# Patient Record
Sex: Male | Born: 1940
Health system: Southern US, Community
[De-identification: ages and names within clinical notes are randomized; demographics above are authoritative.]

## PROBLEM LIST (undated history)

## (undated) DIAGNOSIS — F419 Anxiety disorder, unspecified: Secondary | ICD-10-CM

## (undated) DIAGNOSIS — K5790 Diverticulosis of intestine, part unspecified, without perforation or abscess without bleeding: Secondary | ICD-10-CM

## (undated) DIAGNOSIS — H9319 Tinnitus, unspecified ear: Secondary | ICD-10-CM

## (undated) DIAGNOSIS — I951 Orthostatic hypotension: Secondary | ICD-10-CM

## (undated) HISTORY — DX: Orthostatic hypotension: I95.1

## (undated) HISTORY — DX: Anxiety disorder, unspecified: F41.9

## (undated) HISTORY — DX: Diverticulosis of intestine, part unspecified, without perforation or abscess without bleeding: K57.90

## (undated) HISTORY — DX: Tinnitus, unspecified ear: H93.19

## (undated) HISTORY — PX: TONSILLECTOMY: SUR1361

---

## 2011-01-05 DIAGNOSIS — Z0000 Encounter for general adult medical examination without abnormal findings: Secondary | ICD-10-CM | POA: Diagnosis not present

## 2011-01-05 DIAGNOSIS — K219 Gastro-esophageal reflux disease without esophagitis: Secondary | ICD-10-CM | POA: Diagnosis not present

## 2011-05-09 DIAGNOSIS — H40019 Open angle with borderline findings, low risk, unspecified eye: Secondary | ICD-10-CM | POA: Diagnosis not present

## 2011-05-09 DIAGNOSIS — H35369 Drusen (degenerative) of macula, unspecified eye: Secondary | ICD-10-CM | POA: Diagnosis not present

## 2011-09-28 DIAGNOSIS — Z23 Encounter for immunization: Secondary | ICD-10-CM | POA: Diagnosis not present

## 2012-01-04 DIAGNOSIS — Z85828 Personal history of other malignant neoplasm of skin: Secondary | ICD-10-CM | POA: Diagnosis not present

## 2012-01-04 DIAGNOSIS — L570 Actinic keratosis: Secondary | ICD-10-CM | POA: Diagnosis not present

## 2012-01-07 ENCOUNTER — Other Ambulatory Visit: Payer: Self-pay | Admitting: Geriatric Medicine

## 2012-01-07 DIAGNOSIS — Z0000 Encounter for general adult medical examination without abnormal findings: Secondary | ICD-10-CM | POA: Diagnosis not present

## 2012-01-07 DIAGNOSIS — R1310 Dysphagia, unspecified: Secondary | ICD-10-CM | POA: Diagnosis not present

## 2012-01-07 DIAGNOSIS — Z136 Encounter for screening for cardiovascular disorders: Secondary | ICD-10-CM | POA: Diagnosis not present

## 2012-01-07 DIAGNOSIS — Z79899 Other long term (current) drug therapy: Secondary | ICD-10-CM | POA: Diagnosis not present

## 2012-01-07 DIAGNOSIS — Z1331 Encounter for screening for depression: Secondary | ICD-10-CM | POA: Diagnosis not present

## 2012-01-09 ENCOUNTER — Ambulatory Visit
Admission: RE | Admit: 2012-01-09 | Discharge: 2012-01-09 | Disposition: A | Discharge status: Home / Self Care | Payer: Medicare Other | Source: Ambulatory Visit | Attending: Geriatric Medicine | Admitting: Geriatric Medicine

## 2012-01-09 DIAGNOSIS — K225 Diverticulum of esophagus, acquired: Secondary | ICD-10-CM | POA: Diagnosis not present

## 2012-01-09 DIAGNOSIS — K449 Diaphragmatic hernia without obstruction or gangrene: Secondary | ICD-10-CM | POA: Diagnosis not present

## 2012-01-09 DIAGNOSIS — R1310 Dysphagia, unspecified: Secondary | ICD-10-CM

## 2012-02-07 DIAGNOSIS — H40019 Open angle with borderline findings, low risk, unspecified eye: Secondary | ICD-10-CM | POA: Diagnosis not present

## 2012-02-21 ENCOUNTER — Other Ambulatory Visit: Payer: Self-pay | Admitting: Gastroenterology

## 2012-02-26 ENCOUNTER — Encounter (HOSPITAL_COMMUNITY): Payer: Self-pay

## 2012-02-26 ENCOUNTER — Encounter (HOSPITAL_COMMUNITY)
Admission: RE | Disposition: A | Discharge status: Home / Self Care | Payer: Self-pay | Source: Ambulatory Visit | Attending: Gastroenterology

## 2012-02-26 ENCOUNTER — Ambulatory Visit (HOSPITAL_COMMUNITY)
Admission: RE | Admit: 2012-02-26 | Discharge: 2012-02-26 | Disposition: A | Discharge status: Home / Self Care | Payer: Medicare Other | Source: Ambulatory Visit | Attending: Gastroenterology | Admitting: Gastroenterology

## 2012-02-26 DIAGNOSIS — K449 Diaphragmatic hernia without obstruction or gangrene: Secondary | ICD-10-CM | POA: Diagnosis not present

## 2012-02-26 DIAGNOSIS — K222 Esophageal obstruction: Secondary | ICD-10-CM | POA: Insufficient documentation

## 2012-02-26 DIAGNOSIS — K225 Diverticulum of esophagus, acquired: Secondary | ICD-10-CM | POA: Insufficient documentation

## 2012-02-26 HISTORY — PX: ESOPHAGOGASTRODUODENOSCOPY: SHX5428

## 2012-02-26 HISTORY — PX: BALLOON DILATION: SHX5330

## 2012-02-26 SURGERY — EGD (ESOPHAGOGASTRODUODENOSCOPY)
Anesthesia: Moderate Sedation

## 2012-02-26 MED ORDER — SODIUM CHLORIDE 0.9 % IV SOLN
INTRAVENOUS | Status: DC
  Administered 2012-02-26: 500 mL via INTRAVENOUS

## 2012-02-26 MED ORDER — MIDAZOLAM HCL 10 MG/2ML IJ SOLN
INTRAMUSCULAR | Status: DC | PRN
  Administered 2012-02-26 (×4): 2.5 mg via INTRAVENOUS

## 2012-02-26 MED ORDER — BUTAMBEN-TETRACAINE-BENZOCAINE 2-2-14 % EX AERO
INHALATION_SPRAY | CUTANEOUS | Status: DC | PRN
  Administered 2012-02-26: 2 via TOPICAL

## 2012-02-26 MED ORDER — FENTANYL CITRATE 0.05 MG/ML IJ SOLN
INTRAMUSCULAR | Status: AC
  Filled 2012-02-26: qty 2

## 2012-02-26 MED ORDER — FENTANYL CITRATE 0.05 MG/ML IJ SOLN
INTRAMUSCULAR | Status: DC | PRN
  Administered 2012-02-26: 25 ug via INTRAVENOUS
  Administered 2012-02-26: 50 ug via INTRAVENOUS

## 2012-02-26 MED ORDER — MIDAZOLAM HCL 10 MG/2ML IJ SOLN
INTRAMUSCULAR | Status: AC
  Filled 2012-02-26: qty 2

## 2012-02-26 NOTE — H&P (Signed)
Problem: Esophageal dysphagia associated with a distal esophageal stricture  History: The patient is a 72 year old male born 1940-05-29. The patient underwent a barium esophagram and barium tablet swallow to evaluate intermittent solid food dysphagia unassociated with odynophagia. His barium esophagram showed a large hiatal hernia, distal esophageal diverticulosis, and a distal esophageal stricture at the esophagogastric junction proximal to the large hiatal hernia.  The patient is scheduled to undergo a diagnostic esophagogastroduodenoscopy with balloon dilation of the distal esophageal stricture.   Medication allergies: None  Past medical history: Normal screening colonoscopy in 2009. Chronic anxiety. Tinnitus associated with hearing loss. Colonic diverticulosis. Tonsillectomy. Cyst removal from his scalp.  Chronic medications: Acetaminophen. Multivitamin.  Habits: The patient does not smoke cigarettes and consumes alcohol in moderation.  Plan: Proceed with diagnostic esophagogastroduodenoscopy and dilation of the distal esophageal stricture.

## 2012-02-26 NOTE — Op Note (Signed)
Procedure: Diagnostic esophagogastroduodenoscopy with balloon dilation of a benign stricture at the esophagogastric junction.  Endoscopist: Danise Edge  Premedication: Fentanyl 75 mcg intravenously. Versed 10 mg intravenously.  Indication: Dysphagia. Barium esophagram showed extensive distal esophageal diverticulosis, a large hiatal hernia, and a short stricture at the esophagogastric junction. The barium tablet would not traverse the distal esophageal stricture.  Procedure: The patient was placed in the left lateral decubitus position. The Pentax gastroscope was passed through the posterior hypopharynx into the proximal esophagus without difficulty. The hypopharynx, larynx, and vocal cords were not visualized.  Esophagoscopy: The proximal and mid segments of the esophageal mucosa appear normal. There are multiple large diverticula in the distal esophagus making it very difficult to identify the esophagogastric junction. There is no endoscopic evidence for the presence of erosive esophagitis or Barrett's esophagus. The esophagogastric junction is noted at approximately 40 cm from the incisor teeth associated with a short segment benign distal esophageal stricture. Using the CRE esophageal balloon dilator, the benign stricture at the esophagogastric junction was dilated to 16.5 millimeters without apparent complications.  Gastroscopy: There is a large hiatal hernia. Retroflexed view of the gastric cardia and fundus was normal. The gastric body, antrum, and pylorus appeared normal.  Duodenoscopy: The duodenal bulb and descending duodenum appeared normal.  Assessment:  #1. Extensive distal esophageal diverticulosis  #2. Large hiatal hernia  #3. Benign stricture at the esophagogastric junction dilated to 16.5 mm using the CRE esophageal balloon dilator.  Recommendations: The patient should start taking omeprazole 20 mg before breakfast each morning indefinitely.

## 2012-02-27 ENCOUNTER — Encounter (HOSPITAL_COMMUNITY): Payer: Self-pay | Admitting: Gastroenterology

## 2012-04-15 DIAGNOSIS — R55 Syncope and collapse: Secondary | ICD-10-CM | POA: Diagnosis not present

## 2012-04-23 DIAGNOSIS — R9431 Abnormal electrocardiogram [ECG] [EKG]: Secondary | ICD-10-CM | POA: Diagnosis not present

## 2012-04-23 DIAGNOSIS — R030 Elevated blood-pressure reading, without diagnosis of hypertension: Secondary | ICD-10-CM | POA: Diagnosis not present

## 2012-04-23 DIAGNOSIS — R55 Syncope and collapse: Secondary | ICD-10-CM | POA: Diagnosis not present

## 2012-05-06 DIAGNOSIS — Z79899 Other long term (current) drug therapy: Secondary | ICD-10-CM | POA: Diagnosis not present

## 2012-05-29 DIAGNOSIS — M25569 Pain in unspecified knee: Secondary | ICD-10-CM | POA: Diagnosis not present

## 2012-06-03 DIAGNOSIS — H40019 Open angle with borderline findings, low risk, unspecified eye: Secondary | ICD-10-CM | POA: Diagnosis not present

## 2012-09-29 DIAGNOSIS — Z23 Encounter for immunization: Secondary | ICD-10-CM | POA: Diagnosis not present

## 2013-01-07 DIAGNOSIS — Z0000 Encounter for general adult medical examination without abnormal findings: Secondary | ICD-10-CM | POA: Diagnosis not present

## 2013-01-07 DIAGNOSIS — Z1331 Encounter for screening for depression: Secondary | ICD-10-CM | POA: Diagnosis not present

## 2013-01-07 DIAGNOSIS — Z79899 Other long term (current) drug therapy: Secondary | ICD-10-CM | POA: Diagnosis not present

## 2013-01-07 DIAGNOSIS — I951 Orthostatic hypotension: Secondary | ICD-10-CM | POA: Diagnosis not present

## 2013-01-07 DIAGNOSIS — K219 Gastro-esophageal reflux disease without esophagitis: Secondary | ICD-10-CM | POA: Diagnosis not present

## 2013-01-07 DIAGNOSIS — R5381 Other malaise: Secondary | ICD-10-CM | POA: Diagnosis not present

## 2013-01-07 DIAGNOSIS — R5383 Other fatigue: Secondary | ICD-10-CM | POA: Diagnosis not present

## 2013-02-05 DIAGNOSIS — H40029 Open angle with borderline findings, high risk, unspecified eye: Secondary | ICD-10-CM | POA: Diagnosis not present

## 2013-06-22 DIAGNOSIS — H40029 Open angle with borderline findings, high risk, unspecified eye: Secondary | ICD-10-CM | POA: Diagnosis not present

## 2013-09-09 DIAGNOSIS — N183 Chronic kidney disease, stage 3 unspecified: Secondary | ICD-10-CM | POA: Diagnosis not present

## 2013-09-09 DIAGNOSIS — I951 Orthostatic hypotension: Secondary | ICD-10-CM | POA: Diagnosis not present

## 2013-09-09 DIAGNOSIS — D539 Nutritional anemia, unspecified: Secondary | ICD-10-CM | POA: Diagnosis not present

## 2013-09-10 DIAGNOSIS — C44319 Basal cell carcinoma of skin of other parts of face: Secondary | ICD-10-CM | POA: Diagnosis not present

## 2013-09-10 DIAGNOSIS — D485 Neoplasm of uncertain behavior of skin: Secondary | ICD-10-CM | POA: Diagnosis not present

## 2013-09-10 DIAGNOSIS — Z85828 Personal history of other malignant neoplasm of skin: Secondary | ICD-10-CM | POA: Diagnosis not present

## 2013-09-10 DIAGNOSIS — L821 Other seborrheic keratosis: Secondary | ICD-10-CM | POA: Diagnosis not present

## 2013-09-10 DIAGNOSIS — D1801 Hemangioma of skin and subcutaneous tissue: Secondary | ICD-10-CM | POA: Diagnosis not present

## 2013-10-02 DIAGNOSIS — I951 Orthostatic hypotension: Secondary | ICD-10-CM | POA: Diagnosis not present

## 2013-10-19 DIAGNOSIS — C44311 Basal cell carcinoma of skin of nose: Secondary | ICD-10-CM | POA: Diagnosis not present

## 2013-10-19 DIAGNOSIS — Z85828 Personal history of other malignant neoplasm of skin: Secondary | ICD-10-CM | POA: Diagnosis not present

## 2013-12-30 ENCOUNTER — Encounter: Payer: Self-pay | Admitting: *Deleted

## 2014-01-08 DIAGNOSIS — I951 Orthostatic hypotension: Secondary | ICD-10-CM | POA: Diagnosis not present

## 2014-01-08 DIAGNOSIS — Z1389 Encounter for screening for other disorder: Secondary | ICD-10-CM | POA: Diagnosis not present

## 2014-01-08 DIAGNOSIS — F419 Anxiety disorder, unspecified: Secondary | ICD-10-CM | POA: Diagnosis not present

## 2014-01-08 DIAGNOSIS — Z23 Encounter for immunization: Secondary | ICD-10-CM | POA: Diagnosis not present

## 2014-01-08 DIAGNOSIS — H9193 Unspecified hearing loss, bilateral: Secondary | ICD-10-CM | POA: Diagnosis not present

## 2014-01-08 DIAGNOSIS — N183 Chronic kidney disease, stage 3 (moderate): Secondary | ICD-10-CM | POA: Diagnosis not present

## 2014-01-08 DIAGNOSIS — D649 Anemia, unspecified: Secondary | ICD-10-CM | POA: Diagnosis not present

## 2014-01-08 DIAGNOSIS — Z0000 Encounter for general adult medical examination without abnormal findings: Secondary | ICD-10-CM | POA: Diagnosis not present

## 2014-01-08 DIAGNOSIS — Z79899 Other long term (current) drug therapy: Secondary | ICD-10-CM | POA: Diagnosis not present

## 2014-01-13 DIAGNOSIS — H903 Sensorineural hearing loss, bilateral: Secondary | ICD-10-CM | POA: Diagnosis not present

## 2014-01-13 DIAGNOSIS — H9313 Tinnitus, bilateral: Secondary | ICD-10-CM | POA: Diagnosis not present

## 2014-02-02 DIAGNOSIS — H40023 Open angle with borderline findings, high risk, bilateral: Secondary | ICD-10-CM | POA: Diagnosis not present

## 2014-03-10 DIAGNOSIS — Z79899 Other long term (current) drug therapy: Secondary | ICD-10-CM | POA: Diagnosis not present

## 2014-04-20 DIAGNOSIS — Z85828 Personal history of other malignant neoplasm of skin: Secondary | ICD-10-CM | POA: Diagnosis not present

## 2014-07-09 DIAGNOSIS — Z79899 Other long term (current) drug therapy: Secondary | ICD-10-CM | POA: Diagnosis not present

## 2014-07-09 DIAGNOSIS — I951 Orthostatic hypotension: Secondary | ICD-10-CM | POA: Diagnosis not present

## 2014-07-09 DIAGNOSIS — N183 Chronic kidney disease, stage 3 (moderate): Secondary | ICD-10-CM | POA: Diagnosis not present

## 2014-07-09 DIAGNOSIS — D72823 Leukemoid reaction: Secondary | ICD-10-CM | POA: Diagnosis not present

## 2014-07-12 DIAGNOSIS — H40023 Open angle with borderline findings, high risk, bilateral: Secondary | ICD-10-CM | POA: Diagnosis not present

## 2014-08-02 DIAGNOSIS — H4011X1 Primary open-angle glaucoma, mild stage: Secondary | ICD-10-CM | POA: Diagnosis not present

## 2014-10-08 DIAGNOSIS — Z23 Encounter for immunization: Secondary | ICD-10-CM | POA: Diagnosis not present

## 2015-02-11 DIAGNOSIS — Z79899 Other long term (current) drug therapy: Secondary | ICD-10-CM | POA: Diagnosis not present

## 2015-02-11 DIAGNOSIS — Z1389 Encounter for screening for other disorder: Secondary | ICD-10-CM | POA: Diagnosis not present

## 2015-02-11 DIAGNOSIS — Z0000 Encounter for general adult medical examination without abnormal findings: Secondary | ICD-10-CM | POA: Diagnosis not present

## 2015-02-11 DIAGNOSIS — I951 Orthostatic hypotension: Secondary | ICD-10-CM | POA: Diagnosis not present

## 2015-02-11 DIAGNOSIS — N183 Chronic kidney disease, stage 3 (moderate): Secondary | ICD-10-CM | POA: Diagnosis not present

## 2015-02-11 DIAGNOSIS — R7309 Other abnormal glucose: Secondary | ICD-10-CM | POA: Diagnosis not present

## 2015-02-11 DIAGNOSIS — D72823 Leukemoid reaction: Secondary | ICD-10-CM | POA: Diagnosis not present

## 2015-03-15 DIAGNOSIS — H401112 Primary open-angle glaucoma, right eye, moderate stage: Secondary | ICD-10-CM | POA: Diagnosis not present

## 2015-03-15 DIAGNOSIS — H401121 Primary open-angle glaucoma, left eye, mild stage: Secondary | ICD-10-CM | POA: Diagnosis not present

## 2015-04-28 DIAGNOSIS — Z85828 Personal history of other malignant neoplasm of skin: Secondary | ICD-10-CM | POA: Diagnosis not present

## 2015-04-28 DIAGNOSIS — D2262 Melanocytic nevi of left upper limb, including shoulder: Secondary | ICD-10-CM | POA: Diagnosis not present

## 2015-04-28 DIAGNOSIS — L570 Actinic keratosis: Secondary | ICD-10-CM | POA: Diagnosis not present

## 2015-04-28 DIAGNOSIS — D2261 Melanocytic nevi of right upper limb, including shoulder: Secondary | ICD-10-CM | POA: Diagnosis not present

## 2015-04-28 DIAGNOSIS — L738 Other specified follicular disorders: Secondary | ICD-10-CM | POA: Diagnosis not present

## 2015-04-28 DIAGNOSIS — L821 Other seborrheic keratosis: Secondary | ICD-10-CM | POA: Diagnosis not present

## 2015-04-29 ENCOUNTER — Encounter (HOSPITAL_COMMUNITY): Payer: Self-pay | Admitting: Emergency Medicine

## 2015-04-29 ENCOUNTER — Emergency Department (HOSPITAL_COMMUNITY): Payer: Medicare Other

## 2015-04-29 ENCOUNTER — Other Ambulatory Visit: Payer: Self-pay | Admitting: Geriatric Medicine

## 2015-04-29 ENCOUNTER — Inpatient Hospital Stay (HOSPITAL_COMMUNITY)
Admission: EM | Admit: 2015-04-29 | Discharge: 2015-05-05 | DRG: 409 | Disposition: A | Discharge status: Home / Self Care | Payer: Medicare Other | Attending: Internal Medicine | Admitting: Internal Medicine

## 2015-04-29 DIAGNOSIS — I951 Orthostatic hypotension: Secondary | ICD-10-CM | POA: Diagnosis present

## 2015-04-29 DIAGNOSIS — Z419 Encounter for procedure for purposes other than remedying health state, unspecified: Secondary | ICD-10-CM

## 2015-04-29 DIAGNOSIS — Z87891 Personal history of nicotine dependence: Secondary | ICD-10-CM | POA: Diagnosis not present

## 2015-04-29 DIAGNOSIS — K8064 Calculus of gallbladder and bile duct with chronic cholecystitis without obstruction: Secondary | ICD-10-CM | POA: Diagnosis not present

## 2015-04-29 DIAGNOSIS — F419 Anxiety disorder, unspecified: Secondary | ICD-10-CM | POA: Diagnosis present

## 2015-04-29 DIAGNOSIS — R8271 Bacteriuria: Secondary | ICD-10-CM | POA: Diagnosis not present

## 2015-04-29 DIAGNOSIS — Z823 Family history of stroke: Secondary | ICD-10-CM

## 2015-04-29 DIAGNOSIS — K5730 Diverticulosis of large intestine without perforation or abscess without bleeding: Secondary | ICD-10-CM | POA: Diagnosis present

## 2015-04-29 DIAGNOSIS — K8020 Calculus of gallbladder without cholecystitis without obstruction: Secondary | ICD-10-CM

## 2015-04-29 DIAGNOSIS — Z7952 Long term (current) use of systemic steroids: Secondary | ICD-10-CM

## 2015-04-29 DIAGNOSIS — H409 Unspecified glaucoma: Secondary | ICD-10-CM | POA: Diagnosis present

## 2015-04-29 DIAGNOSIS — E876 Hypokalemia: Secondary | ICD-10-CM | POA: Diagnosis not present

## 2015-04-29 DIAGNOSIS — R1013 Epigastric pain: Secondary | ICD-10-CM | POA: Diagnosis not present

## 2015-04-29 DIAGNOSIS — H9319 Tinnitus, unspecified ear: Secondary | ICD-10-CM | POA: Diagnosis present

## 2015-04-29 DIAGNOSIS — K222 Esophageal obstruction: Secondary | ICD-10-CM | POA: Diagnosis present

## 2015-04-29 DIAGNOSIS — D6489 Other specified anemias: Secondary | ICD-10-CM | POA: Diagnosis not present

## 2015-04-29 DIAGNOSIS — Z66 Do not resuscitate: Secondary | ICD-10-CM | POA: Diagnosis present

## 2015-04-29 DIAGNOSIS — Z79899 Other long term (current) drug therapy: Secondary | ICD-10-CM

## 2015-04-29 DIAGNOSIS — K219 Gastro-esophageal reflux disease without esophagitis: Secondary | ICD-10-CM | POA: Diagnosis present

## 2015-04-29 DIAGNOSIS — K449 Diaphragmatic hernia without obstruction or gangrene: Secondary | ICD-10-CM | POA: Diagnosis present

## 2015-04-29 DIAGNOSIS — Q396 Congenital diverticulum of esophagus: Secondary | ICD-10-CM | POA: Diagnosis not present

## 2015-04-29 DIAGNOSIS — K8050 Calculus of bile duct without cholangitis or cholecystitis without obstruction: Secondary | ICD-10-CM

## 2015-04-29 DIAGNOSIS — K859 Acute pancreatitis without necrosis or infection, unspecified: Secondary | ICD-10-CM | POA: Diagnosis present

## 2015-04-29 DIAGNOSIS — R740 Nonspecific elevation of levels of transaminase and lactic acid dehydrogenase [LDH]: Secondary | ICD-10-CM | POA: Diagnosis present

## 2015-04-29 DIAGNOSIS — D649 Anemia, unspecified: Secondary | ICD-10-CM | POA: Insufficient documentation

## 2015-04-29 DIAGNOSIS — K851 Biliary acute pancreatitis without necrosis or infection: Secondary | ICD-10-CM | POA: Diagnosis not present

## 2015-04-29 LAB — CBC
HCT: 37 % — ABNORMAL LOW (ref 39.0–52.0)
Hemoglobin: 12.6 g/dL — ABNORMAL LOW (ref 13.0–17.0)
MCH: 31.1 pg (ref 26.0–34.0)
MCHC: 34.1 g/dL (ref 30.0–36.0)
MCV: 91.4 fL (ref 78.0–100.0)
PLATELETS: 190 10*3/uL (ref 150–400)
RBC: 4.05 MIL/uL — AB (ref 4.22–5.81)
RDW: 13 % (ref 11.5–15.5)
WBC: 15.1 10*3/uL — ABNORMAL HIGH (ref 4.0–10.5)

## 2015-04-29 LAB — COMPREHENSIVE METABOLIC PANEL
ALK PHOS: 396 U/L — AB (ref 38–126)
ALT: 191 U/L — AB (ref 17–63)
AST: 100 U/L — AB (ref 15–41)
Albumin: 4 g/dL (ref 3.5–5.0)
Anion gap: 13 (ref 5–15)
BUN: 24 mg/dL — AB (ref 6–20)
CALCIUM: 9 mg/dL (ref 8.9–10.3)
CO2: 23 mmol/L (ref 22–32)
CREATININE: 1.21 mg/dL (ref 0.61–1.24)
Chloride: 104 mmol/L (ref 101–111)
GFR, EST NON AFRICAN AMERICAN: 58 mL/min — AB (ref >60)
Glucose, Bld: 134 mg/dL — ABNORMAL HIGH (ref 65–99)
Potassium: 3.6 mmol/L (ref 3.5–5.1)
SODIUM: 140 mmol/L (ref 135–145)
Total Bilirubin: 5 mg/dL — ABNORMAL HIGH (ref 0.3–1.2)
Total Protein: 7 g/dL (ref 6.5–8.1)

## 2015-04-29 LAB — URINALYSIS, ROUTINE W REFLEX MICROSCOPIC
Glucose, UA: NEGATIVE mg/dL
HGB URINE DIPSTICK: NEGATIVE
KETONES UR: 15 mg/dL — AB
Nitrite: POSITIVE — AB
PROTEIN: 30 mg/dL — AB
Specific Gravity, Urine: 1.034 — ABNORMAL HIGH (ref 1.005–1.030)
pH: 5.5 (ref 5.0–8.0)

## 2015-04-29 LAB — URINE MICROSCOPIC-ADD ON: Squamous Epithelial / LPF: NONE SEEN

## 2015-04-29 LAB — LIPASE, BLOOD: LIPASE: 983 U/L — AB (ref 11–51)

## 2015-04-29 MED ORDER — SODIUM CHLORIDE 0.9 % IV BOLUS (SEPSIS)
1000.0000 mL | Freq: Once | INTRAVENOUS | Status: AC
  Administered 2015-04-29: 1000 mL via INTRAVENOUS

## 2015-04-29 MED ORDER — DIATRIZOATE MEGLUMINE & SODIUM 66-10 % PO SOLN
15.0000 mL | Freq: Once | ORAL | Status: AC
  Administered 2015-04-29: 30 mL via ORAL

## 2015-04-29 MED ORDER — IOPAMIDOL (ISOVUE-300) INJECTION 61%
100.0000 mL | Freq: Once | INTRAVENOUS | Status: AC | PRN
Start: 2015-04-29 — End: 2015-04-29
  Administered 2015-04-29: 100 mL via INTRAVENOUS

## 2015-04-29 MED ORDER — PIPERACILLIN-TAZOBACTAM 3.375 G IVPB 30 MIN
3.3750 g | Freq: Once | INTRAVENOUS | Status: AC
  Administered 2015-04-29: 3.375 g via INTRAVENOUS
  Filled 2015-04-29: qty 50

## 2015-04-29 NOTE — ED Provider Notes (Signed)
CSN: [id number]     Arrival date & time 04/29/15  1810 History   First MD Initiated Contact with Patient 04/29/15 2145     Chief Complaint  Patient presents with  . Abdominal Pain     The history is provided by the patient. No language interpreter was used.   Bobby [name] is a 75 y.o. male who presents to the Emergency Department complaining of abdominal pain.  He's had 2 weeks of intermittent abdominal pain, worse over the last couple of days. Pain is predominantly across the upper abdomen. He's had shaking chills and nausea. He saw his PCP today and had blood work drawn and was told to present to the emergency department for further evaluation. No reports of fevers, diarrhea, dysuria, vomiting.  Past Medical History  Diagnosis Date  . Anxiety   . Tinnitus   . Diverticulosis   . Orthostatic hypotension    Past Surgical History  Procedure Laterality Date  . Tonsillectomy    . Esophagogastroduodenoscopy N/A 02/26/2012    Procedure: ESOPHAGOGASTRODUODENOSCOPY (EGD);  Surgeon: Garlan Fair, MD;  Location: [facility];  Service: Endoscopy;  Laterality: N/A;  . Balloon dilation N/A 02/26/2012    Procedure: BALLOON DILATION;  Surgeon: Garlan Fair, MD;  Location: [facility];  Service: Endoscopy;  Laterality: N/A;   Family History  Problem Relation Age of Onset  . Cancer Mother    Social History  Substance Use Topics  . Smoking status: Former Research scientist (life sciences)  . Smokeless tobacco: None  . Alcohol Use: 1.8 oz/week    3 Shots of liquor per week     Comment: 3 drinks per day    Review of Systems  All other systems reviewed and are negative.     Allergies  Review of patient's allergies indicates no known allergies.  Home Medications   Prior to Admission medications   Medication Sig Start Date End Date Taking? Authorizing Provider  acetaminophen (TYLENOL) 500 MG tablet Take 1,000-1,500 mg by mouth 2 (two) times daily.    Yes Historical Provider, MD  cholecalciferol (VITAMIN  D) 1000 units tablet Take 1,000 Units by mouth daily.   Yes Historical Provider, MD  fludrocortisone (FLORINEF) 0.1 MG tablet Take 0.1 mg by mouth every morning. 03/30/15  Yes Historical Provider, MD  Multiple Vitamin (MULTIVITAMIN WITH MINERALS) TABS tablet Take 1 tablet by mouth daily.   Yes Historical Provider, MD  omeprazole (PRILOSEC) 20 MG capsule Take 20 mg by mouth daily with breakfast. 04/25/15  Yes Historical Provider, MD  travoprost, benzalkonium, (TRAVATAN) 0.004 % ophthalmic solution Place 1 drop into both eyes at bedtime.   Yes Historical Provider, MD   BP 162/76 mmHg  Pulse 58  Temp(Src) 98.7 F (37.1 C) (Oral)  Resp 20  SpO2 99% Physical Exam  Constitutional: He is oriented to person, place, and time. He appears well-developed and well-nourished.  HENT:  Head: Normocephalic and atraumatic.  Eyes: Scleral icterus is present.  Cardiovascular: Normal rate and regular rhythm.   No murmur heard. Pulmonary/Chest: Effort normal and breath sounds normal. No respiratory distress.  Abdominal: Soft. There is no tenderness. There is no rebound and no guarding.  Musculoskeletal: He exhibits no edema or tenderness.  Neurological: He is alert and oriented to person, place, and time.  Skin: Skin is warm and dry.  Psychiatric: He has a normal mood and affect. His behavior is normal.  Nursing note and vitals reviewed.   ED Course  Procedures (including critical care time) Labs Review Labs Reviewed  COMPREHENSIVE METABOLIC PANEL - Abnormal; Notable for the following:    Glucose, Bld 134 (*)    BUN 24 (*)    AST 100 (*)    ALT 191 (*)    Alkaline Phosphatase 396 (*)    Total Bilirubin 5.0 (*)    GFR calc non Af Amer 58 (*)    All other components within normal limits  CBC - Abnormal; Notable for the following:    WBC 15.1 (*)    RBC 4.05 (*)    Hemoglobin 12.6 (*)    HCT 37.0 (*)    All other components within normal limits  URINALYSIS, ROUTINE W REFLEX MICROSCOPIC (NOT AT  [facility]) - Abnormal; Notable for the following:    Color, Urine ORANGE (*)    APPearance TURBID (*)    Specific Gravity, Urine 1.034 (*)    Bilirubin Urine LARGE (*)    Ketones, ur 15 (*)    Protein, ur 30 (*)    Nitrite POSITIVE (*)    Leukocytes, UA SMALL (*)    All other components within normal limits  LIPASE, BLOOD - Abnormal; Notable for the following:    Lipase 983 (*)    All other components within normal limits  URINE MICROSCOPIC-ADD ON - Abnormal; Notable for the following:    Bacteria, UA FEW (*)    All other components within normal limits    Imaging Review Ct Abdomen Pelvis W Contrast  04/29/2015  CLINICAL DATA:  Subacute onset of generalized abdominal pain. Initial encounter. EXAM: CT ABDOMEN AND PELVIS WITH CONTRAST TECHNIQUE: Multidetector CT imaging of the abdomen and pelvis was performed using the standard protocol following bolus administration of intravenous contrast. CONTRAST:  138mL ISOVUE-300 IOPAMIDOL (ISOVUE-300) INJECTION 61% COMPARISON:  None. FINDINGS: The visualized lung bases are clear. There appears to be a moderate diverticulum arising from a small hiatal hernia. There is mild intrahepatic biliary duct dilatation. Multiple large stones are seen within the gallbladder. There is dilatation of the common bile duct to 1.3 cm, with 3 stones seen in the common bile duct, concerning for obstruction. The pancreas and adrenal glands are unremarkable. The spleen is grossly unremarkable in appearance. The kidneys are unremarkable in appearance. There is no evidence of hydronephrosis. No renal or ureteral stones are seen. Mild nonspecific perinephric stranding is noted bilaterally. No free fluid is identified. The small bowel is unremarkable in appearance. The stomach is within normal limits. No acute vascular abnormalities are seen. Minimal calcification is seen along the abdominal aorta and its branches. The appendix is normal in caliber and contains trace air, without evidence  of appendicitis. Diffuse diverticulosis is noted along the descending and sigmoid colon, without evidence of diverticulitis. The bladder is mildly distended. Mild bladder wall thickening could reflect cystitis. The prostate remains normal in size, with minimal calcification. No inguinal lymphadenopathy is seen. No acute osseous abnormalities are identified. Mild vacuum phenomenon is noted at L4-L5. IMPRESSION: 1. Dilatation of the common bile duct to 1.3 cm, with 3 stones seen in the common bile duct, concerning for obstruction. ERCP would be helpful for further evaluation, as deemed clinically appropriate. 2. Multiple large stones seen within the gallbladder. Mild intrahepatic biliary ductal dilatation seen. 3. Mild bladder wall thickening could reflect cystitis. Would correlate with the patient's symptoms. 4. Apparent moderate diverticulum seen arising from a small hiatal hernia. 5. Diffuse diverticulosis along the descending and sigmoid colon, without evidence of diverticulitis. Electronically Signed   By: Garald Balding M.D.   On:  04/29/2015 23:38   I have personally reviewed and evaluated these images and lab results as part of my medical decision-making.   EKG Interpretation None      MDM   Final diagnoses:  Acute gallstone pancreatitis  Gallstones    Patient here for progressive abdominal pain, chills. His abdominal pain is waxing and waning and has known department on evaluation. Labs are consistent with pancreatitis. CT abdomen demonstrates multiple obstructing stones. Patient was started on Zosyn for his shaking chills and leukocytosis, concerning for potential infection. Discussed with Dr. Bobbe Medico with [facility] who agrees to see the patient in consult. Discussed with hospitalist regarding admission for further treatment.    Quintella Reichert, MD 04/30/15 [phone]

## 2015-04-29 NOTE — ED Notes (Signed)
Per pt, states abdominal pain on and off for 2 weeks-went to PCP and was told his blood work was terrible

## 2015-04-30 ENCOUNTER — Encounter (HOSPITAL_COMMUNITY): Payer: Self-pay | Admitting: Internal Medicine

## 2015-04-30 ENCOUNTER — Emergency Department (HOSPITAL_COMMUNITY): Payer: Medicare Other

## 2015-04-30 DIAGNOSIS — K5730 Diverticulosis of large intestine without perforation or abscess without bleeding: Secondary | ICD-10-CM | POA: Diagnosis present

## 2015-04-30 DIAGNOSIS — K449 Diaphragmatic hernia without obstruction or gangrene: Secondary | ICD-10-CM | POA: Diagnosis present

## 2015-04-30 DIAGNOSIS — K8020 Calculus of gallbladder without cholecystitis without obstruction: Secondary | ICD-10-CM | POA: Diagnosis not present

## 2015-04-30 DIAGNOSIS — I951 Orthostatic hypotension: Secondary | ICD-10-CM | POA: Diagnosis not present

## 2015-04-30 DIAGNOSIS — R1013 Epigastric pain: Secondary | ICD-10-CM | POA: Diagnosis not present

## 2015-04-30 DIAGNOSIS — E876 Hypokalemia: Secondary | ICD-10-CM | POA: Diagnosis not present

## 2015-04-30 DIAGNOSIS — D6489 Other specified anemias: Secondary | ICD-10-CM | POA: Diagnosis not present

## 2015-04-30 DIAGNOSIS — K859 Acute pancreatitis without necrosis or infection, unspecified: Secondary | ICD-10-CM | POA: Diagnosis present

## 2015-04-30 DIAGNOSIS — R740 Nonspecific elevation of levels of transaminase and lactic acid dehydrogenase [LDH]: Secondary | ICD-10-CM | POA: Diagnosis present

## 2015-04-30 DIAGNOSIS — R1084 Generalized abdominal pain: Secondary | ICD-10-CM | POA: Diagnosis not present

## 2015-04-30 DIAGNOSIS — K8040 Calculus of bile duct with cholecystitis, unspecified, without obstruction: Secondary | ICD-10-CM | POA: Diagnosis not present

## 2015-04-30 DIAGNOSIS — K851 Biliary acute pancreatitis without necrosis or infection: Secondary | ICD-10-CM | POA: Diagnosis not present

## 2015-04-30 DIAGNOSIS — Z7952 Long term (current) use of systemic steroids: Secondary | ICD-10-CM | POA: Diagnosis not present

## 2015-04-30 DIAGNOSIS — Z79899 Other long term (current) drug therapy: Secondary | ICD-10-CM | POA: Diagnosis not present

## 2015-04-30 DIAGNOSIS — K5790 Diverticulosis of intestine, part unspecified, without perforation or abscess without bleeding: Secondary | ICD-10-CM | POA: Diagnosis not present

## 2015-04-30 DIAGNOSIS — K8010 Calculus of gallbladder with chronic cholecystitis without obstruction: Secondary | ICD-10-CM | POA: Diagnosis not present

## 2015-04-30 DIAGNOSIS — D72829 Elevated white blood cell count, unspecified: Secondary | ICD-10-CM | POA: Diagnosis not present

## 2015-04-30 DIAGNOSIS — F419 Anxiety disorder, unspecified: Secondary | ICD-10-CM | POA: Diagnosis present

## 2015-04-30 DIAGNOSIS — Z823 Family history of stroke: Secondary | ICD-10-CM | POA: Diagnosis not present

## 2015-04-30 DIAGNOSIS — K8050 Calculus of bile duct without cholangitis or cholecystitis without obstruction: Secondary | ICD-10-CM | POA: Diagnosis not present

## 2015-04-30 DIAGNOSIS — R1011 Right upper quadrant pain: Secondary | ICD-10-CM | POA: Diagnosis not present

## 2015-04-30 DIAGNOSIS — K858 Other acute pancreatitis without necrosis or infection: Secondary | ICD-10-CM

## 2015-04-30 DIAGNOSIS — Z66 Do not resuscitate: Secondary | ICD-10-CM | POA: Diagnosis present

## 2015-04-30 DIAGNOSIS — Q396 Congenital diverticulum of esophagus: Secondary | ICD-10-CM | POA: Diagnosis not present

## 2015-04-30 DIAGNOSIS — K219 Gastro-esophageal reflux disease without esophagitis: Secondary | ICD-10-CM | POA: Diagnosis present

## 2015-04-30 DIAGNOSIS — H9319 Tinnitus, unspecified ear: Secondary | ICD-10-CM | POA: Diagnosis present

## 2015-04-30 DIAGNOSIS — R945 Abnormal results of liver function studies: Secondary | ICD-10-CM | POA: Diagnosis not present

## 2015-04-30 DIAGNOSIS — Z87891 Personal history of nicotine dependence: Secondary | ICD-10-CM | POA: Diagnosis not present

## 2015-04-30 DIAGNOSIS — K8064 Calculus of gallbladder and bile duct with chronic cholecystitis without obstruction: Secondary | ICD-10-CM | POA: Diagnosis not present

## 2015-04-30 DIAGNOSIS — D649 Anemia, unspecified: Secondary | ICD-10-CM | POA: Diagnosis not present

## 2015-04-30 DIAGNOSIS — H409 Unspecified glaucoma: Secondary | ICD-10-CM | POA: Diagnosis present

## 2015-04-30 DIAGNOSIS — R932 Abnormal findings on diagnostic imaging of liver and biliary tract: Secondary | ICD-10-CM | POA: Diagnosis not present

## 2015-04-30 DIAGNOSIS — K222 Esophageal obstruction: Secondary | ICD-10-CM | POA: Diagnosis present

## 2015-04-30 DIAGNOSIS — R8271 Bacteriuria: Secondary | ICD-10-CM | POA: Diagnosis present

## 2015-04-30 LAB — COMPREHENSIVE METABOLIC PANEL
ALBUMIN: 3.5 g/dL (ref 3.5–5.0)
ALK PHOS: 325 U/L — AB (ref 38–126)
ALT: 146 U/L — AB (ref 17–63)
AST: 59 U/L — AB (ref 15–41)
Anion gap: 10 (ref 5–15)
BUN: 21 mg/dL — AB (ref 6–20)
CALCIUM: 8.5 mg/dL — AB (ref 8.9–10.3)
CO2: 24 mmol/L (ref 22–32)
CREATININE: 1.21 mg/dL (ref 0.61–1.24)
Chloride: 107 mmol/L (ref 101–111)
GFR calc non Af Amer: 58 mL/min — ABNORMAL LOW (ref >60)
GLUCOSE: 100 mg/dL — AB (ref 65–99)
Potassium: 3.3 mmol/L — ABNORMAL LOW (ref 3.5–5.1)
SODIUM: 141 mmol/L (ref 135–145)
Total Bilirubin: 2.6 mg/dL — ABNORMAL HIGH (ref 0.3–1.2)
Total Protein: 6.2 g/dL — ABNORMAL LOW (ref 6.5–8.1)

## 2015-04-30 LAB — CBC
HCT: 33.2 % — ABNORMAL LOW (ref 39.0–52.0)
Hemoglobin: 11.2 g/dL — ABNORMAL LOW (ref 13.0–17.0)
MCH: 30.7 pg (ref 26.0–34.0)
MCHC: 33.7 g/dL (ref 30.0–36.0)
MCV: 91 fL (ref 78.0–100.0)
PLATELETS: 200 10*3/uL (ref 150–400)
RBC: 3.65 MIL/uL — ABNORMAL LOW (ref 4.22–5.81)
RDW: 13.2 % (ref 11.5–15.5)
WBC: 13.6 10*3/uL — ABNORMAL HIGH (ref 4.0–10.5)

## 2015-04-30 LAB — TSH: TSH: 1.404 u[IU]/mL (ref 0.350–4.500)

## 2015-04-30 LAB — MAGNESIUM: Magnesium: 2 mg/dL (ref 1.7–2.4)

## 2015-04-30 LAB — PHOSPHORUS: Phosphorus: 3.4 mg/dL (ref 2.5–4.6)

## 2015-04-30 MED ORDER — TRAVOPROST 0.004 % OP SOLN: 1.0000 [drp] | Freq: Every day | OPHTHALMIC | Status: DC

## 2015-04-30 MED ORDER — PIPERACILLIN-TAZOBACTAM 3.375 G IVPB
3.3750 g | Freq: Three times a day (TID) | INTRAVENOUS | Status: DC
  Administered 2015-04-30 – 2015-05-05 (×16): 3.375 g via INTRAVENOUS
  Filled 2015-04-30 (×16): qty 50

## 2015-04-30 MED ORDER — ACETAMINOPHEN 325 MG PO TABS
650.0000 mg | ORAL_TABLET | Freq: Four times a day (QID) | ORAL | Status: DC | PRN
  Administered 2015-04-30 – 2015-05-05 (×7): 650 mg via ORAL
  Filled 2015-04-30 (×7): qty 2

## 2015-04-30 MED ORDER — PIPERACILLIN-TAZOBACTAM 3.375 G IVPB 30 MIN: 3.3750 g | Freq: Three times a day (TID) | INTRAVENOUS | Status: DC

## 2015-04-30 MED ORDER — SODIUM CHLORIDE 0.9 % IV SOLN
INTRAVENOUS | Status: AC
  Administered 2015-04-30 – 2015-05-01 (×2): via INTRAVENOUS

## 2015-04-30 MED ORDER — FLUDROCORTISONE ACETATE 0.1 MG PO TABS
0.1000 mg | ORAL_TABLET | Freq: Every morning | ORAL | Status: DC
  Administered 2015-04-30 – 2015-05-05 (×6): 0.1 mg via ORAL
  Filled 2015-04-30 (×6): qty 1

## 2015-04-30 MED ORDER — HYDROMORPHONE HCL 1 MG/ML IJ SOLN: 0.5000 mg | INTRAMUSCULAR | Status: DC | PRN

## 2015-04-30 MED ORDER — LATANOPROST 0.005 % OP SOLN
1.0000 [drp] | Freq: Every day | OPHTHALMIC | Status: DC
  Administered 2015-04-30 – 2015-05-04 (×4): 1 [drp] via OPHTHALMIC
  Filled 2015-04-30: qty 2.5

## 2015-04-30 MED ORDER — ONDANSETRON HCL 4 MG PO TABS: 4.0000 mg | ORAL_TABLET | Freq: Four times a day (QID) | ORAL | Status: DC | PRN

## 2015-04-30 MED ORDER — ACETAMINOPHEN 650 MG RE SUPP: 650.0000 mg | Freq: Four times a day (QID) | RECTAL | Status: DC | PRN

## 2015-04-30 MED ORDER — SODIUM CHLORIDE 0.9 % IV SOLN
INTRAVENOUS | Status: AC
  Administered 2015-04-30 (×2): via INTRAVENOUS

## 2015-04-30 MED ORDER — ONDANSETRON HCL 4 MG/2ML IJ SOLN: 4.0000 mg | Freq: Four times a day (QID) | INTRAMUSCULAR | Status: DC | PRN

## 2015-04-30 NOTE — ED Notes (Signed)
US at bedside

## 2015-04-30 NOTE — Progress Notes (Signed)
[facility] PROGRESS NOTE    Progress Note  Bobby [name]  [id number] DOB: 08/21/1940 DOA: 04/29/2015 PCP: Mathews Argyle, MD  Outpatient Specialists:    Brief Narrative:   Bobby [name] is an 75 y.o. male past medical history of esophageal strictures and orthostatic hypotension who presents to the ED was 3 weeks of poor appetite and significant epigastric pain that does not improve with eating, his lipase was also thousand with elevation in his alkaline phosphatase and AST and ALT with a bilirubin of 5 CT scan of the abdomen and pelvis showed several stones in the common bile duct with mild dilation.  Assessment/Plan:   Active Problems:   Gallstone pancreatitis   Orthostatic hypotension   Pancreatitis Keep patient nothing by mouth continue IV hydration, I agree with IV empiric CT scan of the abdomen and pelvis multiple stone, his [place] count has improved with no further fever. He has been consulted for possible ERCP. Consult surgery for possible cholecystectomy in the future.  Continue Florinef for his orthostatic hypotension.   DVT prophylaxis: heparin order Family Communication:At bedside Disposition Plan: inpatient Code Status:     Code Status Orders        Start     Ordered   04/30/15 0441  Do not attempt resuscitation (DNR)   Continuous    Question Answer Comment  In the event of cardiac or respiratory ARREST Do not call a "code blue"   In the event of cardiac or respiratory ARREST Do not perform Intubation, CPR, defibrillation or ACLS   In the event of cardiac or respiratory ARREST Use medication by any route, position, wound care, and other measures to relive pain and suffering. May use oxygen, suction and manual treatment of airway obstruction as needed for comfort.      04/30/15 0440    Code Status History    Date Active Date Inactive Code Status Order ID Comments User Context   This patient has a current code status but no historical code  status.        IV Access:    Peripheral IV   Procedures and diagnostic studies:   Ct Abdomen Pelvis W Contrast  04/29/2015  CLINICAL DATA:  Subacute onset of generalized abdominal pain. Initial encounter. EXAM: CT ABDOMEN AND PELVIS WITH CONTRAST TECHNIQUE: Multidetector CT imaging of the abdomen and pelvis was performed using the standard protocol following bolus administration of intravenous contrast. CONTRAST:  114mL ISOVUE-300 IOPAMIDOL (ISOVUE-300) INJECTION 61% COMPARISON:  None. FINDINGS: The visualized lung bases are clear. There appears to be a moderate diverticulum arising from a small hiatal hernia. There is mild intrahepatic biliary duct dilatation. Multiple large stones are seen within the gallbladder. There is dilatation of the common bile duct to 1.3 cm, with 3 stones seen in the common bile duct, concerning for obstruction. The pancreas and adrenal glands are unremarkable. The spleen is grossly unremarkable in appearance. The kidneys are unremarkable in appearance. There is no evidence of hydronephrosis. No renal or ureteral stones are seen. Mild nonspecific perinephric stranding is noted bilaterally. No free fluid is identified. The small bowel is unremarkable in appearance. The stomach is within normal limits. No acute vascular abnormalities are seen. Minimal calcification is seen along the abdominal aorta and its branches. The appendix is normal in caliber and contains trace air, without evidence of appendicitis. Diffuse diverticulosis is noted along the descending and sigmoid colon, without evidence of diverticulitis. The bladder is mildly distended. Mild bladder wall thickening could reflect cystitis. The prostate  remains normal in size, with minimal calcification. No inguinal lymphadenopathy is seen. No acute osseous abnormalities are identified. Mild vacuum phenomenon is noted at L4-L5. IMPRESSION: 1. Dilatation of the common bile duct to 1.3 cm, with 3 stones seen in the common  bile duct, concerning for obstruction. ERCP would be helpful for further evaluation, as deemed clinically appropriate. 2. Multiple large stones seen within the gallbladder. Mild intrahepatic biliary ductal dilatation seen. 3. Mild bladder wall thickening could reflect cystitis. Would correlate with the patient's symptoms. 4. Apparent moderate diverticulum seen arising from a small hiatal hernia. 5. Diffuse diverticulosis along the descending and sigmoid colon, without evidence of diverticulitis. Electronically Signed   By: Garald Balding M.D.   On: 04/29/2015 23:38   US Abdomen Limited Ruq  04/30/2015  CLINICAL DATA:  Right upper quadrant pain for 2 days with abnormal CT EXAM: US ABDOMEN LIMITED - RIGHT UPPER QUADRANT COMPARISON:  CT from yesterday FINDINGS: Gallbladder: Cholelithiasis. No wall thickening or focal tenderness to suggest acute cholecystitis. Common bile duct: Diameter: 13 mm at maximum. Multiple calculi seen in the lower common bile duct, up to 8 mm in diameter. Intrahepatic bile duct enlargement. Liver: No focal lesion identified. Within normal limits in parenchymal echogenicity. IMPRESSION: 1. Choledocholithiasis with dilated/obstructed biliary tree. 2. Cholelithiasis without cholecystitis. Electronically Signed   By: Monte Fantasia M.D.   On: 04/30/2015 01:31     Medical Consultants:    None.  Anti-Infectives:   Zosyn 4.28.2017  Subjective:    [name] no abd pain, not hungry  Objective:    Filed Vitals:   04/29/15 2316 04/30/15 0244 04/30/15 0421 04/30/15 0447  BP: 162/76 163/82 151/86 165/70  Pulse: 58 55 62 59  Temp: 98.7 F (37.1 C) 98.4 F (36.9 C)  99 F (37.2 C)  TempSrc: Oral Oral  Oral  Resp: 20 20 17 18   Height:    6\' 6"  (1.981 m)  Weight:    95.709 kg (211 lb)  SpO2: 99% 96% 96% 98%    Intake/Output Summary (Last 24 hours) at 04/30/15 0836 Last data filed at 04/30/15 0700  Gross per 24 hour  Intake  367.5 ml  Output    250 ml  Net  117.5  ml   Filed Weights   04/30/15 0447  Weight: 95.709 kg (211 lb)    Exam: General exam: In no acute distress. Respiratory system: Good air movement and clear to auscultation. Cardiovascular system: S1 & S2 heard, RRR.  Gastrointestinal system: Abdomen is nondistended, soft and nontender.  Central nervous system: Alert and oriented. No focal neurological deficits. Extremities: No pedal edema. Skin: No rashes, lesions or ulcers Psychiatry: Judgement and insight appear normal. Mood & affect appropriate.    Data Reviewed:    Labs: Basic Metabolic Panel:  Recent Labs Lab 04/29/15 1834 04/30/15 0504  NA 140 141  K 3.6 3.3*  CL 104 107  CO2 23 24  GLUCOSE 134* 100*  BUN 24* 21*  CREATININE 1.21 1.21  CALCIUM 9.0 8.5*  MG  --  2.0  PHOS  --  3.4   GFR Estimated Creatinine Clearance: 70.3 mL/min (by C-G formula based on Cr of 1.21). Liver Function Tests:  Recent Labs Lab 04/29/15 1834 04/30/15 0504  AST 100* 59*  ALT 191* 146*  ALKPHOS 396* 325*  BILITOT 5.0* 2.6*  PROT 7.0 6.2*  ALBUMIN 4.0 3.5    Recent Labs Lab 04/29/15 1834  LIPASE 983*   No results for input(s): AMMONIA in the  last 168 hours. Coagulation profile No results for input(s): INR, PROTIME in the last 168 hours.  CBC:  Recent Labs Lab 04/29/15 1834 04/30/15 0504  WBC 15.1* 13.6*  HGB 12.6* 11.2*  HCT 37.0* 33.2*  MCV 91.4 91.0  PLT 190 200   Cardiac Enzymes: No results for input(s): CKTOTAL, CKMB, CKMBINDEX, TROPONINI in the last 168 hours. BNP (last 3 results) No results for input(s): PROBNP in the last 8760 hours. CBG: No results for input(s): GLUCAP in the last 168 hours. D-Dimer: No results for input(s): DDIMER in the last 72 hours. Hgb A1c: No results for input(s): HGBA1C in the last 72 hours. Lipid Profile: No results for input(s): CHOL, HDL, LDLCALC, TRIG, CHOLHDL, LDLDIRECT in the last 72 hours. Thyroid function studies:  Recent Labs  04/30/15 0504  TSH 1.404    Anemia work up: No results for input(s): VITAMINB12, FOLATE, FERRITIN, TIBC, IRON, RETICCTPCT in the last 72 hours. Sepsis Labs:  Recent Labs Lab 04/29/15 1834 04/30/15 0504  WBC 15.1* 13.6*   Microbiology No results found for this or any previous visit (from the past 240 hour(s)).   Medications:   . fludrocortisone  0.1 mg Oral q morning - 10a  . latanoprost  1 drop Both Eyes QHS  . piperacillin-tazobactam (ZOSYN)  IV  3.375 g Intravenous Q8H   Continuous Infusions: . sodium chloride 150 mL/hr at 04/30/15 0453    Time spent: 15 min   LOS: 0 days   Charlynne Cousins  [facility] Pager [phone]  *Please refer to [email or web address], password [email or web address] to get updated schedule on who will round on this patient, as hospitalists switch teams weekly. If 7PM-7AM, please contact night-coverage at [email or web address], password [email or web address] for any overnight needs.  04/30/2015, 8:36 AM

## 2015-04-30 NOTE — Consult Note (Signed)
Subjective:   HPI  The patient is a 75 year old [name] who was admitted to the hospital with abdominal pain. He states he's been having some upper abdominal pains over the past month or so but then a few days ago the pain in the upper abdomen became worse. He subsequently came to the emergency room. He was found to have a lipase of 983. Stiff blood cell count 15,100. Alkaline phosphatase 396, total bilirubin 5, AST 100, ALT 191. His bilirubin today has come down to 2.6. He had a CT scan showing gallstones and choledocholithiasis with dilated bile duct. He has noticed recently some dark-colored urine and light-colored stools. States his pain is a little better today but he still has some. Denies nausea or vomiting.  He has a history of an esophageal stricture dilated by Dr. Wynetta Emery in 2014. Review of barium swallow showed multiple esophageal diverticulum, and a hiatal hernia.  Review of Systems No chest pain  Past Medical History  Diagnosis Date  . Anxiety   . Tinnitus   . Diverticulosis   . Orthostatic hypotension    Past Surgical History  Procedure Laterality Date  . Tonsillectomy    . Esophagogastroduodenoscopy N/A 02/26/2012    Procedure: ESOPHAGOGASTRODUODENOSCOPY (EGD);  Surgeon: Garlan Fair, MD;  Location: [facility];  Service: Endoscopy;  Laterality: N/A;  . Balloon dilation N/A 02/26/2012    Procedure: BALLOON DILATION;  Surgeon: Garlan Fair, MD;  Location: [facility];  Service: Endoscopy;  Laterality: N/A;   Social History   Social History  . Marital Status: Married    Spouse Name: N/A  . Number of Children: N/A  . Years of Education: N/A   Occupational History  . Not on file.   Social History Main Topics  . Smoking status: Former Research scientist (life sciences)  . Smokeless tobacco: Not on file  . Alcohol Use: 1.8 oz/week    3 Shots of liquor per week     Comment: occasional  . Drug Use: No  . Sexual Activity: Not on file   Other Topics Concern  . Not on file   Social  History Narrative   family history includes Breast cancer in his mother; Cancer in his mother; Stroke in his mother. There is no history of Diabetes or Hypertension.  Current facility-administered medications:  .  0.9 %  sodium chloride infusion, , Intravenous, Continuous, Toy Baker, MD, Last Rate: 150 mL/hr at 04/30/15 1136 .  acetaminophen (TYLENOL) tablet 650 mg, 650 mg, Oral, Q6H PRN **OR** acetaminophen (TYLENOL) suppository 650 mg, 650 mg, Rectal, Q6H PRN, Toy Baker, MD .  fludrocortisone (FLORINEF) tablet 0.1 mg, 0.1 mg, Oral, q morning - 10a, Toy Baker, MD, 0.1 mg at 04/30/15 1046 .  HYDROmorphone (DILAUDID) injection 0.5 mg, 0.5 mg, Intravenous, Q4H PRN, Toy Baker, MD .  latanoprost (XALATAN) 0.005 % ophthalmic solution 1 drop, 1 drop, Both Eyes, QHS, Anastassia Doutova, MD .  ondansetron (ZOFRAN) tablet 4 mg, 4 mg, Oral, Q6H PRN **OR** ondansetron (ZOFRAN) injection 4 mg, 4 mg, Intravenous, Q6H PRN, Toy Baker, MD .  piperacillin-tazobactam (ZOSYN) IVPB 3.375 g, 3.375 g, Intravenous, Q8H, Toy Baker, MD, 3.375 g at 04/30/15 0541 No Known Allergies   Objective:     BP 165/70 mmHg  Pulse 59  Temp(Src) 99 F (37.2 C) (Oral)  Resp 18  Ht 6\' 6"  (1.981 m)  Wt 95.709 kg (211 lb)  BMI 24.39 kg/m2  SpO2 98%  He is alert and oriented  No acute distress  Heart regular rhythm  no murmurs  Lungs clear  Abdomen: Bowel sounds normal, soft, there is some mild tenderness in the epigastrium  Laboratory No components found for: D1    Assessment:     Acute gallstone pancreatitis  Gallstones  Choledocholithiasis      Plan:     We will let the pancreatitis cool down. Continue medical management. We will plan for ERCP with sphincterotomy and stone extraction during the first part of the week. After that is accomplished we can ask surgery to see him in regards to cholecystectomy. I explained ERCP with sphincterotomy to him and  his wife. I explained the risks of the procedure including bleeding infection perforation and pancreatitis. He understands and is agreeable when the time is right.

## 2015-04-30 NOTE — H&P (Signed)
Bobby [name] [id number] DOB: 09-Jul-1940 DOA: 04/29/2015   Referring MD Ayesha Rumpf PCP: Mathews Argyle, MD   Outpatient Specialists: [facility] Patient coming from: home  Chief Complaint: abdominal pain  HPI: Bobby [name] is a 75 y.o. male with medical history significant of esophageal stricture and Orthostatic hypotension    Presented with 2-3 weeks of not feeling well poor appetite. For the past few days have had significant epigastric pain  not improved with eating but not worsen. He had to change positions to help the pain. Denies fever but on 4/17 had chills. No nausea or vomiting no chest pain, no shortness of breath no diarrhea no constipation.  He went to PCP and had la work done and from there he was sent to ER.    Regarding pertinent Chronic problems: he had esophagus was stretched 3-4 years ago he has been een see by Dr.Johnson. He has hx of orthostatic hypotension on florinef.   IN ER:  Lipase 983, WBC 15, TB 5.0 AST 100 ALT 191 CT showing dilatation of common bile duct up to 1.3 cm a 3 stones seen in the common bile duct concerning for obstruction ERCP would be helpful for father evaluation also multiple large stones in the noted within gallbladder  Ultrasound showing choledocholithiasis with dilated obstructed biliary tree no evidence of cholecystitis Given elevated Barbaro blood cell count and chills patient was started on Zosyn  [facility] was called Dr. Bobbe Medico was aware  Hospitalist was called for admission for pancreatitis  Review of Systems:    Pertinent positives include:  chills, fatigue, weight loss change in color of urine, abdominal pain,   Constitutional:  No weight loss, night sweats, Fevers, HEENT:  No headaches, Difficulty swallowing,Tooth/dental problems,Sore throat,  No sneezing, itching, ear ache, nasal congestion, post nasal drip,  Cardio-vascular:  No chest pain, Orthopnea, PND, anasarca, dizziness, palpitations.no Bilateral lower  extremity swelling  GI:  No heartburn, indigestion, nausea, vomiting, diarrhea, change in bowel habits, loss of appetite, melena, blood in stool, hematemesis Resp:  no shortness of breath at rest. No dyspnea on exertion, No excess mucus, no productive cough, No non-productive cough, No coughing up of blood.No change in color of mucus.No wheezing. Skin:  no rash or lesions. No jaundice GU:  no dysuria,  no urgency or frequency. No straining to urinate.  No flank pain.  Musculoskeletal:  No joint pain or no joint swelling. No decreased range of motion. No back pain.  Psych:  No change in mood or affect. No depression or anxiety. No memory loss.  Neuro: no localizing neurological complaints, no tingling, no weakness, no double vision, no gait abnormality, no slurred speech, no confusion  As per HPI otherwise 10 point review of systems negative.   Past Medical History: Past Medical History  Diagnosis Date  . Anxiety   . Tinnitus   . Diverticulosis   . Orthostatic hypotension    Past Surgical History  Procedure Laterality Date  . Tonsillectomy    . Esophagogastroduodenoscopy N/A 02/26/2012    Procedure: ESOPHAGOGASTRODUODENOSCOPY (EGD);  Surgeon: Garlan Fair, MD;  Location: [facility];  Service: Endoscopy;  Laterality: N/A;  . Balloon dilation N/A 02/26/2012    Procedure: BALLOON DILATION;  Surgeon: Garlan Fair, MD;  Location: [facility];  Service: Endoscopy;  Laterality: N/A;     Social History:  Ambulatory  independently   Lives at home  With family     reports that he has quit smoking. He does not have any  smokeless tobacco history on file. He reports that he drinks about 1.8 oz of alcohol per week. He reports that he does not use illicit drugs.  Allergies:  No Known Allergies     Family History:    Family History  Problem Relation Age of Onset  . Cancer Mother   . Breast cancer Mother   . Stroke Mother   . Diabetes Neg Hx   . Hypertension Neg Hx      Medications: Prior to Admission medications   Medication Sig Start Date End Date Taking? Authorizing Provider  acetaminophen (TYLENOL) 500 MG tablet Take 1,000-1,500 mg by mouth 2 (two) times daily.    Yes Historical Provider, MD  cholecalciferol (VITAMIN D) 1000 units tablet Take 1,000 Units by mouth daily.   Yes Historical Provider, MD  fludrocortisone (FLORINEF) 0.1 MG tablet Take 0.1 mg by mouth every morning. 03/30/15  Yes Historical Provider, MD  Multiple Vitamin (MULTIVITAMIN WITH MINERALS) TABS tablet Take 1 tablet by mouth daily.   Yes Historical Provider, MD  omeprazole (PRILOSEC) 20 MG capsule Take 20 mg by mouth daily with breakfast. 04/25/15  Yes Historical Provider, MD  travoprost, benzalkonium, (TRAVATAN) 0.004 % ophthalmic solution Place 1 drop into both eyes at bedtime.   Yes Historical Provider, MD    Physical Exam: Patient Vitals for the past 24 hrs:  BP Temp Temp src Pulse Resp SpO2  04/29/15 2316 162/76 mmHg 98.7 F (37.1 C) Oral (!) 58 20 99 %  04/29/15 2007 146/74 mmHg 98.9 F (37.2 C) Oral 81 18 96 %  04/29/15 1821 107/72 mmHg 99 F (37.2 C) Oral 86 18 100 %    1. General:  in No Acute distress 2. Psychological: Alert and   Oriented 3. Head/ENT:    Dry Mucous Membranes                          Head Non traumatic, neck supple                          Normal  Dentition 4. SKIN: decreased Skin turgor,  Skin clean Dry and intact no rash 5. Heart: Regular rate and rhythm no Murmur, Rub or gallop 6. Lungs: Clear to auscultation bilaterally, no wheezes or crackles   7. Abdomen: Soft Mild epigastric tenderness, Non distended 8. Lower extremities: no clubbing, cyanosis, or edema 9. Neurologically Grossly intact, moving all 4 extremities equally 10. MSK: Normal range of motion   body mass index is unknown because there is no weight on file.  Labs on Admission:   Labs on Admission: I have personally reviewed following labs and imaging studies  CBC:  Recent  Labs Lab 04/29/15 1834  WBC 15.1*  HGB 12.6*  HCT 37.0*  MCV 91.4  PLT 99991111   Basic Metabolic Panel:  Recent Labs Lab 04/29/15 1834  NA 140  K 3.6  CL 104  CO2 23  GLUCOSE 134*  BUN 24*  CREATININE 1.21  CALCIUM 9.0   GFR: CrCl cannot be calculated (Unknown ideal weight.). Liver Function Tests:  Recent Labs Lab 04/29/15 1834  AST 100*  ALT 191*  ALKPHOS 396*  BILITOT 5.0*  PROT 7.0  ALBUMIN 4.0    Recent Labs Lab 04/29/15 1834  LIPASE 983*   No results for input(s): AMMONIA in the last 168 hours. Coagulation Profile: No results for input(s): INR, PROTIME in the last 168 hours. Cardiac Enzymes: No results  for input(s): CKTOTAL, CKMB, CKMBINDEX, TROPONINI in the last 168 hours. BNP (last 3 results) No results for input(s): PROBNP in the last 8760 hours. HbA1C: No results for input(s): HGBA1C in the last 72 hours. CBG: No results for input(s): GLUCAP in the last 168 hours. Lipid Profile: No results for input(s): CHOL, HDL, LDLCALC, TRIG, CHOLHDL, LDLDIRECT in the last 72 hours. Thyroid Function Tests: No results for input(s): TSH, T4TOTAL, FREET4, T3FREE, THYROIDAB in the last 72 hours. Anemia Panel: No results for input(s): VITAMINB12, FOLATE, FERRITIN, TIBC, IRON, RETICCTPCT in the last 72 hours. Urine analysis:    Component Value Date/Time   COLORURINE ORANGE* 04/29/2015 2236   APPEARANCEUR TURBID* 04/29/2015 2236   LABSPEC 1.034* 04/29/2015 2236   PHURINE 5.5 04/29/2015 2236   GLUCOSEU NEGATIVE 04/29/2015 2236   HGBUR NEGATIVE 04/29/2015 2236   BILIRUBINUR LARGE* 04/29/2015 2236   KETONESUR 15* 04/29/2015 2236   PROTEINUR 30* 04/29/2015 2236   NITRITE POSITIVE* 04/29/2015 2236   LEUKOCYTESUR SMALL* 04/29/2015 2236   Sepsis Labs: @LABRCNTIP (procalcitonin:4,lacticidven:4) )No results found for this or any previous visit (from the past 240 hour(s)).    UA no evidence of UTI, positive nitrites but no WBC or RBC only few bacteria  No results  found for: HGBA1C  CrCl cannot be calculated (Unknown ideal weight.).  BNP (last 3 results) No results for input(s): PROBNP in the last 8760 hours.   ECG REPORT   There were no vitals filed for this visit.   Cultures: No results found for: SDES, Media, CULT, REPTSTATUS   Radiological Exams on Admission: Ct Abdomen Pelvis W Contrast  04/29/2015  CLINICAL DATA:  Subacute onset of generalized abdominal pain. Initial encounter. EXAM: CT ABDOMEN AND PELVIS WITH CONTRAST TECHNIQUE: Multidetector CT imaging of the abdomen and pelvis was performed using the standard protocol following bolus administration of intravenous contrast. CONTRAST:  185mL ISOVUE-300 IOPAMIDOL (ISOVUE-300) INJECTION 61% COMPARISON:  None. FINDINGS: The visualized lung bases are clear. There appears to be a moderate diverticulum arising from a small hiatal hernia. There is mild intrahepatic biliary duct dilatation. Multiple large stones are seen within the gallbladder. There is dilatation of the common bile duct to 1.3 cm, with 3 stones seen in the common bile duct, concerning for obstruction. The pancreas and adrenal glands are unremarkable. The spleen is grossly unremarkable in appearance. The kidneys are unremarkable in appearance. There is no evidence of hydronephrosis. No renal or ureteral stones are seen. Mild nonspecific perinephric stranding is noted bilaterally. No free fluid is identified. The small bowel is unremarkable in appearance. The stomach is within normal limits. No acute vascular abnormalities are seen. Minimal calcification is seen along the abdominal aorta and its branches. The appendix is normal in caliber and contains trace air, without evidence of appendicitis. Diffuse diverticulosis is noted along the descending and sigmoid colon, without evidence of diverticulitis. The bladder is mildly distended. Mild bladder wall thickening could reflect cystitis. The prostate remains normal in size, with minimal  calcification. No inguinal lymphadenopathy is seen. No acute osseous abnormalities are identified. Mild vacuum phenomenon is noted at L4-L5. IMPRESSION: 1. Dilatation of the common bile duct to 1.3 cm, with 3 stones seen in the common bile duct, concerning for obstruction. ERCP would be helpful for further evaluation, as deemed clinically appropriate. 2. Multiple large stones seen within the gallbladder. Mild intrahepatic biliary ductal dilatation seen. 3. Mild bladder wall thickening could reflect cystitis. Would correlate with the patient's symptoms. 4. Apparent moderate diverticulum seen arising from a small  hiatal hernia. 5. Diffuse diverticulosis along the descending and sigmoid colon, without evidence of diverticulitis. Electronically Signed   By: Garald Balding M.D.   On: 04/29/2015 23:38    Chart has been reviewed    Assessment/Plan  75 y.o. male with medical history significant of esophageal stricture and Orthostatic hypotension presents with episodic epigastric pain was found to have choledocholithiasis and gallstone induced pancreatitis, [facility] aware   Present on Admission:  . Gallstone pancreatitis - we'll keep nothing by mouth. Rehydrate. Given chills and elevated Derosia blood cell count for now continue Zosyn although ultrasound did not show evidence of cholecystitis could be discontinued and that 24 hours of no evidence of infection.  [facility] to see patient determine if could be good candidate for ERCP will need elective cholecystectomy in the near future . Orthostatic hypotension  - continue Florinef    Other plan as per orders.  DVT prophylaxis:  SCD    Code Status:   DNR/DNI   as per patient    Family Communication:   Family  at  Bedside  plan of care was discussed with  Wife Bobby [name] [id number]  Disposition Plan:  To home once workup is complete and patient is stable   Consults called: [facility] by ER MD  Admission status:    inpatient       Level of care        medical floor        I have spent a total of 23min on this admission    Bobby [name] 04/30/2015, 2:03 AM    [facility]  Pager [phone]   after 2 AM please page floor coverage PA If 7AM-7PM, please contact the day team taking care of the patient  [email or web address]  Password [email or web address]

## 2015-04-30 NOTE — Progress Notes (Addendum)
IV fluid order expired.  MD paged twice to make him aware of expiration.  No new orders. Night shift RN made aware.

## 2015-05-01 DIAGNOSIS — E876 Hypokalemia: Secondary | ICD-10-CM

## 2015-05-01 DIAGNOSIS — I951 Orthostatic hypotension: Secondary | ICD-10-CM

## 2015-05-01 DIAGNOSIS — K851 Biliary acute pancreatitis without necrosis or infection: Principal | ICD-10-CM

## 2015-05-01 LAB — BASIC METABOLIC PANEL
Anion gap: 13 (ref 5–15)
BUN: 20 mg/dL (ref 6–20)
CALCIUM: 8.5 mg/dL — AB (ref 8.9–10.3)
CHLORIDE: 107 mmol/L (ref 101–111)
CO2: 20 mmol/L — AB (ref 22–32)
CREATININE: 0.95 mg/dL (ref 0.61–1.24)
GFR calc Af Amer: >60 mL/min (ref >60)
GFR calc non Af Amer: >60 mL/min (ref >60)
GLUCOSE: 71 mg/dL (ref 65–99)
Potassium: 3.7 mmol/L (ref 3.5–5.1)
Sodium: 140 mmol/L (ref 135–145)

## 2015-05-01 MED ORDER — CETYLPYRIDINIUM CHLORIDE 0.05 % MT LIQD
7.0000 mL | Freq: Two times a day (BID) | OROMUCOSAL | Status: DC
  Administered 2015-05-01 – 2015-05-05 (×6): 7 mL via OROMUCOSAL

## 2015-05-01 MED ORDER — CHLORHEXIDINE GLUCONATE 0.12 % MT SOLN
15.0000 mL | Freq: Two times a day (BID) | OROMUCOSAL | Status: DC
  Administered 2015-05-01 – 2015-05-05 (×5): 15 mL via OROMUCOSAL
  Filled 2015-05-01 (×7): qty 15

## 2015-05-01 MED ORDER — POTASSIUM CHLORIDE 10 MEQ/100ML IV SOLN
10.0000 meq | INTRAVENOUS | Status: DC
  Filled 2015-05-01: qty 100

## 2015-05-01 NOTE — Progress Notes (Signed)
[facility] Progress Note  Subjective: The patient is doing well today. He denies abdominal pain. We again talked about ERCP with sphincterotomy and biliary stone extraction. He understands the procedure. It will be scheduled for tomorrow.  Objective: Vital signs in last 24 hours: Temp:  [98.5 F (36.9 C)-99.3 F (37.4 C)] 98.5 F (36.9 C) (04/30 [id number]) Pulse Rate:  [51-61] 51 (04/30 0637) Resp:  [18-20] 18 (04/30 0637) BP: (146-162)/(66-73) 146/66 mmHg (04/30 0637) SpO2:  [97 %-99 %] 97 % (04/30 0637) Weight change:    PE:  No distress  Heart regular rhythm  Lungs clear  Abdomen is soft and nontender  Lab Results: No results found for this or any previous visit (from the past 24 hour(s)).  Studies/Results: No results found.    Assessment: Gallstone pancreatitis  Plan:   ERCP with sphincterotomy and stone extraction tomorrow.    Cassell Clement 05/01/2015, 9:35 AM  Pager: [phone] If no answer or after 5 PM call [phone]

## 2015-05-01 NOTE — Progress Notes (Signed)
PROGRESS NOTE    Bobby [name]  [id number] DOB: [birth year] DOA: 04/29/2015 PCP: Mathews Argyle, MD  Outpatient Specialists:  Brief Narrative: Bobby [name] is an 75 y.o. male past medical history of esophageal strictures and orthostatic hypotension who presents to the ED was 3 weeks of poor appetite and significant epigastric pain that does not improve with eating, his lipase was also thousand with elevation in his alkaline phosphatase and AST and ALT with a bilirubin of 5 CT scan of the abdomen and pelvis showed several stones in the common bile duct with mild dilation.  Assessment & Plan   Gallstone pancreatitis/choledocholithiais -CT abdomen shows dilatation of the common bile duct to 1.3 cm with 3 stones seen in the common bile duct, concerning for obstruction.  Multiple large stone seen in the gallbladder, mild intrahepatic biliary ductal dilatation -Upon admission : Lipase 983, AST 100, ALT 191  -Gastroenterology consulted and appreciated  -Plan for ERCP on 05/02/2015  -Continue IV Zosyn  Leukocytosis -Secondary to the above, WBC trending downward -continue to monitor CBC  Orthostatic Hypotension -Continue Florinef  Asymptomatic bacteriuria  -Patient denies any urinary symptoms or dysuria  -Patient currently on IV Zosyn   Hypokalemia -Will replace and continue to monitor BMP  DVT Prophylaxis    Code Status: DNR  Family Communication: None at bedside  Disposition Plan: Admitted  Consultants Gastroenterology  Procedures  None  Antibiotics   Anti-infectives    Start     Dose/Rate Route Frequency Ordered Stop   04/30/15 0600  piperacillin-tazobactam (ZOSYN) IVPB 3.375 g  Status:  Discontinued     3.375 g 100 mL/hr over 30 Minutes Intravenous Every 8 hours 04/30/15 0440 04/30/15 0454   04/30/15 0600  piperacillin-tazobactam (ZOSYN) IVPB 3.375 g     3.375 g 12.5 mL/hr over 240 Minutes Intravenous Every 8 hours 04/30/15 0454     04/29/15 2200   piperacillin-tazobactam (ZOSYN) IVPB 3.375 g     3.375 g 100 mL/hr over 30 Minutes Intravenous  Once 04/29/15 2158 04/30/15 0003      Subjective:   Bobby [name] seen and examined today.  Patient states she's feeling much better today. Would like TPN understands that he cannot. Denies any chest pain, shortness of breath, nausea or vomiting. Feels his abdominal pain has improved.  Objective:   Filed Vitals:   04/30/15 1418 04/30/15 2150 05/01/15 0202 05/01/15 0637  BP: 154/73 162/71 150/66 146/66  Pulse: 57 61 52 51  Temp: 98.9 F (37.2 C) 98.7 F (37.1 C) 99.3 F (37.4 C) 98.5 F (36.9 C)  TempSrc: Oral Oral Oral Oral  Resp: 18 20 20 18   Height:      Weight:      SpO2: 99% 98% 97% 97%    Intake/Output Summary (Last 24 hours) at 05/01/15 1202 Last data filed at 05/01/15 0600  Gross per 24 hour  Intake   1495 ml  Output      0 ml  Net   1495 ml   Filed Weights   04/30/15 0447  Weight: 95.709 kg (211 lb)    Exam  General: Well developed, well nourished, NAD, appears stated age  HEENT: NCAT,  mucous membranes moist.   Cardiovascular: S1 S2 auscultated, no rubs, murmurs or gallops. Regular rate and rhythm.  Respiratory: Clear to auscultation bilaterally with equal chest rise  Abdomen: Soft, nontender, nondistended, + bowel sounds  Extremities: warm dry without cyanosis clubbing or edema  Neuro: AAOx3, nonfocal  Psych: Normal affect and demeanor with intact  judgement and insight   Data Reviewed: I have personally reviewed following labs and imaging studies  CBC:  Recent Labs Lab 04/29/15 1834 04/30/15 0504  WBC 15.1* 13.6*  HGB 12.6* 11.2*  HCT 37.0* 33.2*  MCV 91.4 91.0  PLT 190 [id number]   Basic Metabolic Panel:  Recent Labs Lab 04/29/15 1834 04/30/15 0504  NA 140 141  K 3.6 3.3*  CL 104 107  CO2 23 24  GLUCOSE 134* 100*  BUN 24* 21*  CREATININE 1.21 1.21  CALCIUM 9.0 8.5*  MG  --  2.0  PHOS  --  3.4   GFR: Estimated Creatinine  Clearance: 70.3 mL/min (by C-G formula based on Cr of 1.21). Liver Function Tests:  Recent Labs Lab 04/29/15 1834 04/30/15 0504  AST 100* 59*  ALT 191* 146*  ALKPHOS 396* 325*  BILITOT 5.0* 2.6*  PROT 7.0 6.2*  ALBUMIN 4.0 3.5    Recent Labs Lab 04/29/15 1834  LIPASE 983*   No results for input(s): AMMONIA in the last 168 hours. Coagulation Profile: No results for input(s): INR, PROTIME in the last 168 hours. Cardiac Enzymes: No results for input(s): CKTOTAL, CKMB, CKMBINDEX, TROPONINI in the last 168 hours. BNP (last 3 results) No results for input(s): PROBNP in the last 8760 hours. HbA1C: No results for input(s): HGBA1C in the last 72 hours. CBG: No results for input(s): GLUCAP in the last 168 hours. Lipid Profile: No results for input(s): CHOL, HDL, LDLCALC, TRIG, CHOLHDL, LDLDIRECT in the last 72 hours. Thyroid Function Tests:  Recent Labs  04/30/15 0504  TSH 1.404   Anemia Panel: No results for input(s): VITAMINB12, FOLATE, FERRITIN, TIBC, IRON, RETICCTPCT in the last 72 hours. Urine analysis:    Component Value Date/Time   COLORURINE ORANGE* 04/29/2015 2236   APPEARANCEUR TURBID* 04/29/2015 2236   LABSPEC 1.034* 04/29/2015 2236   PHURINE 5.5 04/29/2015 2236   GLUCOSEU NEGATIVE 04/29/2015 2236   HGBUR NEGATIVE 04/29/2015 2236   BILIRUBINUR LARGE* 04/29/2015 2236   KETONESUR 15* 04/29/2015 2236   PROTEINUR 30* 04/29/2015 2236   NITRITE POSITIVE* 04/29/2015 2236   LEUKOCYTESUR SMALL* 04/29/2015 2236   Sepsis Labs: @LABRCNTIP (procalcitonin:4,lacticidven:4)  )No results found for this or any previous visit (from the past 240 hour(s)).    Radiology Studies: Ct Abdomen Pelvis W Contrast  04/29/2015  CLINICAL DATA:  Subacute onset of generalized abdominal pain. Initial encounter. EXAM: CT ABDOMEN AND PELVIS WITH CONTRAST TECHNIQUE: Multidetector CT imaging of the abdomen and pelvis was performed using the standard protocol following bolus administration  of intravenous contrast. CONTRAST:  118mL ISOVUE-300 IOPAMIDOL (ISOVUE-300) INJECTION 61% COMPARISON:  None. FINDINGS: The visualized lung bases are clear. There appears to be a moderate diverticulum arising from a small hiatal hernia. There is mild intrahepatic biliary duct dilatation. Multiple large stones are seen within the gallbladder. There is dilatation of the common bile duct to 1.3 cm, with 3 stones seen in the common bile duct, concerning for obstruction. The pancreas and adrenal glands are unremarkable. The spleen is grossly unremarkable in appearance. The kidneys are unremarkable in appearance. There is no evidence of hydronephrosis. No renal or ureteral stones are seen. Mild nonspecific perinephric stranding is noted bilaterally. No free fluid is identified. The small bowel is unremarkable in appearance. The stomach is within normal limits. No acute vascular abnormalities are seen. Minimal calcification is seen along the abdominal aorta and its branches. The appendix is normal in caliber and contains trace air, without evidence of appendicitis. Diffuse diverticulosis is noted along  the descending and sigmoid colon, without evidence of diverticulitis. The bladder is mildly distended. Mild bladder wall thickening could reflect cystitis. The prostate remains normal in size, with minimal calcification. No inguinal lymphadenopathy is seen. No acute osseous abnormalities are identified. Mild vacuum phenomenon is noted at L4-L5. IMPRESSION: 1. Dilatation of the common bile duct to 1.3 cm, with 3 stones seen in the common bile duct, concerning for obstruction. ERCP would be helpful for further evaluation, as deemed clinically appropriate. 2. Multiple large stones seen within the gallbladder. Mild intrahepatic biliary ductal dilatation seen. 3. Mild bladder wall thickening could reflect cystitis. Would correlate with the patient's symptoms. 4. Apparent moderate diverticulum seen arising from a small hiatal  hernia. 5. Diffuse diverticulosis along the descending and sigmoid colon, without evidence of diverticulitis. Electronically Signed   By: Garald Balding M.D.   On: 04/29/2015 23:38   US Abdomen Limited Ruq  04/30/2015  CLINICAL DATA:  Right upper quadrant pain for 2 days with abnormal CT EXAM: US ABDOMEN LIMITED - RIGHT UPPER QUADRANT COMPARISON:  CT from yesterday FINDINGS: Gallbladder: Cholelithiasis. No wall thickening or focal tenderness to suggest acute cholecystitis. Common bile duct: Diameter: 13 mm at maximum. Multiple calculi seen in the lower common bile duct, up to 8 mm in diameter. Intrahepatic bile duct enlargement. Liver: No focal lesion identified. Within normal limits in parenchymal echogenicity. IMPRESSION: 1. Choledocholithiasis with dilated/obstructed biliary tree. 2. Cholelithiasis without cholecystitis. Electronically Signed   By: Monte Fantasia M.D.   On: 04/30/2015 01:31     Scheduled Meds: . antiseptic oral rinse  7 mL Mouth Rinse q12n4p  . chlorhexidine  15 mL Mouth Rinse BID  . fludrocortisone  0.1 mg Oral q morning - 10a  . latanoprost  1 drop Both Eyes QHS  . piperacillin-tazobactam (ZOSYN)  IV  3.375 g Intravenous Q8H   Continuous Infusions:    LOS: 1 day   Time Spent in minutes   30 minutes  Aariona Momon D.O. on 05/01/2015 at 12:02 PM  Between 7am to 7pm - Pager - [phone]  After 7pm go to [email or web address] - password [email or web address]  And look for the night coverage person covering for me after hours  [facility] Office  [phone]

## 2015-05-02 ENCOUNTER — Encounter (HOSPITAL_COMMUNITY): Payer: Self-pay | Admitting: *Deleted

## 2015-05-02 ENCOUNTER — Inpatient Hospital Stay (HOSPITAL_COMMUNITY): Payer: Medicare Other

## 2015-05-02 ENCOUNTER — Encounter (HOSPITAL_COMMUNITY)
Admission: EM | Disposition: A | Discharge status: Home / Self Care | Payer: Self-pay | Source: Home / Self Care | Attending: Internal Medicine

## 2015-05-02 ENCOUNTER — Inpatient Hospital Stay (HOSPITAL_COMMUNITY): Payer: Medicare Other | Admitting: Registered Nurse

## 2015-05-02 DIAGNOSIS — D649 Anemia, unspecified: Secondary | ICD-10-CM

## 2015-05-02 HISTORY — PX: ESOPHAGOGASTRODUODENOSCOPY (EGD) WITH PROPOFOL: SHX5813

## 2015-05-02 LAB — COMPREHENSIVE METABOLIC PANEL
ALBUMIN: 3.1 g/dL — AB (ref 3.5–5.0)
ALT: 73 U/L — ABNORMAL HIGH (ref 17–63)
ANION GAP: 12 (ref 5–15)
AST: 20 U/L (ref 15–41)
Alkaline Phosphatase: 221 U/L — ABNORMAL HIGH (ref 38–126)
BILIRUBIN TOTAL: 1.8 mg/dL — AB (ref 0.3–1.2)
BUN: 20 mg/dL (ref 6–20)
CO2: 22 mmol/L (ref 22–32)
Calcium: 8.8 mg/dL — ABNORMAL LOW (ref 8.9–10.3)
Chloride: 110 mmol/L (ref 101–111)
Creatinine, Ser: 0.99 mg/dL (ref 0.61–1.24)
GFR calc Af Amer: >60 mL/min (ref >60)
GFR calc non Af Amer: >60 mL/min (ref >60)
GLUCOSE: 69 mg/dL (ref 65–99)
Potassium: 3.8 mmol/L (ref 3.5–5.1)
Sodium: 144 mmol/L (ref 135–145)
TOTAL PROTEIN: 6 g/dL — AB (ref 6.5–8.1)

## 2015-05-02 LAB — CBC
HEMATOCRIT: 31.6 % — AB (ref 39.0–52.0)
Hemoglobin: 10.8 g/dL — ABNORMAL LOW (ref 13.0–17.0)
MCH: 30.9 pg (ref 26.0–34.0)
MCHC: 34.2 g/dL (ref 30.0–36.0)
MCV: 90.5 fL (ref 78.0–100.0)
Platelets: 220 10*3/uL (ref 150–400)
RBC: 3.49 MIL/uL — ABNORMAL LOW (ref 4.22–5.81)
RDW: 12.6 % (ref 11.5–15.5)
WBC: 12 10*3/uL — ABNORMAL HIGH (ref 4.0–10.5)

## 2015-05-02 LAB — LIPASE, BLOOD: LIPASE: 33 U/L (ref 11–51)

## 2015-05-02 LAB — PROTIME-INR
INR: 1.09 (ref 0.00–1.49)
PROTHROMBIN TIME: 14.3 s (ref 11.6–15.2)

## 2015-05-02 SURGERY — ESOPHAGOGASTRODUODENOSCOPY (EGD) WITH PROPOFOL
Anesthesia: General

## 2015-05-02 MED ORDER — MIDAZOLAM HCL 2 MG/2ML IJ SOLN
INTRAMUSCULAR | Status: AC
  Filled 2015-05-02: qty 2

## 2015-05-02 MED ORDER — LIDOCAINE HCL (CARDIAC) 20 MG/ML IV SOLN
INTRAVENOUS | Status: AC
  Filled 2015-05-02: qty 5

## 2015-05-02 MED ORDER — PROPOFOL 10 MG/ML IV BOLUS
INTRAVENOUS | Status: AC
  Filled 2015-05-02: qty 20

## 2015-05-02 MED ORDER — FENTANYL CITRATE (PF) 100 MCG/2ML IJ SOLN
INTRAMUSCULAR | Status: DC | PRN
  Administered 2015-05-02: 100 ug via INTRAVENOUS

## 2015-05-02 MED ORDER — PROPOFOL 10 MG/ML IV BOLUS
INTRAVENOUS | Status: DC | PRN
  Administered 2015-05-02: 150 mg via INTRAVENOUS

## 2015-05-02 MED ORDER — CIPROFLOXACIN IN D5W 400 MG/200ML IV SOLN
400.0000 mg | Freq: Two times a day (BID) | INTRAVENOUS | Status: DC
  Administered 2015-05-02 – 2015-05-04 (×5): 400 mg via INTRAVENOUS
  Filled 2015-05-02 (×4): qty 200

## 2015-05-02 MED ORDER — FENTANYL CITRATE (PF) 100 MCG/2ML IJ SOLN
INTRAMUSCULAR | Status: AC
  Filled 2015-05-02: qty 2

## 2015-05-02 MED ORDER — ONDANSETRON HCL 4 MG/2ML IJ SOLN
INTRAMUSCULAR | Status: AC
  Filled 2015-05-02: qty 2

## 2015-05-02 MED ORDER — ROCURONIUM BROMIDE 100 MG/10ML IV SOLN
INTRAVENOUS | Status: DC | PRN
  Administered 2015-05-02: 30 mg via INTRAVENOUS

## 2015-05-02 MED ORDER — SODIUM CHLORIDE 0.9 % IV SOLN: INTRAVENOUS | Status: DC

## 2015-05-02 MED ORDER — CIPROFLOXACIN IN D5W 400 MG/200ML IV SOLN
INTRAVENOUS | Status: AC
  Filled 2015-05-02: qty 200

## 2015-05-02 MED ORDER — MIDAZOLAM HCL 5 MG/5ML IJ SOLN
INTRAMUSCULAR | Status: DC | PRN
  Administered 2015-05-02: 1 mg via INTRAVENOUS

## 2015-05-02 MED ORDER — GLUCAGON HCL RDNA (DIAGNOSTIC) 1 MG IJ SOLR
INTRAMUSCULAR | Status: AC
  Filled 2015-05-02: qty 1

## 2015-05-02 MED ORDER — INDOMETHACIN 50 MG RE SUPP
RECTAL | Status: AC
  Filled 2015-05-02: qty 2

## 2015-05-02 MED ORDER — SUCCINYLCHOLINE CHLORIDE 20 MG/ML IJ SOLN
INTRAMUSCULAR | Status: DC | PRN
  Administered 2015-05-02: 100 mg via INTRAVENOUS

## 2015-05-02 MED ORDER — ONDANSETRON HCL 4 MG/2ML IJ SOLN
INTRAMUSCULAR | Status: DC | PRN
  Administered 2015-05-02: 4 mg via INTRAVENOUS

## 2015-05-02 MED ORDER — SUGAMMADEX SODIUM 200 MG/2ML IV SOLN
INTRAVENOUS | Status: DC | PRN
  Administered 2015-05-02: 200 mg via INTRAVENOUS

## 2015-05-02 MED ORDER — LACTATED RINGERS IV SOLN
INTRAVENOUS | Status: DC
  Administered 2015-05-02: 1000 mL via INTRAVENOUS

## 2015-05-02 NOTE — Anesthesia Procedure Notes (Signed)
Procedure Name: Intubation Date/Time: 05/02/2015 11:17 AM Performed by: Danley Danker L Patient Re-evaluated:Patient Re-evaluated prior to inductionOxygen Delivery Method: Circle system utilized Preoxygenation: Pre-oxygenation with 100% oxygen Intubation Type: IV induction Ventilation: Mask ventilation without difficulty and Oral airway inserted - appropriate to patient size Laryngoscope Size: Miller and 3 Grade View: Grade I Tube type: Oral Tube size: 7.5 mm Number of attempts: 1 Airway Equipment and Method: Stylet Placement Confirmation: ETT inserted through vocal cords under direct vision,  breath sounds checked- equal and bilateral and positive ETCO2 Secured at: 22 cm Tube secured with: Tape Dental Injury: Teeth and Oropharynx as per pre-operative assessment

## 2015-05-02 NOTE — Op Note (Signed)
[facility] Patient Name: Bobby [name] Procedure Date: 05/02/2015 MRN: [id number] Attending MD: Arta Silence , MD Date of Birth: Jun 18, 1940 CSN:  Age: 75 Admit Type: Inpatient Procedure:                Upper GI endoscopy Indications:              Epigastric abdominal pain, bile duct stones. Providers:                Arta Silence, MD, Kingsley Plan, RN, Elspeth Cho, Technician Referring MD:             [facility] Medicines:                General Anesthesia Complications:            No immediate complications. Estimated Blood Loss:     Estimated blood loss: none. Procedure:                Pre-Anesthesia Assessment:                           - Prior to the procedure, a History and Physical                            was performed, and patient medications and                            allergies were reviewed. The patient's tolerance of                            previous anesthesia was also reviewed. The risks                            and benefits of the procedure and the sedation                            options and risks were discussed with the patient.                            All questions were answered, and informed consent                            was obtained. Prior Anticoagulants: The patient has                            taken no previous anticoagulant or antiplatelet                            agents. ASA Grade Assessment: II - A patient with                            mild systemic disease. After reviewing the risks  and benefits, the patient was deemed in                            satisfactory condition to undergo the procedure.                           After obtaining informed consent, the endoscope was                            passed under direct vision. Throughout the                            procedure, the patient's blood pressure, pulse, and   oxygen saturations were monitored continuously. The                            [id number]) scope was introduced through                            the mouth, and advanced to the lower third of                            esophagus. The [id number]) scope was                            introduced through the and used to inject contrast                            into. After obtaining informed consent, the                            endoscope was passed under direct vision.                            Throughout the procedure, the patient's blood                            pressure, pulse, and oxygen saturations were                            monitored continuously. The upper GI endoscopy was                            technically difficult and complex due to narrowing                            and poor endoscopic visualization and multiple                            esophageal diverticula. [Solution]. The patient                            tolerated the procedure well. Scope In: Scope Out: Findings:      Multiple large non-bleeding distal esophageal diverticula were found in  the lower third of the esophagus. There was an exceeding large, over 3       cm, distal esophageal diverticulum which seemed to closely border the GE       junction. At the region where I suspect the GE junction is (based on my       clinical judgement and my review of his esophagram from 2014), there is       a luminal stenosis of estimated 69mm diameter. Some food particles seen       in some of the esophageal diverticula.      The exam of the esophagus was otherwise normal. Unable to pass endoscope       into the stomach. Impression:               - Multiple diverticula in the lower third of the                            esophagus.                           - Luminal stenosis at suspected region of the GE                            junction.                           - Unable to pass diagnostic  endoscope into the                            stomach. Moderate Sedation:      N/A- Per Anesthesia Care Recommendation:           - Return patient to hospital ward for ongoing care.                           - Clear liquid diet today.                           - Continue present medications.                           - Surgery consultation for consideration of                            cholecystectomy and intraoperative common bile duct                            exploration.                           - Patient's esophageal anatomy precludes ability to                            safely perform bile duct stone removal                            endoscopically.                           -  [facility] will follow. 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 All rights reserved. The codes documented in this report are preliminary and upon coder review may  be revised to meet current compliance requirements. Arta Silence, MD 05/02/2015 12:06:26 PM This report has been signed electronically. Number of Addenda: 0

## 2015-05-02 NOTE — Anesthesia Preprocedure Evaluation (Addendum)
Anesthesia Evaluation  Patient identified by MRN, date of birth, ID band Patient awake    Reviewed: Allergy & Precautions, NPO status , Patient's Chart, lab work & pertinent test results  Airway Mallampati: II  TM Distance: >3 FB Neck ROM: Full    Dental   Pulmonary former smoker,    breath sounds clear to auscultation       Cardiovascular negative cardio ROS   Rhythm:Regular Rate:Normal     Neuro/Psych    GI/Hepatic negative GI ROS, Neg liver ROS,   Endo/Other  negative endocrine ROS  Renal/GU negative Renal ROS     Musculoskeletal   Abdominal   Peds  Hematology   Anesthesia Other Findings   Reproductive/Obstetrics                            Anesthesia Physical Anesthesia Plan  ASA: III  Anesthesia Plan: General   Post-op Pain Management:    Induction: Intravenous  Airway Management Planned: Oral ETT  Additional Equipment:   Intra-op Plan:   Post-operative Plan: Extubation in OR  Informed Consent: I have reviewed the patients History and Physical, chart, labs and discussed the procedure including the risks, benefits and alternatives for the proposed anesthesia with the patient or authorized representative who has indicated his/her understanding and acceptance.   Dental advisory given  Plan Discussed with: CRNA and Anesthesiologist  Anesthesia Plan Comments:         Anesthesia Quick Evaluation

## 2015-05-02 NOTE — H&P (View-Only) (Signed)
Subjective:   HPI  The patient is a 75 year old [name] who was admitted to the hospital with abdominal pain. He states he's been having some upper abdominal pains over the past month or so but then a few days ago the pain in the upper abdomen became worse. He subsequently came to the emergency room. He was found to have a lipase of 983. [place] blood cell count 15,100. Alkaline phosphatase 396, total bilirubin 5, AST 100, ALT 191. His bilirubin today has come down to 2.6. He had a CT scan showing gallstones and choledocholithiasis with dilated bile duct. He has noticed recently some dark-colored urine and light-colored stools. States his pain is a little better today but he still has some. Denies nausea or vomiting.  He has a history of an esophageal stricture dilated by Dr. Wynetta Emery in 2014. Review of barium swallow showed multiple esophageal diverticulum, and a hiatal hernia.  Review of Systems No chest pain  Past Medical History  Diagnosis Date  . Anxiety   . Tinnitus   . Diverticulosis   . Orthostatic hypotension    Past Surgical History  Procedure Laterality Date  . Tonsillectomy    . Esophagogastroduodenoscopy N/A 02/26/2012    Procedure: ESOPHAGOGASTRODUODENOSCOPY (EGD);  Surgeon: Garlan Fair, MD;  Location: [facility];  Service: Endoscopy;  Laterality: N/A;  . Balloon dilation N/A 02/26/2012    Procedure: BALLOON DILATION;  Surgeon: Garlan Fair, MD;  Location: [facility];  Service: Endoscopy;  Laterality: N/A;   Social History   Social History  . Marital Status: Married    Spouse Name: N/A  . Number of Children: N/A  . Years of Education: N/A   Occupational History  . Not on file.   Social History Main Topics  . Smoking status: Former Research scientist (life sciences)  . Smokeless tobacco: Not on file  . Alcohol Use: 1.8 oz/week    3 Shots of liquor per week     Comment: occasional  . Drug Use: No  . Sexual Activity: Not on file   Other Topics Concern  . Not on file   Social  History Narrative   family history includes Breast cancer in his mother; Cancer in his mother; Stroke in his mother. There is no history of Diabetes or Hypertension.  Current facility-administered medications:  .  0.9 %  sodium chloride infusion, , Intravenous, Continuous, Toy Baker, MD, Last Rate: 150 mL/hr at 04/29/Bobby 1136 .  acetaminophen (TYLENOL) tablet 650 mg, 650 mg, Oral, Q6H PRN **OR** acetaminophen (TYLENOL) suppository 650 mg, 650 mg, Rectal, Q6H PRN, Toy Baker, MD .  fludrocortisone (FLORINEF) tablet 0.1 mg, 0.1 mg, Oral, q morning - 10a, Toy Baker, MD, 0.1 mg at 04/29/Bobby 1046 .  HYDROmorphone (DILAUDID) injection 0.5 mg, 0.5 mg, Intravenous, Q4H PRN, Toy Baker, MD .  latanoprost (XALATAN) 0.005 % ophthalmic solution 1 drop, 1 drop, Both Eyes, QHS, Anastassia Doutova, MD .  ondansetron (ZOFRAN) tablet 4 mg, 4 mg, Oral, Q6H PRN **OR** ondansetron (ZOFRAN) injection 4 mg, 4 mg, Intravenous, Q6H PRN, Toy Baker, MD .  piperacillin-tazobactam (ZOSYN) IVPB 3.375 g, 3.375 g, Intravenous, Q8H, Toy Baker, MD, 3.375 g at 04/29/Bobby 0541 No Known Allergies   Objective:     BP 165/70 mmHg  Pulse 59  Temp(Src) 99 F (37.2 C) (Oral)  Resp 18  Ht 6\' 6"  (1.981 m)  Wt 95.709 kg (211 lb)  BMI 24.39 kg/m2  SpO2 98%  He is alert and oriented  No acute distress  Heart regular rhythm  no murmurs  Lungs clear  Abdomen: Bowel sounds normal, soft, there is some mild tenderness in the epigastrium  Laboratory No components found for: D1    Assessment:     Acute gallstone pancreatitis  Gallstones  Choledocholithiasis      Plan:     We will let the pancreatitis cool down. Continue medical management. We will plan for ERCP with sphincterotomy and stone extraction during the first part of the week. After that is accomplished we can ask surgery to see him in regards to cholecystectomy. I explained ERCP with sphincterotomy to him and  his wife. I explained the risks of the procedure including bleeding infection perforation and pancreatitis. He understands and is agreeable when the time is right.

## 2015-05-02 NOTE — Consult Note (Signed)
[facility] Surgery Consult Note  Bobby [name] [birth year]  [id number].    Requesting MD: Dr. Paulita [name] Chief Complaint/Reason for Consult:  Choledocholithiasis  HPI:  75 y/o [name] male with anxiety, tinnitus, early glaucoma, orthostatic hypotension, esophageal stricture who was admitted to the hospital on 04/30/15 with abdominal pain which he had been having since Thursday 04/28/15.  He states he's been having some upper abdominal pains over the past month, but then a few days ago the pain in the upper abdomen became worse.  He finally came to the hospital because of chills, dark-colored urine, and light-colored stools (these symptoms were more profound than the pain at the time of admission).  He subsequently came to [facility]. He was found to have a lipase of 983. [place] blood cell count 15,100. Alkaline phosphatase 396, total bilirubin 5, AST 100, ALT 191. His bilirubin today has come down to 1.8.  Pain is nearly resolved, no N/V, no diarrhea/constipation, no CP/SOB, or current fever/chills.    He has a history of an esophageal stricture dilated by Dr. Wynetta [name] in 2014. Review of barium swallow showed an esophageal stricture, multiple esophageal diverticulum, and a hiatal hernia.  He had a CT scan showing gallstones, choledocholithiasis with dilated bile duct, small hiatal hernia with a diverticulum, diffuse descending/sigmoid diverticulosis without diverticulitis.  US showed choledocholithiasis with dilated/obstructed biliary tree with cholelithiasis without cholecystitis.  ERCP was attempted by Dr. Paulita [name], but due to esophageal stricture he was unable to pass a scope into the stomach.  No h/o surgery in the past.   ROS: All systems reviewed and otherwise negative except for as above  Family History  Problem Relation Age of Onset  . Cancer Mother   . Breast cancer Mother   . Stroke Mother   . Diabetes Neg Hx   . Hypertension Neg Hx     Past Medical History  Diagnosis Date  . Anxiety   .  Tinnitus   . Diverticulosis   . Orthostatic hypotension     Past Surgical History  Procedure Laterality Date  . Tonsillectomy    . Esophagogastroduodenoscopy N/A 02/26/2012    Procedure: ESOPHAGOGASTRODUODENOSCOPY (EGD);  Surgeon: Bobby Fair, MD;  Location: [facility];  Service: Endoscopy;  Laterality: N/A;  . Balloon dilation N/A 02/26/2012    Procedure: BALLOON DILATION;  Surgeon: Bobby Fair, MD;  Location: [facility];  Service: Endoscopy;  Laterality: N/A;    Social History:  reports that he has quit smoking. He does not have any smokeless tobacco history on file. He reports that he drinks about 1.8 oz of alcohol per week. He reports that he does not use illicit drugs.  Allergies: No Known Allergies  Medications Prior to Admission  Medication Sig Dispense Refill  . acetaminophen (TYLENOL) 500 MG tablet Take 1,000-1,500 mg by mouth 2 (two) times daily.     . cholecalciferol (VITAMIN D) 1000 units tablet Take 1,000 Units by mouth daily.    . fludrocortisone (FLORINEF) 0.1 MG tablet Take 0.1 mg by mouth every morning.  0  . Multiple Vitamin (MULTIVITAMIN WITH MINERALS) TABS tablet Take 1 tablet by mouth daily.    Marland Kitchen omeprazole (PRILOSEC) 20 MG capsule Take 20 mg by mouth daily with breakfast.  2  . travoprost, benzalkonium, (TRAVATAN) 0.004 % ophthalmic solution Place 1 drop into both eyes at bedtime.      Blood pressure 160/64, pulse 55, temperature 98.5 F (36.9 C), temperature source Oral, resp. rate 10, height '6\' 6"'  (1.981 m), weight 211 lb (  95.709 kg), SpO2 100 %. Physical Exam: General: pleasant, WD/WN Seckinger male who is laying in bed in NAD HEENT: head is normocephalic, atraumatic.  Sclera are noninjected.  PERRL.  Ears and nose without any masses or lesions.  Mouth is pink and moist Heart: regular, rate, and rhythm.  No obvious murmurs, gallops, or rubs noted.  Palpable pedal pulses bilaterally Lungs: CTAB, no wheezes, rhonchi, or rales noted.  Respiratory effort  nonlabored Abd: soft, NT/ND, +BS, no masses, hernias, or organomegaly, no surgical scars MS: all 4 extremities are symmetrical with no cyanosis, clubbing, or edema. Skin: warm and dry with no masses, lesions, or rashes Psych: A&Ox3 with an appropriate affect.   Results for orders placed or performed during the hospital encounter of 04/29/15 (from the past 48 hour(s))  Basic metabolic panel     Status: Abnormal   Collection Time: 05/01/15  1:13 PM  Result Value Ref Range   Sodium 140 135 - 145 mmol/L   Potassium 3.7 3.5 - 5.1 mmol/L   Chloride 107 101 - 111 mmol/L   CO2 20 (L) 22 - 32 mmol/L   Glucose, Bld 71 65 - 99 mg/dL   BUN 20 6 - 20 mg/dL   Creatinine, Ser 0.95 0.61 - 1.24 mg/dL   Calcium 8.5 (L) 8.9 - 10.3 mg/dL   GFR calc non Af Amer >60 >60 mL/min   GFR calc Af Amer >60 >60 mL/min    Comment: (NOTE) The eGFR has been calculated using the CKD EPI equation. This calculation has not been validated in all clinical situations. eGFR's persistently <60 mL/min signify possible Chronic Kidney Disease.    Anion gap 13 5 - 15  CBC     Status: Abnormal   Collection Time: 05/02/15  4:22 AM  Result Value Ref Range   WBC 12.0 (H) 4.0 - 10.5 K/uL   RBC 3.49 (L) 4.22 - 5.81 MIL/uL   Hemoglobin 10.8 (L) 13.0 - 17.0 g/dL   HCT 31.6 (L) 39.0 - 52.0 %   MCV 90.5 78.0 - 100.0 fL   MCH 30.9 26.0 - 34.0 pg   MCHC 34.2 30.0 - 36.0 g/dL   RDW 12.6 11.5 - 15.5 %   Platelets 220 150 - 400 K/uL  Comprehensive metabolic panel     Status: Abnormal   Collection Time: 05/02/15  4:22 AM  Result Value Ref Range   Sodium 144 135 - 145 mmol/L   Potassium 3.8 3.5 - 5.1 mmol/L   Chloride 110 101 - 111 mmol/L   CO2 22 22 - 32 mmol/L   Glucose, Bld 69 65 - 99 mg/dL   BUN 20 6 - 20 mg/dL   Creatinine, Ser 0.99 0.61 - 1.24 mg/dL   Calcium 8.8 (L) 8.9 - 10.3 mg/dL   Total Protein 6.0 (L) 6.5 - 8.1 g/dL   Albumin 3.1 (L) 3.5 - 5.0 g/dL   AST 20 15 - 41 U/L   ALT 73 (H) 17 - 63 U/L   Alkaline  Phosphatase 221 (H) 38 - 126 U/L   Total Bilirubin 1.8 (H) 0.3 - 1.2 mg/dL   GFR calc non Af Amer >60 >60 mL/min   GFR calc Af Amer >60 >60 mL/min    Comment: (NOTE) The eGFR has been calculated using the CKD EPI equation. This calculation has not been validated in all clinical situations. eGFR's persistently <60 mL/min signify possible Chronic Kidney Disease.    Anion gap 12 5 - 15  Lipase, blood  Status: None   Collection Time: 05/02/15  4:22 AM  Result Value Ref Range   Lipase 33 11 - 51 U/L  Protime-INR     Status: None   Collection Time: 05/02/15  4:22 AM  Result Value Ref Range   Prothrombin Time 14.3 11.6 - 15.2 seconds   INR 1.09 0.00 - 1.49   No results found.    Assessment/Plan Gallstone pancreatitis Choledocholithiasis/cholelithiasis Esophageal stricture/diverticulum/hiatal hernia Leukocytosis Transaminitis Elevated bilirubin Diverticulum at the hiatal hernia  Plan:  1.  LFT's and WBC are luckily trending down.  Dr. Paulita [name] not able to perform ERCP due to esophageal stricture.  See barium swallow from Jan 2014 which shows esophageal stricture, diverticulum, and a hiatal hernia.  Dr. Paulita [name] is reaching out to a colleague at [facility] to see if they would be willing to perform the endoscopy and re-attempt the ERCP and stone removal.  If this can be done then the patient is more likely to be able to have laparoscopic cholecystectomy. 2.  If ERCP still not able to be accomplished he would need laparoscopic, but more likely open cholecystectomy and biliary duct exploration, and t-tube placement for 6 weeks or so.  This could either be done here or at [facility] 3.  We discussed risks of endoscopy including those with dilatation including tearing or perforation or the procedure being unsuccessful.  We discussed the differences in laparoscopic vs open cholecystectomy, length of anesthesia, biliary duct exploration, and what a t-tube would entail as well as post-operative recovery with  either option.   4.  The patient and his wife are waiting to hear back from Dr. Paulita [name] and his colleague at [facility] prior to making that decision.   Nat Christen, [facility] Surgery 05/02/2015, 1:52 PM Pager: [phone] (7am - 4:30pm M-F; 7am - 11:30am Sa/Su)

## 2015-05-02 NOTE — Transfer of Care (Signed)
Immediate Anesthesia Transfer of Care Note  Patient: Bobby [name]  Procedure(s) Performed: Procedure(s): ESOPHAGOGASTRODUODENOSCOPY (EGD) WITH PROPOFOL (N/A)  Patient Location: PACU and Endoscopy Unit  Anesthesia Type:General  Level of Consciousness: awake, alert , oriented and patient cooperative  Airway & Oxygen Therapy: Patient Spontanous Breathing and Patient connected to face mask oxygen  Post-op Assessment: Report given to RN, Post -op Vital signs reviewed and stable and Patient moving all extremities  Post vital signs: Reviewed and stable  Last Vitals:  Filed Vitals:   05/02/15 0557 05/02/15 1029  BP: 152/71 185/65  Pulse: 55 72  Temp: 36.8 C 37.4 C  Resp: 18 19    Last Pain:  Filed Vitals:   05/02/15 1037  PainSc: Asleep      Patients Stated Pain Goal: 0 ([id number])  Complications: No apparent anesthesia complications

## 2015-05-02 NOTE — Interval H&P Note (Signed)
History and Physical Interval Note:  05/02/2015 10:59 AM  [name]  has presented today for surgery, with the diagnosis of CBD Stones  The various methods of treatment have been discussed with the patient and family. After consideration of risks, benefits and other options for treatment, the patient has consented to  Procedure(s): ENDOSCOPIC RETROGRADE CHOLANGIOPANCREATOGRAPHY (ERCP) WITH PROPOFOL (N/A) as a surgical intervention .  The patient's history has been reviewed, patient examined, no change in status, stable for surgery.  I have reviewed the patient's chart and labs.  Questions were answered to the patient's satisfaction.     Laray Rivkin M  Assessment:  1.  Gallstone pancreatitis, clinically resolved and today's lipase normal. 2.  Choledocholithiasis, noted on CT scan. 3.  Gallstones.  Plan:  1.  Endoscopic retrograde cholangiopancreatography with possible biliary sphincterotomy and possible bile duct stone extraction. 2.  Risks (up to and including bleeding, infection, perforation, pancreatitis that can be complicated by infected necrosis and death), benefits (removal of stones, alleviating blockage, decreasing risk of cholangitis or choledocholithiasis-related pancreatitis), and alternatives (watchful waiting, percutaneous transhepatic cholangiography) of ERCP were explained to patient/family in detail and patient elects to proceed.

## 2015-05-02 NOTE — Anesthesia Postprocedure Evaluation (Signed)
Anesthesia Post Note  Patient: Bobby [name]  Procedure(s) Performed: Procedure(s) (LRB): ESOPHAGOGASTRODUODENOSCOPY (EGD) WITH PROPOFOL (N/A)  Patient location during evaluation: PACU Anesthesia Type: General Level of consciousness: awake Respiratory status: spontaneous breathing Cardiovascular status: stable Anesthetic complications: no    Last Vitals:  Filed Vitals:   05/02/15 1220 05/02/15 1230  BP: 156/65 160/64  Pulse: 54 55  Temp:    Resp: 8 10    Last Pain:  Filed Vitals:   05/02/15 1231  PainSc: Asleep                 EDWARDS,Celita Aron

## 2015-05-02 NOTE — Anesthesia Postprocedure Evaluation (Signed)
Anesthesia Post Note  Patient: Bobby [name]  Procedure(s) Performed: Procedure(s) (LRB): ESOPHAGOGASTRODUODENOSCOPY (EGD) WITH PROPOFOL (N/A)  Patient location during evaluation: PACU Anesthesia Type: MAC Level of consciousness: awake Pain management: pain level controlled Vital Signs Assessment: post-procedure vital signs reviewed and stable Respiratory status: spontaneous breathing Cardiovascular status: stable Anesthetic complications: no    Last Vitals:  Filed Vitals:   05/02/15 1029 05/02/15 1207  BP: 185/65 163/67  Pulse: 72 55  Temp: 37.4 C 36.9 C  Resp: 19 12    Last Pain:  Filed Vitals:   05/02/15 1208  PainSc: Asleep                 EDWARDS,Lollie Gunner

## 2015-05-02 NOTE — Progress Notes (Addendum)
PROGRESS NOTE    Bobby [name]  [id number] DOB: [birth year] DOA: 04/29/2015 PCP: Mathews Argyle, MD  Outpatient Specialists:  Brief Narrative: Bobby [name] is an 75 y.o. male past medical history of esophageal strictures and orthostatic hypotension who presents to the ED was 3 weeks of poor appetite and significant epigastric pain that does not improve with eating, his lipase was also thousand with elevation in his alkaline phosphatase and AST and ALT with a bilirubin of 5 CT scan of the abdomen and pelvis showed several stones in the common bile duct with mild dilation.  Assessment & Plan   Gallstone pancreatitis/choledocholithiais -CT abdomen shows dilatation of the common bile duct to 1.3 cm with 3 stones seen in the common bile duct, concerning for obstruction.  Multiple large stone seen in the gallbladder, mild intrahepatic biliary ductal dilatation -Upon admission : Lipase 983, AST 100, ALT 191- trending downard (WNL now)  -Gastroenterology consulted and appreciated  -Continue IV Zosyn -Plan for ERCP today  Leukocytosis -Secondary to the above, WBC trending downward -continue to monitor CBC  Orthostatic Hypotension -Continue Florinef  Asymptomatic bacteriuria  -Patient denies any urinary symptoms or dysuria  -Patient currently on IV Zosyn   Hypokalemia -Replaced, resolved -Continue to monitor BMP  Normocytic anemia -Drop in hemoglobin likely dilutional as patient was receiving IVF -Continue to monitor CBC  DVT Prophylaxis    Code Status: DNR  Family Communication: None at bedside  Disposition Plan: Admitted. Pending ERCP today  Consultants Gastroenterology  Procedures  None  Antibiotics   Anti-infectives    Start     Dose/Rate Route Frequency Ordered Stop   05/02/15 1100  ciprofloxacin (CIPRO) IVPB 400 mg     400 mg 200 mL/hr over 60 Minutes Intravenous Every 12 hours 05/02/15 1057     04/30/15 0600  piperacillin-tazobactam (ZOSYN) IVPB 3.375  g  Status:  Discontinued     3.375 g 100 mL/hr over 30 Minutes Intravenous Every 8 hours 04/30/15 0440 04/30/15 0454   04/30/15 0600  [MAR Hold]  piperacillin-tazobactam (ZOSYN) IVPB 3.375 g     (MAR Hold since 05/02/15 1013)   3.375 g 12.5 mL/hr over 240 Minutes Intravenous Every 8 hours 04/30/15 0454     04/29/15 2200  piperacillin-tazobactam (ZOSYN) IVPB 3.375 g     3.375 g 100 mL/hr over 30 Minutes Intravenous  Once 04/29/15 2158 04/30/15 0003      Subjective:   Bobby [name] seen and examined today.  Patient denies abdominal pain today.  Feels he has improved. Denies any chest pain, shortness of breath, nausea or vomiting.  Objective:   Filed Vitals:   05/01/15 1433 05/01/15 2213 05/02/15 0557 05/02/15 1029  BP: 164/71 158/75 152/71 185/65  Pulse: 55 51 55 72  Temp: 98.3 F (36.8 C) 98.5 F (36.9 C) 98.3 F (36.8 C) 99.4 F (37.4 C)  TempSrc: Oral Oral Oral Oral  Resp: 18 20 18 19   Height:      Weight:      SpO2: 98% 96% 96% 100%    Intake/Output Summary (Last 24 hours) at 05/02/15 1059 Last data filed at 05/02/15 0600  Gross per 24 hour  Intake    100 ml  Output      0 ml  Net    100 ml   Filed Weights   04/30/15 0447  Weight: 95.709 kg (211 lb)    Exam  General: Well developed, well nourished, NAD  HEENT: NCAT,  mucous membranes moist.   Cardiovascular: S1 S2  auscultated, RRR, no murmurs  Respiratory: Clear to auscultation bilaterally   Abdomen: Soft, nontender, nondistended, + bowel sounds  Extremities: warm dry without cyanosis clubbing or edema  Neuro: AAOx3, nonfocal  Psych: Normal affect and demeanor, pleasant   Data Reviewed: I have personally reviewed following labs and imaging studies  CBC:  Recent Labs Lab 04/29/15 1834 04/30/15 0504 05/02/15 0422  WBC 15.1* 13.6* 12.0*  HGB 12.6* 11.2* 10.8*  HCT 37.0* 33.2* 31.6*  MCV 91.4 91.0 90.5  PLT 190 200 [id number]   Basic Metabolic Panel:  Recent Labs Lab 04/29/15 1834  04/30/15 0504 05/01/15 1313 05/02/15 0422  NA 140 141 140 144  K 3.6 3.3* 3.7 3.8  CL 104 107 107 110  CO2 23 24 20* 22  GLUCOSE 134* 100* 71 69  BUN 24* 21* 20 20  CREATININE 1.21 1.21 0.95 0.99  CALCIUM 9.0 8.5* 8.5* 8.8*  MG  --  2.0  --   --   PHOS  --  3.4  --   --    GFR: Estimated Creatinine Clearance: 85.9 mL/min (by C-G formula based on Cr of 0.99). Liver Function Tests:  Recent Labs Lab 04/29/15 1834 04/30/15 0504 05/02/15 0422  AST 100* 59* 20  ALT 191* 146* 73*  ALKPHOS 396* 325* 221*  BILITOT 5.0* 2.6* 1.8*  PROT 7.0 6.2* 6.0*  ALBUMIN 4.0 3.5 3.1*    Recent Labs Lab 04/29/15 1834 05/02/15 0422  LIPASE 983* 33   No results for input(s): AMMONIA in the last 168 hours. Coagulation Profile:  Recent Labs Lab 05/02/15 0422  INR 1.09   Cardiac Enzymes: No results for input(s): CKTOTAL, CKMB, CKMBINDEX, TROPONINI in the last 168 hours. BNP (last 3 results) No results for input(s): PROBNP in the last 8760 hours. HbA1C: No results for input(s): HGBA1C in the last 72 hours. CBG: No results for input(s): GLUCAP in the last 168 hours. Lipid Profile: No results for input(s): CHOL, HDL, LDLCALC, TRIG, CHOLHDL, LDLDIRECT in the last 72 hours. Thyroid Function Tests:  Recent Labs  04/30/15 0504  TSH 1.404   Anemia Panel: No results for input(s): VITAMINB12, FOLATE, FERRITIN, TIBC, IRON, RETICCTPCT in the last 72 hours. Urine analysis:    Component Value Date/Time   COLORURINE ORANGE* 04/29/2015 2236   APPEARANCEUR TURBID* 04/29/2015 2236   LABSPEC 1.034* 04/29/2015 2236   PHURINE 5.5 04/29/2015 2236   GLUCOSEU NEGATIVE 04/29/2015 2236   HGBUR NEGATIVE 04/29/2015 2236   BILIRUBINUR LARGE* 04/29/2015 2236   KETONESUR 15* 04/29/2015 2236   PROTEINUR 30* 04/29/2015 2236   NITRITE POSITIVE* 04/29/2015 2236   LEUKOCYTESUR SMALL* 04/29/2015 2236   Sepsis Labs: @LABRCNTIP (procalcitonin:4,lacticidven:4)  )No results found for this or any previous  visit (from the past 240 hour(s)).    Radiology Studies: No results found.   Scheduled Meds: . [facility] Hold] antiseptic oral rinse  7 mL Mouth Rinse q12n4p  . [MAR Hold] chlorhexidine  15 mL Mouth Rinse BID  . ciprofloxacin  400 mg Intravenous Q12H  . [MAR Hold] fludrocortisone  0.1 mg Oral q morning - 10a  . [MAR Hold] latanoprost  1 drop Both Eyes QHS  . [MAR Hold] piperacillin-tazobactam (ZOSYN)  IV  3.375 g Intravenous Q8H   Continuous Infusions: . sodium chloride    . lactated ringers 1,000 mL (05/02/15 1039)     LOS: 2 days   Time Spent in minutes   30 minutes  Pallie Swigert D.O. on 05/02/2015 at 10:59 AM  Between 7am to 7pm -  Pager - [phone]  After 7pm go to [email or web address] - password [email or web address]  And look for the night coverage person covering for me after hours  [facility] Office  [phone]

## 2015-05-03 ENCOUNTER — Encounter (HOSPITAL_COMMUNITY): Payer: Self-pay | Admitting: Gastroenterology

## 2015-05-03 ENCOUNTER — Inpatient Hospital Stay (HOSPITAL_COMMUNITY): Payer: Medicare Other

## 2015-05-03 ENCOUNTER — Other Ambulatory Visit: Payer: Medicare Other

## 2015-05-03 ENCOUNTER — Inpatient Hospital Stay (HOSPITAL_COMMUNITY): Payer: Medicare Other | Admitting: Anesthesiology

## 2015-05-03 ENCOUNTER — Encounter (HOSPITAL_COMMUNITY)
Admission: EM | Disposition: A | Discharge status: Home / Self Care | Payer: Self-pay | Source: Home / Self Care | Attending: Internal Medicine

## 2015-05-03 DIAGNOSIS — D72829 Elevated white blood cell count, unspecified: Secondary | ICD-10-CM

## 2015-05-03 HISTORY — PX: CHOLECYSTECTOMY: SHX55

## 2015-05-03 HISTORY — PX: ERCP: SHX5425

## 2015-05-03 LAB — COMPREHENSIVE METABOLIC PANEL
ALK PHOS: 561 U/L — AB (ref 38–126)
ALT: 90 U/L — AB (ref 17–63)
AST: 60 U/L — AB (ref 15–41)
Albumin: 3.1 g/dL — ABNORMAL LOW (ref 3.5–5.0)
Anion gap: 12 (ref 5–15)
BUN: 16 mg/dL (ref 6–20)
CALCIUM: 8.9 mg/dL (ref 8.9–10.3)
CHLORIDE: 109 mmol/L (ref 101–111)
CO2: 22 mmol/L (ref 22–32)
CREATININE: 1.06 mg/dL (ref 0.61–1.24)
GFR calc Af Amer: >60 mL/min (ref >60)
GFR calc non Af Amer: >60 mL/min (ref >60)
GLUCOSE: 87 mg/dL (ref 65–99)
Potassium: 3.8 mmol/L (ref 3.5–5.1)
SODIUM: 143 mmol/L (ref 135–145)
Total Bilirubin: 2.2 mg/dL — ABNORMAL HIGH (ref 0.3–1.2)
Total Protein: 5.9 g/dL — ABNORMAL LOW (ref 6.5–8.1)

## 2015-05-03 LAB — CBC
HEMATOCRIT: 32.1 % — AB (ref 39.0–52.0)
HEMOGLOBIN: 11 g/dL — AB (ref 13.0–17.0)
MCH: 30.8 pg (ref 26.0–34.0)
MCHC: 34.3 g/dL (ref 30.0–36.0)
MCV: 89.9 fL (ref 78.0–100.0)
Platelets: 243 10*3/uL (ref 150–400)
RBC: 3.57 MIL/uL — ABNORMAL LOW (ref 4.22–5.81)
RDW: 12.6 % (ref 11.5–15.5)
WBC: 11.5 10*3/uL — ABNORMAL HIGH (ref 4.0–10.5)

## 2015-05-03 LAB — LIPASE, BLOOD: Lipase: 67 U/L — ABNORMAL HIGH (ref 11–51)

## 2015-05-03 LAB — SURGICAL PCR SCREEN
MRSA, PCR: NEGATIVE
STAPHYLOCOCCUS AUREUS: NEGATIVE

## 2015-05-03 SURGERY — ERCP, WITH INTERVENTION IF INDICATED
Anesthesia: General

## 2015-05-03 SURGERY — LAPAROSCOPIC CHOLECYSTECTOMY WITH INTRAOPERATIVE CHOLANGIOGRAM
Anesthesia: General | Site: Abdomen

## 2015-05-03 MED ORDER — LIDOCAINE HCL (CARDIAC) 20 MG/ML IV SOLN
INTRAVENOUS | Status: AC
  Filled 2015-05-03: qty 5

## 2015-05-03 MED ORDER — FENTANYL CITRATE (PF) 100 MCG/2ML IJ SOLN
INTRAMUSCULAR | Status: AC
  Filled 2015-05-03: qty 2

## 2015-05-03 MED ORDER — SUGAMMADEX SODIUM 200 MG/2ML IV SOLN
INTRAVENOUS | Status: AC
  Filled 2015-05-03: qty 2

## 2015-05-03 MED ORDER — MEPERIDINE HCL 50 MG/ML IJ SOLN: 6.2500 mg | INTRAMUSCULAR | Status: DC | PRN

## 2015-05-03 MED ORDER — FENTANYL CITRATE (PF) 100 MCG/2ML IJ SOLN
25.0000 ug | INTRAMUSCULAR | Status: DC | PRN
  Administered 2015-05-03 (×2): 50 ug via INTRAVENOUS

## 2015-05-03 MED ORDER — GLYCOPYRROLATE 0.2 MG/ML IJ SOLN
INTRAMUSCULAR | Status: AC
  Filled 2015-05-03: qty 1

## 2015-05-03 MED ORDER — MIDAZOLAM HCL 5 MG/5ML IJ SOLN
INTRAMUSCULAR | Status: DC | PRN
  Administered 2015-05-03: 2 mg via INTRAVENOUS

## 2015-05-03 MED ORDER — LACTATED RINGERS IV SOLN
INTRAVENOUS | Status: DC | PRN
  Administered 2015-05-03 (×2): via INTRAVENOUS

## 2015-05-03 MED ORDER — IOPAMIDOL (ISOVUE-300) INJECTION 61%
INTRAVENOUS | Status: DC | PRN
  Administered 2015-05-03: 10 mL via INTRAVENOUS

## 2015-05-03 MED ORDER — KCL IN DEXTROSE-NACL 20-5-0.45 MEQ/L-%-% IV SOLN
INTRAVENOUS | Status: DC
  Administered 2015-05-03 – 2015-05-05 (×6): via INTRAVENOUS
  Filled 2015-05-03 (×8): qty 1000

## 2015-05-03 MED ORDER — FENTANYL CITRATE (PF) 250 MCG/5ML IJ SOLN
INTRAMUSCULAR | Status: AC
  Filled 2015-05-03: qty 5

## 2015-05-03 MED ORDER — SODIUM CHLORIDE 0.9 % IV SOLN
INTRAVENOUS | Status: DC | PRN
  Administered 2015-05-03: 40 mL

## 2015-05-03 MED ORDER — KCL IN DEXTROSE-NACL 20-5-0.45 MEQ/L-%-% IV SOLN
INTRAVENOUS | Status: AC
  Filled 2015-05-03: qty 1000

## 2015-05-03 MED ORDER — LACTATED RINGERS IV SOLN: INTRAVENOUS | Status: DC

## 2015-05-03 MED ORDER — DEXAMETHASONE SODIUM PHOSPHATE 10 MG/ML IJ SOLN
INTRAMUSCULAR | Status: DC | PRN
  Administered 2015-05-03: 10 mg via INTRAVENOUS

## 2015-05-03 MED ORDER — DEXAMETHASONE SODIUM PHOSPHATE 10 MG/ML IJ SOLN
INTRAMUSCULAR | Status: AC
  Filled 2015-05-03: qty 1

## 2015-05-03 MED ORDER — IOPAMIDOL (ISOVUE-300) INJECTION 61%
INTRAVENOUS | Status: AC
  Filled 2015-05-03: qty 50

## 2015-05-03 MED ORDER — BUPIVACAINE HCL (PF) 0.5 % IJ SOLN
INTRAMUSCULAR | Status: DC | PRN
  Administered 2015-05-03: 30 mL

## 2015-05-03 MED ORDER — SUCCINYLCHOLINE CHLORIDE 20 MG/ML IJ SOLN
INTRAMUSCULAR | Status: DC | PRN
  Administered 2015-05-03: 100 mg via INTRAVENOUS

## 2015-05-03 MED ORDER — FENTANYL CITRATE (PF) 100 MCG/2ML IJ SOLN
INTRAMUSCULAR | Status: DC | PRN
  Administered 2015-05-03 (×5): 50 ug via INTRAVENOUS

## 2015-05-03 MED ORDER — GLYCOPYRROLATE 0.2 MG/ML IJ SOLN
INTRAMUSCULAR | Status: DC | PRN
  Administered 2015-05-03: 0.2 mg via INTRAVENOUS

## 2015-05-03 MED ORDER — MIDAZOLAM HCL 2 MG/2ML IJ SOLN
INTRAMUSCULAR | Status: AC
  Filled 2015-05-03: qty 2

## 2015-05-03 MED ORDER — BUPIVACAINE HCL (PF) 0.25 % IJ SOLN: INTRAMUSCULAR | Status: DC | PRN

## 2015-05-03 MED ORDER — ONDANSETRON HCL 4 MG/2ML IJ SOLN
INTRAMUSCULAR | Status: AC
  Filled 2015-05-03: qty 2

## 2015-05-03 MED ORDER — PIPERACILLIN-TAZOBACTAM 3.375 G IVPB
INTRAVENOUS | Status: AC
  Filled 2015-05-03: qty 50

## 2015-05-03 MED ORDER — PROPOFOL 10 MG/ML IV BOLUS
INTRAVENOUS | Status: DC | PRN
  Administered 2015-05-03: 200 mg via INTRAVENOUS

## 2015-05-03 MED ORDER — HYDROCODONE-ACETAMINOPHEN 5-325 MG PO TABS
1.0000 | ORAL_TABLET | ORAL | Status: DC | PRN
Start: 2015-05-03 — End: 2015-05-05
  Administered 2015-05-03 – 2015-05-04 (×3): 1 via ORAL
  Filled 2015-05-03 (×3): qty 1

## 2015-05-03 MED ORDER — EPINEPHRINE HCL 0.1 MG/ML IJ SOSY
PREFILLED_SYRINGE | INTRAMUSCULAR | Status: AC
  Filled 2015-05-03: qty 20

## 2015-05-03 MED ORDER — ONDANSETRON HCL 4 MG/2ML IJ SOLN
INTRAMUSCULAR | Status: DC | PRN
  Administered 2015-05-03: 4 mg via INTRAVENOUS

## 2015-05-03 MED ORDER — LIDOCAINE HCL (CARDIAC) 20 MG/ML IV SOLN
INTRAVENOUS | Status: DC | PRN
  Administered 2015-05-03: 100 mg via INTRAVENOUS

## 2015-05-03 MED ORDER — TISSEEL VH 10 ML EX KIT
PACK | CUTANEOUS | Status: AC
Start: 2015-05-03 — End: 2015-05-03
  Filled 2015-05-03: qty 1

## 2015-05-03 MED ORDER — PROPOFOL 10 MG/ML IV BOLUS
INTRAVENOUS | Status: AC
Start: 2015-05-03 — End: 2015-05-03
  Filled 2015-05-03: qty 20

## 2015-05-03 MED ORDER — SUGAMMADEX SODIUM 200 MG/2ML IV SOLN
INTRAVENOUS | Status: DC | PRN
  Administered 2015-05-03: 200 mg via INTRAVENOUS

## 2015-05-03 MED ORDER — MORPHINE SULFATE (PF) 2 MG/ML IV SOLN: 1.0000 mg | INTRAVENOUS | Status: DC | PRN

## 2015-05-03 MED ORDER — ROCURONIUM BROMIDE 100 MG/10ML IV SOLN
INTRAVENOUS | Status: DC | PRN
  Administered 2015-05-03: 10 mg via INTRAVENOUS
  Administered 2015-05-03: 20 mg via INTRAVENOUS
  Administered 2015-05-03: 10 mg via INTRAVENOUS
  Administered 2015-05-03: 40 mg via INTRAVENOUS

## 2015-05-03 MED ORDER — BUPIVACAINE HCL (PF) 0.5 % IJ SOLN
INTRAMUSCULAR | Status: AC
  Filled 2015-05-03: qty 30

## 2015-05-03 MED ORDER — 0.9 % SODIUM CHLORIDE (POUR BTL) OPTIME
TOPICAL | Status: DC | PRN
Start: 2015-05-03 — End: 2015-05-03
  Administered 2015-05-03: 1000 mL

## 2015-05-03 MED ORDER — ROCURONIUM BROMIDE 100 MG/10ML IV SOLN
INTRAVENOUS | Status: AC
  Filled 2015-05-03: qty 1

## 2015-05-03 MED ORDER — HEPARIN SODIUM (PORCINE) 5000 UNIT/ML IJ SOLN
5000.0000 [IU] | Freq: Three times a day (TID) | INTRAMUSCULAR | Status: DC
  Administered 2015-05-04 – 2015-05-05 (×4): 5000 [IU] via SUBCUTANEOUS
  Filled 2015-05-03 (×5): qty 1

## 2015-05-03 MED ORDER — LACTATED RINGERS IR SOLN
Status: DC | PRN
  Administered 2015-05-03: 4000 mL

## 2015-05-03 SURGICAL SUPPLY — 52 items
APPLIER CLIP 5 13 M/L LIGAMAX5 (MISCELLANEOUS)
APPLIER CLIP ROT 10 11.4 M/L (STAPLE) ×4
BENZOIN TINCTURE PRP APPL 2/3 (GAUZE/BANDAGES/DRESSINGS) IMPLANT
CABLE HIGH FREQUENCY MONO STRZ (ELECTRODE) ×4 IMPLANT
CHLORAPREP W/TINT 26ML (MISCELLANEOUS) ×4 IMPLANT
CHOLANGIOGRAM CATH TAUT (CATHETERS) ×4 IMPLANT
CLIP APPLIE 5 13 M/L LIGAMAX5 (MISCELLANEOUS) IMPLANT
CLIP APPLIE ROT 10 11.4 M/L (STAPLE) ×2 IMPLANT
CLOSURE WOUND 1/4X4 (GAUZE/BANDAGES/DRESSINGS)
COVER MAYO STAND STRL (DRAPES) ×4 IMPLANT
COVER SURGICAL LIGHT HANDLE (MISCELLANEOUS) ×4 IMPLANT
DECANTER SPIKE VIAL GLASS SM (MISCELLANEOUS) IMPLANT
DEVICE SUTURE ENDOST 10MM (ENDOMECHANICALS) ×4 IMPLANT
DEVICE TROCAR PUNCTURE CLOSURE (ENDOMECHANICALS) ×4 IMPLANT
DRAPE C-ARM 42X120 X-RAY (DRAPES) ×4 IMPLANT
DRAPE LAPAROSCOPIC ABDOMINAL (DRAPES) ×4 IMPLANT
ELECT REM PT RETURN 9FT ADLT (ELECTROSURGICAL) ×4
ELECTRODE REM PT RTRN 9FT ADLT (ELECTROSURGICAL) ×2 IMPLANT
ENDOLOOP SUT PDS II  0 18 (SUTURE) ×2
ENDOLOOP SUT PDS II 0 18 (SUTURE) ×2 IMPLANT
GLOVE SURG SIGNA 7.5 PF LTX (GLOVE) ×4 IMPLANT
GOWN STRL REUS W/TWL XL LVL3 (GOWN DISPOSABLE) ×24 IMPLANT
HEMOSTAT SURGICEL 4X8 (HEMOSTASIS) IMPLANT
IV CATH 14GX2 1/4 (CATHETERS) ×4 IMPLANT
IV SET EXTENSION CATH 6 NF (IV SETS) ×4 IMPLANT
KIT BASIN OR (CUSTOM PROCEDURE TRAY) ×4 IMPLANT
LIQUID BAND (GAUZE/BANDAGES/DRESSINGS) ×4 IMPLANT
POSITIONER SURGICAL ARM (MISCELLANEOUS) IMPLANT
POUCH SPECIMEN RETRIEVAL 10MM (ENDOMECHANICALS) ×4 IMPLANT
RELOAD ENDO STITCH 2.0 (ENDOMECHANICALS) ×16
SCISSORS LAP 5X35 DISP (ENDOMECHANICALS) ×4 IMPLANT
SET IRRIG TUBING LAPAROSCOPIC (IRRIGATION / IRRIGATOR) ×4 IMPLANT
SLEEVE ADV FIXATION 5X100MM (TROCAR) ×4 IMPLANT
SLEEVE XCEL OPT CAN 5 100 (ENDOMECHANICALS) ×4 IMPLANT
STOPCOCK 4 WAY LG BORE MALE ST (IV SETS) ×4 IMPLANT
STRIP CLOSURE SKIN 1/4X4 (GAUZE/BANDAGES/DRESSINGS) IMPLANT
SUT MNCRL AB 4-0 PS2 18 (SUTURE) ×4 IMPLANT
SUT NOVA NAB GS-21 1 T12 (SUTURE) ×4 IMPLANT
SUT RELOAD ENDO STITCH 2 48X1 (ENDOMECHANICALS) ×16
SUT VIC AB 2-0 SH 27 (SUTURE) ×2
SUT VIC AB 2-0 SH 27X BRD (SUTURE) ×2 IMPLANT
SUTURE RELOAD END STTCH 2 48X1 (ENDOMECHANICALS) ×16 IMPLANT
TAPE CLOTH 4X10 WHT NS (GAUZE/BANDAGES/DRESSINGS) ×4 IMPLANT
TOWEL OR 17X26 10 PK STRL BLUE (TOWEL DISPOSABLE) ×4 IMPLANT
TRAY LAPAROSCOPIC (CUSTOM PROCEDURE TRAY) ×4 IMPLANT
TROCAR ADV FIXATION 11X100MM (TROCAR) ×4 IMPLANT
TROCAR ADV FIXATION 5X100MM (TROCAR) ×4 IMPLANT
TROCAR BLADELESS 15MM (ENDOMECHANICALS) ×4 IMPLANT
TROCAR BLADELESS OPT 5 100 (ENDOMECHANICALS) IMPLANT
TROCAR XCEL BLUNT TIP 100MML (ENDOMECHANICALS) ×4 IMPLANT
TROCAR XCEL NON-BLD 11X100MML (ENDOMECHANICALS) IMPLANT
TUBING INSUF HEATED (TUBING) ×4 IMPLANT

## 2015-05-03 NOTE — Progress Notes (Signed)
Initial Nutrition Assessment  DOCUMENTATION CODES:   Not applicable  INTERVENTION:  -RD continue to monitor   NUTRITION DIAGNOSIS:   Inadequate oral intake related to acute illness as evidenced by per patient/family report.  GOAL:   Patient will meet greater than or equal to 90% of their needs  MONITOR:   Diet advancement, Labs, Skin, I & O's, Weight trends  REASON FOR ASSESSMENT:   Malnutrition Screening Tool    ASSESSMENT:   Bobby [name] is a 75 y.o. male with medical history significant of esophageal stricture and Orthostatic hypotension Presented with 2-3 weeks of not feeling well poor appetite. For the past few days have had significant epigastric pain  Spoke with Bobby [name] and his wife at bedside. They are pleasant couple who, iterate poor appetite since approximately last Thursday when epigastric pain and inability to eat set in. He endorses a 5-10# wt loss over 1-2 months. He states "I cut out the cookies before bed," in addition to his acute illness.  Per chart, patient's weight is down 9# from previous admission 02/2012.  Prior to acute illness, patient states that he was eating well and has maintained his weight for many years. He does have a history of esophageal stricture for which he had it stretched 3-4 years ago.  Pt is currently NPO. He got a call during my visit and will go into surgery this afternoon. He and his wife did express some interest in soft foods, and diet education following surgery. Told him to let his MD or RN know and they will consult [place] to chat about it.  Labs and Medications reviewed.  Diet Order:  Diet NPO time specified  Skin:  Reviewed, no issues  Last BM:  5/28  Height:   Ht Readings from Last 1 Encounters:  04/30/15 6\' 6"  (1.981 m)    Weight:   Wt Readings from Last 1 Encounters:  04/30/15 211 lb (95.709 kg)    Ideal Body Weight:  97.27 kg  BMI:  Body mass index is 24.39 kg/(m^2).  Estimated Nutritional Needs:    Kcal:  2400-2800 calories  Protein:  95-115 grams  Fluid:  >/= 2.4L  EDUCATION NEEDS:   No education needs identified at this time  Satira Anis. Cheyla Duchemin, MS, RD LDN After Hours/Weekend Pager [phone]

## 2015-05-03 NOTE — Anesthesia Postprocedure Evaluation (Signed)
Anesthesia Post Note  Patient: Maximilien Gines  Procedure(s) Performed: Procedure(s) (LRB): LAPAROSCOPIC CHOLECYSTECTOMY WITH INTRAOPERATIVE CHOLANGIOGRAM , LAPAROSCOPIC GASTROTOMY   (N/A) ENDOSCOPIC RETROGRADE CHOLANGIOPANCREATOGRAPHY (ERCP) (N/A)  Patient location during evaluation: PACU Anesthesia Type: General Level of consciousness: awake and alert Pain management: pain level controlled Vital Signs Assessment: post-procedure vital signs reviewed and stable Respiratory status: spontaneous breathing, nonlabored ventilation, respiratory function stable and patient connected to nasal cannula oxygen Cardiovascular status: blood pressure returned to baseline and stable Postop Assessment: no signs of nausea or vomiting Anesthetic complications: no    Last Vitals:  Filed Vitals:   05/03/15 1831 05/03/15 2038  BP: 170/77 176/84  Pulse: 67 68  Temp: 36.7 C 37.2 C  Resp: 16 18    Last Pain:  Filed Vitals:   05/03/15 2041  PainSc: 7                  Marilyn Nihiser,[place] L

## 2015-05-03 NOTE — Progress Notes (Signed)
PROGRESS NOTE    Bobby [name]  [id number] DOB: 1940/10/09 DOA: 04/29/2015 PCP: Mathews Argyle, MD  Outpatient Specialists:  Brief Narrative: Bobby [name] is an 75 y.o. male past medical history of esophageal strictures and orthostatic hypotension who presents to the ED was 3 weeks of poor appetite and significant epigastric pain that does not improve with eating, his lipase was also thousand with elevation in his alkaline phosphatase and AST and ALT with a bilirubin of 5 CT scan of the abdomen and pelvis showed several stones in the common bile duct with mild dilation.  Assessment & Plan   Gallstone pancreatitis/choledocholithiais -CT abdomen shows dilatation of the common bile duct to 1.3 cm with 3 stones seen in the common bile duct, concerning for obstruction.  Multiple large stone seen in the gallbladder, mild intrahepatic biliary ductal dilatation -Upon admission : Lipase 983, AST 100, ALT 191 -Gastroenterology consulted and appreciated  -Continue IV Zosyn -EGD on 05/02/2015: multiple diverticula in lower 1/3 of the esophagus, luminal stenosis GE junction. Unable to pass diagnostic endoscope into the stomach -General surgery consulted and appreciated -Plan for tentative surgery today  Leukocytosis -Secondary to the above, WBC trending downward -continue to monitor CBC  Orthostatic Hypotension -Continue Florinef  Asymptomatic bacteriuria  -Patient denies any urinary symptoms or dysuria  -Patient currently on IV Zosyn   Hypokalemia -Replaced, resolved -Continue to monitor BMP  Normocytic anemia -Drop in hemoglobin likely dilutional as patient was receiving IVF -Continue to monitor CBC  DVT Prophylaxis  SCDs  Code Status: DNR  Family Communication: Wife at bedside  Disposition Plan: Admitted. Pending surgery today  Consultants Gastroenterology General surgery  Procedures  EGD  Antibiotics   Anti-infectives    Start     Dose/Rate Route Frequency  Ordered Stop   05/02/15 1100  ciprofloxacin (CIPRO) IVPB 400 mg     400 mg 200 mL/hr over 60 Minutes Intravenous Every 12 hours 05/02/15 1057     04/30/15 0600  piperacillin-tazobactam (ZOSYN) IVPB 3.375 g  Status:  Discontinued     3.375 g 100 mL/hr over 30 Minutes Intravenous Every 8 hours 04/30/15 0440 04/30/15 0454   04/30/15 0600  piperacillin-tazobactam (ZOSYN) IVPB 3.375 g     3.375 g 12.5 mL/hr over 240 Minutes Intravenous Every 8 hours 04/30/15 0454     04/29/15 2200  piperacillin-tazobactam (ZOSYN) IVPB 3.375 g     3.375 g 100 mL/hr over 30 Minutes Intravenous  Once 04/29/15 2158 04/30/15 0003      Subjective:   Bobby [name] seen and examined today.  Patient denies abdominal pain today.  Felt some pain after having chicken broth yesterday.  Denies any chest pain, shortness of breath, nausea or vomiting.  Objective:   Filed Vitals:   05/02/15 1230 05/02/15 1500 05/02/15 2301 05/03/15 0453  BP: 160/64 147/74 149/74 151/77  Pulse: 55 63 51 48  Temp:  98.7 F (37.1 C) 99.6 F (37.6 C) 98.9 F (37.2 C)  TempSrc:  Oral Oral Oral  Resp: 10 18 18 18   Height:      Weight:      SpO2: 100% 100% 94% 96%    Intake/Output Summary (Last 24 hours) at 05/03/15 1205 Last data filed at 05/03/15 0700  Gross per 24 hour  Intake   1150 ml  Output      0 ml  Net   1150 ml   Filed Weights   04/30/15 0447  Weight: 95.709 kg (211 lb)    Exam  General: Well  developed, well nourished, NAD  HEENT: NCAT,  mucous membranes moist.   Cardiovascular: S1 S2 auscultated, RRR, no murmurs  Respiratory: Clear to auscultation bilaterally   Abdomen: Soft, mild epigastric TTP, nondistended, + bowel sounds  Extremities: warm dry without cyanosis clubbing or edema  Neuro: AAOx3, nonfocal  Psych: Normal affect and demeanor, pleasant   Data Reviewed: I have personally reviewed following labs and imaging studies  CBC:  Recent Labs Lab 04/29/15 1834 04/30/15 0504 05/02/15 0422  05/03/15 0514  WBC 15.1* 13.6* 12.0* 11.5*  HGB 12.6* 11.2* 10.8* 11.0*  HCT 37.0* 33.2* 31.6* 32.1*  MCV 91.4 91.0 90.5 89.9  PLT 190 200 220 0000000   Basic Metabolic Panel:  Recent Labs Lab 04/29/15 1834 04/30/15 0504 05/01/15 1313 05/02/15 0422 05/03/15 0514  NA 140 141 140 144 143  K 3.6 3.3* 3.7 3.8 3.8  CL 104 107 107 110 109  CO2 23 24 20* 22 22  GLUCOSE 134* 100* 71 69 87  BUN 24* 21* 20 20 16   CREATININE 1.21 1.21 0.95 0.99 1.06  CALCIUM 9.0 8.5* 8.5* 8.8* 8.9  MG  --  2.0  --   --   --   PHOS  --  3.4  --   --   --    GFR: Estimated Creatinine Clearance: 80.2 mL/min (by C-G formula based on Cr of 1.06). Liver Function Tests:  Recent Labs Lab 04/29/15 1834 04/30/15 0504 05/02/15 0422 05/03/15 0514  AST 100* 59* 20 60*  ALT 191* 146* 73* 90*  ALKPHOS 396* 325* 221* 561*  BILITOT 5.0* 2.6* 1.8* 2.2*  PROT 7.0 6.2* 6.0* 5.9*  ALBUMIN 4.0 3.5 3.1* 3.1*    Recent Labs Lab 04/29/15 1834 05/02/15 0422 05/03/15 0514  LIPASE 983* 33 67*   No results for input(s): AMMONIA in the last 168 hours. Coagulation Profile:  Recent Labs Lab 05/02/15 0422  INR 1.09   Cardiac Enzymes: No results for input(s): CKTOTAL, CKMB, CKMBINDEX, TROPONINI in the last 168 hours. BNP (last 3 results) No results for input(s): PROBNP in the last 8760 hours. HbA1C: No results for input(s): HGBA1C in the last 72 hours. CBG: No results for input(s): GLUCAP in the last 168 hours. Lipid Profile: No results for input(s): CHOL, HDL, LDLCALC, TRIG, CHOLHDL, LDLDIRECT in the last 72 hours. Thyroid Function Tests: No results for input(s): TSH, T4TOTAL, FREET4, T3FREE, THYROIDAB in the last 72 hours. Anemia Panel: No results for input(s): VITAMINB12, FOLATE, FERRITIN, TIBC, IRON, RETICCTPCT in the last 72 hours. Urine analysis:    Component Value Date/Time   COLORURINE ORANGE* 04/29/2015 2236   APPEARANCEUR TURBID* 04/29/2015 2236   LABSPEC 1.034* 04/29/2015 2236   PHURINE 5.5  04/29/2015 2236   GLUCOSEU NEGATIVE 04/29/2015 2236   HGBUR NEGATIVE 04/29/2015 2236   BILIRUBINUR LARGE* 04/29/2015 2236   KETONESUR 15* 04/29/2015 2236   PROTEINUR 30* 04/29/2015 2236   NITRITE POSITIVE* 04/29/2015 2236   LEUKOCYTESUR SMALL* 04/29/2015 2236   Sepsis Labs: @LABRCNTIP (procalcitonin:4,lacticidven:4)  )No results found for this or any previous visit (from the past 240 hour(s)).    Radiology Studies: No results found.   Scheduled Meds: . antiseptic oral rinse  7 mL Mouth Rinse q12n4p  . chlorhexidine  15 mL Mouth Rinse BID  . ciprofloxacin  400 mg Intravenous Q12H  . fludrocortisone  0.1 mg Oral q morning - 10a  . latanoprost  1 drop Both Eyes QHS  . piperacillin-tazobactam (ZOSYN)  IV  3.375 g Intravenous Q8H   Continuous Infusions: .  dextrose 5 % and 0.45 % NaCl with KCl 20 mEq/L       LOS: 3 days   Time Spent in minutes   30 minutes  Bobby [name] D.O. on 05/03/2015 at 12:05 PM  Between 7am to 7pm - Pager - [phone]  After 7pm go to [email or web address] - password [email or web address]  And look for the night coverage person covering for me after hours  [facility] Office  [phone]

## 2015-05-03 NOTE — Progress Notes (Signed)
Subjective: Had a little bit of pain after eating yesterday.  Objective: Vital signs in last 24 hours: Temp:  [98.5 F (36.9 C)-99.6 F (37.6 C)] 98.9 F (37.2 C) (05/02 0453) Pulse Rate:  [48-72] 48 (05/02 0453) Resp:  [8-19] 18 (05/02 0453) BP: (147-185)/(64-77) 151/77 mmHg (05/02 0453) SpO2:  [94 %-100 %] 96 % (05/02 0453) Weight change:  Last BM Date: 05/29/15  PE: GEN:  NAD ABD:  Soft  Lab Results: CBC    Component Value Date/Time   WBC 11.5* 05/03/2015 0514   RBC 3.57* 05/03/2015 0514   HGB 11.0* 05/03/2015 0514   HCT 32.1* 05/03/2015 0514   PLT 243 05/03/2015 0514   MCV 89.9 05/03/2015 0514   MCH 30.8 05/03/2015 0514   MCHC 34.3 05/03/2015 0514   RDW 12.6 05/03/2015 0514   CMP     Component Value Date/Time   NA 143 05/03/2015 0514   K 3.8 05/03/2015 0514   CL 109 05/03/2015 0514   CO2 22 05/03/2015 0514   GLUCOSE 87 05/03/2015 0514   BUN 16 05/03/2015 0514   CREATININE 1.06 05/03/2015 0514   CALCIUM 8.9 05/03/2015 0514   PROT 5.9* 05/03/2015 0514   ALBUMIN 3.1* 05/03/2015 0514   AST 60* 05/03/2015 0514   ALT 90* 05/03/2015 0514   ALKPHOS 561* 05/03/2015 0514   BILITOT 2.2* 05/03/2015 0514   GFRNONAA >60 05/03/2015 0514   GFRAA >60 05/03/2015 0514   Assessment:  1.  Gallstone pancreatitis. 2.  Gallstones. 3.  Bile duct stones. 4.  Esophageal diverticulosis, extensive, unable to pass endoscope into stomach.  Plan:  1.  Intraoperative bile duct exploration versus ERCP with surgical access to stomach are possible options.  I don't feel comfortable proceeding with ERCP via surgical access to stomach; surgeons are discussing with Dr. Benson Norway. 2.  I will discuss case with Dr. Lysle Rubens at [facility], to see if other options are available, and if so will let Dr. Dalbert Batman know. 3.  Will sign-off; please call with questions; thank you for the consult.   Landry Dyke 05/03/2015, 8:59 AM   Pager [phone] If no answer or after 5 PM call [phone]

## 2015-05-03 NOTE — Anesthesia Procedure Notes (Signed)
Procedure Name: Intubation Date/Time: 05/03/2015 2:10 PM Performed by: Lind Covert Pre-anesthesia Checklist: Patient identified, Emergency Drugs available, Suction available, Patient being monitored and Timeout performed Patient Re-evaluated:Patient Re-evaluated prior to inductionOxygen Delivery Method: Circle system utilized Preoxygenation: Pre-oxygenation with 100% oxygen Intubation Type: IV induction Laryngoscope Size: Mac and 4 Grade View: Grade I Tube type: Oral Tube size: 7.5 mm Number of attempts: 1 Airway Equipment and Method: Stylet Placement Confirmation: ETT inserted through vocal cords under direct vision,  breath sounds checked- equal and bilateral and positive ETCO2 Secured at: 22 cm Tube secured with: Tape Dental Injury: Teeth and Oropharynx as per pre-operative assessment

## 2015-05-03 NOTE — Anesthesia Preprocedure Evaluation (Addendum)
Anesthesia Evaluation  Patient identified by MRN, date of birth, ID band Patient awake    Reviewed: Allergy & Precautions, H&P , NPO status , Patient's Chart, lab work & pertinent test results  Airway Mallampati: II  TM Distance: >3 FB Neck ROM: full    Dental no notable dental hx. (+) Dental Advisory Given, Teeth Intact   Pulmonary neg pulmonary ROS, former smoker,    Pulmonary exam normal breath sounds clear to auscultation       Cardiovascular Exercise Tolerance: Good negative cardio ROS Normal cardiovascular exam Rhythm:regular Rate:Normal  Orthostatic hypotension   Neuro/Psych PSYCHIATRIC DISORDERS Anxiety negative neurological ROS  negative psych ROS   GI/Hepatic Neg liver ROS, GERD  Medicated and Controlled,Gallstone pancreatitis   Endo/Other  negative endocrine ROS  Renal/GU negative Renal ROS  negative genitourinary   Musculoskeletal negative musculoskeletal ROS (+)   Abdominal   Peds negative pediatric ROS (+)  Hematology negative hematology ROS (+)   Anesthesia Other Findings   Reproductive/Obstetrics negative OB ROS                            Lab Results  Component Value Date   WBC 11.5* 05/03/2015   HGB 11.0* 05/03/2015   HCT 32.1* 05/03/2015   MCV 89.9 05/03/2015   PLT 243 05/03/2015   Lab Results  Component Value Date   CREATININE 1.06 05/03/2015   BUN 16 05/03/2015   NA 143 05/03/2015   K 3.8 05/03/2015   CL 109 05/03/2015   CO2 22 05/03/2015   Lab Results  Component Value Date   INR 1.09 05/02/2015     Anesthesia Physical Anesthesia Plan  ASA: III  Anesthesia Plan: General   Post-op Pain Management:    Induction: Intravenous  Airway Management Planned: Oral ETT  Additional Equipment:   Intra-op Plan:   Post-operative Plan: Extubation in OR  Informed Consent: I have reviewed the patients History and Physical, chart, labs and discussed the  procedure including the risks, benefits and alternatives for the proposed anesthesia with the patient or authorized representative who has indicated his/her understanding and acceptance.   Dental advisory given  Plan Discussed with: CRNA  Anesthesia Plan Comments:        Anesthesia Quick Evaluation

## 2015-05-03 NOTE — Op Note (Addendum)
04/29/2015 - 05/03/2015  3:58 PM  PATIENT:  Bobby [name], 75 y.o., male, MRN: [id number]  PREOP DIAGNOSIS:  Choledocholithiasis, chronic cholecystitis, cholelihtiasis  POSTOP DIAGNOSIS:   Choledocholithiasis, chronic cholecystitis, cholelihtiasis  PROCEDURE:   Procedure(s): LAPAROSCOPIC CHOLECYSTECTOMY WITH INTRAOPERATIVE CHOLANGIOGRAM , placement of wire in CBD, LAPAROSCOPIC GASTROTOMY for ERCP scope - D. Jamesen Stahnke  (photos in chart)  ENDOSCOPIC RETROGRADE CHOLANGIOPANCREATOGRAPHY (ERCP) - P. Hung  SURGEON:   Alphonsa Overall, M.D.  ASSISTANTRockne Coons, M.D.  ANESTHESIA:   general  Anesthesiologist: Rod Mae, MD CRNA: Lind Covert, CRNA  General  ASA: 3  EBL:  minimal  ml  BLOOD ADMINISTERED: none  DRAINS: none   LOCAL MEDICATIONS USED:   30 cc 1/4% marcaine  SPECIMEN:   Gall bladder  COUNTS CORRECT:  YES  INDICATIONS FOR PROCEDURE:  Bobby [name] is a 75 y.o. (DOB: 15-Dec-1940) Bobby [name]  male whose primary care physician is Mathews Argyle, MD and comes for cholecystectomy, cholangiogram, placement of wire in CBD, gastrotomy   The indications and risks of the gall bladder surgery were explained to the patient.  The risks include, but are not limited to, infection, bleeding, common bile duct injury and open surgery.  SURGERY:  The patient was taken to room #4 at [facility].  The abdomen was prepped with chloroprep.  The patient was given 2 gm Ancef at the beginning of the operation.   A time out was held and the surgical checklist run.   An infraumbilical incision was made into the abdominal cavity.  A 12 mm Hasson trocar was inserted into the abdominal cavity through the infraumbilical incision and secured with a 0 Vicryl suture.  Three additional trocars were inserted: a 10 mm trocar in the sub-xiphoid location, a 5 mm trocar in the right mid subcostal area, and a 5 mm trocar in the right lateral subcostal area.   The abdomen was explored and the liver,  stomach, and bowel that could be seen were unremarkable.   The gall bladder was mildy edematous.   I grasped the gall bladder and rotated it cephalad.  Disssection was carried down to the gall bladder/cystic duct junction and the cystic duct isolated.  A clip was placed on the gall bladder side of the cystic duct.   An intra-operative cholangiogram was shot.   The intra-operative cholangiogram was shot using a cut off Taut catheter placed through a 14 gauge angiocath in the RUQ.  The Taut catheter was inserted in the cut cystic duct and secured with an endoclip.  A cholangiogram was shot with 10 cc of 1/2 strength Omnipaque.  Using fluoroscopy, the cholangiogram showed the flow of contrast into the common bile duct, up the hepatic radicals, and into the duodenum.  He had multiple filling defects in the common bile duct.   The Taut catheter was removed.  I placed a slippery wire through the cystic duct, through the CBD, and into the duodenum.  The wire was imaged with fluoro.  This was to aid Dr. Benson Norway with the common bile duct anatomy. The cystic duct was tripley endoclipped and the cystic artery was identified and clipped.  The gall bladder was bluntly and sharpley dissected from the gall bladder bed.   After the gall bladder was removed from the liver, the gall bladder bed and [place] were inspected.  There was no bleeding or bile leak.  The gall bladder was placed in a endocatch bag and delivered through the umbilicus.  I was then ready to perform the gastrostomy so Dr. Benson Norway could do the ERCP.  I placed two #1 Novafil sutures as holding sutures through the anterior stomach wall and pulled out the abdominal wall.  Dr. Hassell Done placed a 15 mm trocar between these holding sutures.  I then made an enterotomy into the anterior stomach and passed the 15 mm trocar in to the stomach without difficulty.   Dr. Benson Norway did an ERCP through the 15 mm trocar and will dictate this portion of the procedure.   He was successful in doing a papillotomy and retrieving several CBD stones.   When Dr. Benson Norway was finished, I then rescrubbed.  I first cleaned up around the gall bladder bed.  I removed the slippery wire.  I removed some clips from the cystic duct and placed a PDS endoloop around the cystic duct and put a clip on the cystic duct.   I then removed the 15 mm trocar from the stomach.  I closed the enterotomy with five interrupted 2-0 vicryl sutures.  The enterotomy was approximately 2.2 cm in size.  After the enterotomy was closed, I put Tissell on the closure.   I closed the 15 mm trocar site with a 2-0 Vicryl.   The abdomen was irrigated with 1,500 cc saline.   The trocars were then removed.  I infiltrated 30cc of 1/4% Marcaine into the incisions.  The umbilical port closed with a 0 Vicryl suture and the skin closed with 4-0 Monocryl.  The skin was painted with LiquidBand.  The patient's sponge and needle count were correct.  The patient was transported to the RR in good condition.  Alphonsa Overall, MD, [facility] Surgery Pager: [phone] Office phone:  [phone]

## 2015-05-03 NOTE — Progress Notes (Signed)
Interesting case with access issues.  The patient's wife was in the room with him.  I have reviewed the chart, examined the patient, and discussed options thoroughly with Drs. Dalbert Batman and [place] and the patient.  Dr. Dalbert Batman has outlined the options in his notes.  One option not mentioned would be for the patient to have and esophageal dilatation (as a first step) --> ERCP  --> Cholecystectomy.  In speaking with everyone, the most practical approach seems to be to help GI with ERCP access by a laparoscopic gastrotomy.  Then cholecystectomy.  The patient understands that this might not work and he would need an laparoscopic/open common bile duct exploration.  I discussed with the patient the indications and risks of gall bladder/common duct/stomach surgery.  The primary risks of gall bladder surgery include, but are not limited to, bleeding, infection, common bile duct injury, and open surgery. We discussed the typical post-operative recovery course. I tried to answer the patient's questions.   The patient is tentatively scheduled for today at 2 PM.  Alphonsa Overall, MD, [facility] Surgery Pager: [phone] Office phone:  [phone]

## 2015-05-03 NOTE — Transfer of Care (Signed)
Immediate Anesthesia Transfer of Care Note  Patient: Bobby [name]  Procedure(s) Performed: Procedure(s): LAPAROSCOPIC CHOLECYSTECTOMY WITH INTRAOPERATIVE CHOLANGIOGRAM , LAPAROSCOPIC GASTROTOMY   (N/A) ENDOSCOPIC RETROGRADE CHOLANGIOPANCREATOGRAPHY (ERCP) (N/A)  Patient Location: PACU  Anesthesia Type:General  Level of Consciousness: awake, alert  and oriented  Airway & Oxygen Therapy: Patient Spontanous Breathing and Patient connected to face mask oxygen  Post-op Assessment: Report given to RN and Post -op Vital signs reviewed and stable  Post vital signs: Reviewed and stable  Last Vitals:  Filed Vitals:   05/02/15 2301 05/03/15 0453  BP: 149/74 151/77  Pulse: 51 48  Temp: 37.6 C 37.2 C  Resp: 18 18    Last Pain:  Filed Vitals:   05/03/15 0806  PainSc: 1       Patients Stated Pain Goal: 1 [id number]  Complications: No apparent anesthesia complications

## 2015-05-03 NOTE — Care Management Important Message (Signed)
Important Message  Patient Details  Name: Gralyn Magiera MRN: [id number] Date of Birth: 25-Jun-1940   Medicare Important Message Given:  Yes    Camillo Flaming 05/03/2015, 10:10 AMImportant Message  Patient Details  Name: Darvel Dancel MRN: [id number] Date of Birth: May 14, 1940   Medicare Important Message Given:  Yes    Camillo Flaming 05/03/2015, 10:09 AM

## 2015-05-03 NOTE — Consult Note (Signed)
I have reviewed the case in detail and discussed the situation with Drs. Zara Chess, and Outlaw in detail.  I also had a lengthy discussion with the patient about the pros and cons of the upcoming surgery/ERCP.  Dr. Paulita Fujita does not feel comfortable with dilating the distal esophagus and the images reveal that there is a high grade stenosis.  He was successfully dilated in the past by Dr. Wynetta Emery up to 16.5 mm during that one dilation session.  As a result of the current stenosis the options are limited as to how to access the CBD.  At this time point, a reasonable option, in order to avoid an open cholecystectomy, is to perform an ERCP through a gastrostomy.  This port will allow access to the ampulla.  The scans also reveal a periampullary diverticulum and it appears to be large.  The plan at this time is for Dr. Lucia Gaskins to place a guidewire through the cystic duct into the CBD and out into the duodenal lumen.  Hopefully with free hand cannulation I can access the CBD aided by the guidewire keeping the CBD semi-patent.  If this fails, then I will grab the guidewire and back load it into the ERCP scope.  Most likely I will only be able to create a small sphincterotomy with the diverticulum and then I will dilate the ampulla to allow for stone extraction.  At this juncture, I do not feel that the patient can be treated as an outpatient as he will have ascending cholangitis and/or severe pain from the CBD stones.  Another episode of gallstone pancreatitis could also occur.  The patient has a clear understanding of the approach, options, and risks, i.e., bleeding, infection, perforation, and pancreatitis, with the ERCP.  He also understands that there is no guarantee that the procedure will be successful and that this will only be my third procedure of this type.

## 2015-05-03 NOTE — Progress Notes (Signed)
1 Day Post-Op  Subjective: Stable and alert Remains afebrile, no tachycardia or signs of  toxicity on IV Zosyn. Reports mild epigastric discomfort when he drinks, so has backed off.  Lab work this morning shows slight elevation of lipase 267.  Alkaline phosphatase more elevated at 561.  Total bilirubin slightly elevated at 2.2.  WBC 11,500.  Hemoglobin 11.0.  Creatinine 1.6.  Objective: Vital signs in last 24 hours: Temp:  [98.5 F (36.9 C)-99.6 F (37.6 C)] 98.9 F (37.2 C) (05/02 0453) Pulse Rate:  [48-72] 48 (05/02 0453) Resp:  [8-19] 18 (05/02 0453) BP: (147-185)/(64-77) 151/77 mmHg (05/02 0453) SpO2:  [94 %-100 %] 96 % (05/02 0453) Last BM Date: 05/29/15  Intake/Output from previous day: 05/01 0701 - 05/02 0700 In: 1550 [P.O.:600; I.V.:400; IV Piggyback:550] Out: 0  Intake/Output this shift:    General appearance: Very pleasant.  Alert and lucid.  Mental status normal.  Does not appear toxic or L. Resp: clear to auscultation bilaterally GI: Soft.  Mild subjective tenderness in epigastrium to deep palpation.  No mass.  Right upper quadrant nontender.  Lab Results:   Recent Labs  05/02/15 0422 05/03/15 0514  WBC 12.0* 11.5*  HGB 10.8* 11.0*  HCT 31.6* 32.1*  PLT 220 243   BMET  Recent Labs  05/02/15 0422 05/03/15 0514  NA 144 143  K 3.8 3.8  CL 110 109  CO2 22 22  GLUCOSE 69 87  BUN 20 16  CREATININE 0.99 1.06  CALCIUM 8.8* 8.9   PT/INR  Recent Labs  05/02/15 0422  LABPROT 14.3  INR 1.09   ABG No results for input(s): PHART, HCO3 in the last 72 hours.  Invalid input(s): PCO2, PO2  Studies/Results: No results found.  Anti-infectives: Anti-infectives    Start     Dose/Rate Route Frequency Ordered Stop   05/02/15 1100  ciprofloxacin (CIPRO) IVPB 400 mg     400 mg 200 mL/hr over 60 Minutes Intravenous Every 12 hours 05/02/15 1057     04/30/15 0600  piperacillin-tazobactam (ZOSYN) IVPB 3.375 g  Status:  Discontinued     3.375 g 100 mL/hr  over 30 Minutes Intravenous Every 8 hours 04/30/15 0440 04/30/15 0454   04/30/15 0600  piperacillin-tazobactam (ZOSYN) IVPB 3.375 g     3.375 g 12.5 mL/hr over 240 Minutes Intravenous Every 8 hours 04/30/15 0454     04/29/15 2200  piperacillin-tazobactam (ZOSYN) IVPB 3.375 g     3.375 g 100 mL/hr over 30 Minutes Intravenous  Once 04/29/15 2158 04/30/15 0003      Assessment/Plan: s/p Procedure(s): ESOPHAGOGASTRODUODENOSCOPY (EGD) WITH PROPOFOL  Gallstone pancreatitis.  There is a small component of persistent pancreatic irritation, although the pancreas is normal in CT.  Choledocholithiasis/cholelithiasis-Dr. 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  Options at this point in time are the following: 1) transfer to tertiary care center. Dr. Paulita Fujita knows a gastroenterologist at [facility] that might reattempt the ERCP with wire exchange technique which might allow subsequent laparoscopic cholecystectomy which would be much less morbid than an open operation. I told the patient this was a reasonable option but it was also possible that they might not be able to do this and he might have to have an open common duct exploration in [facility] 2) proceed to the operating room here for laparoscopic cholecystectomy, trans-cystic duct attempt a clearing his common duct, open common bile duct  expiration through a right subcostal incision with T-tube drainage if required 3) innovative approach..... Laparoscopic cholecystectomy could be performed then gastrotomy and passage of ERCP scope through the abdominal wall by Dr. Benson Norway allowing the gastroenterologist to perform ERCP sphincterotomy and stone removal. This has been successful in limited instances. Discussed with Dr. Lucia Gaskins. Discussed with Dr. Paulita Fujita. discussed with Dr. Benson Norway.    Obviously, a less invasive procedure would be much  less morbid.  Both the patient and I are in favor of this.  I have asked Dr. Lucia Gaskins to discuss coordination of this case with Dr. Benson Norway, to see if they can get their schedules together.   I have asked Dr. Paulita Fujita to proceed with discussion with his colleague in [place]   Because of the patient's mild symptoms and persistent lab abnormalities, do not take it would be safe to discharge him home and schedule this electively.  Probably surgical intervention should be conducted during the same hospitalization.  The patient agrees.    LOS: 3 days    Bobby [name] 05/03/2015

## 2015-05-03 NOTE — Procedures (Signed)
Brief Procedure Note.  After the cholecystectomy and the creation of a gastrostomy by Dr. Lucia Gaskins, I inserted the ERCP scope and advanced it to the second portion of the duodenum.  A large ampulla was identified with a guidewire extending out from the ampulla and a large periampullary diverticulum.  After several attempts the CBD was successfully cannulated with the sphincterotome.  Back loading of the guidewire was not required.  The guidewire was secured in the cystic duct as it was not able to be passed beyond this region.  Contrast injection revealed multiple stones in the mid to distal CBD.  Contrast did extend up into the liver and there was no evidence of any proximal stones.  A 1 cm sphincterotomy was created and excellent flow of bile/contrast was achieved.  Minor bleeding occurred, but this oozing arrested spontaneously.  The CBD was swept 6-7 times and a total of four stones were extracted.  A final occlusion cholangiogram was negative for any retained stones and there was no further bleeding from the sphincterotomy.

## 2015-05-04 ENCOUNTER — Encounter (HOSPITAL_COMMUNITY): Payer: Self-pay | Admitting: Surgery

## 2015-05-04 DIAGNOSIS — K8020 Calculus of gallbladder without cholecystitis without obstruction: Secondary | ICD-10-CM

## 2015-05-04 DIAGNOSIS — K859 Acute pancreatitis without necrosis or infection, unspecified: Secondary | ICD-10-CM

## 2015-05-04 DIAGNOSIS — D649 Anemia, unspecified: Secondary | ICD-10-CM | POA: Insufficient documentation

## 2015-05-04 LAB — COMPREHENSIVE METABOLIC PANEL
ALK PHOS: 441 U/L — AB (ref 38–126)
ALT: 85 U/L — AB (ref 17–63)
AST: 42 U/L — ABNORMAL HIGH (ref 15–41)
Albumin: 3.1 g/dL — ABNORMAL LOW (ref 3.5–5.0)
Anion gap: 10 (ref 5–15)
BILIRUBIN TOTAL: 1.4 mg/dL — AB (ref 0.3–1.2)
BUN: 14 mg/dL (ref 6–20)
CALCIUM: 8.4 mg/dL — AB (ref 8.9–10.3)
CO2: 23 mmol/L (ref 22–32)
CREATININE: 1.13 mg/dL (ref 0.61–1.24)
Chloride: 106 mmol/L (ref 101–111)
GFR calc non Af Amer: >60 mL/min (ref >60)
Glucose, Bld: 202 mg/dL — ABNORMAL HIGH (ref 65–99)
Potassium: 4 mmol/L (ref 3.5–5.1)
SODIUM: 139 mmol/L (ref 135–145)
TOTAL PROTEIN: 5.8 g/dL — AB (ref 6.5–8.1)

## 2015-05-04 LAB — CBC
HEMATOCRIT: 31.2 % — AB (ref 39.0–52.0)
HEMOGLOBIN: 10.9 g/dL — AB (ref 13.0–17.0)
MCH: 31.3 pg (ref 26.0–34.0)
MCHC: 34.9 g/dL (ref 30.0–36.0)
MCV: 89.7 fL (ref 78.0–100.0)
Platelets: 271 10*3/uL (ref 150–400)
RBC: 3.48 MIL/uL — AB (ref 4.22–5.81)
RDW: 12.5 % (ref 11.5–15.5)
WBC: 20.9 10*3/uL — ABNORMAL HIGH (ref 4.0–10.5)

## 2015-05-04 LAB — LIPASE, BLOOD: Lipase: 18 U/L (ref 11–51)

## 2015-05-04 NOTE — Progress Notes (Signed)
Gen. Surgery:  Patient doing well this morning.  Not much pain.  No nausea Abdomen fairly soft.  Wounds clean. Lipase 18.  Bilirubin down to 1.4.  W BC 20,000, but expect this is reactive  Discussed with Dr. Alphonsa Overall Start clear liquids Ambulate   Edsel Petrin. Dalbert Batman, M.D., [facility] General and Minimally invasive Surgery Breast and Colorectal Surgery Office:   [phone] Pager:   [phone]

## 2015-05-04 NOTE — Progress Notes (Signed)
PROGRESS NOTE    Bobby [name]  [id number] DOB: [birth year] DOA: 04/29/2015 PCP: Mathews Argyle, MD  Outpatient Specialists:  Brief Narrative: Bobby [name] is an 75 y.o. male past medical history of esophageal strictures and orthostatic hypotension who presents to the ED was 3 weeks of poor appetite and significant epigastric pain that does not improve with eating, his lipase was also thousand with elevation in his alkaline phosphatase and AST and ALT with a bilirubin of 5 CT scan of the abdomen and pelvis showed several stones in the common bile duct with mild dilation.  Assessment & Plan   Gallstone pancreatitis/choledocholithiais -CT abdomen shows dilatation of the common bile duct to 1.3 cm with 3 stones seen in the common bile duct, concerning for obstruction.  Multiple large stone seen in the gallbladder, mild intrahepatic biliary ductal dilatation -Upon admission : Lipase 983, AST 100, ALT 191 -Gastroenterology consulted and appreciated  -Continue IV Zosyn -EGD on 05/02/2015: multiple diverticula in lower 1/3 of the esophagus, luminal stenosis GE junction. Unable to pass diagnostic endoscope into the stomach -General surgery consulted and appreciated -s/p laparoscopic cholecystectomy with intraoperative cholangiogram, placement of wire in CBD, laparoscopic gastrotomy for ERCP  Leukocytosis -Secondary to the above -continue to monitor CBC -Placed on cipro and zosyn  Orthostatic Hypotension -Continue Florinef  Asymptomatic bacteriuria  -Patient denies any urinary symptoms or dysuria  -Patient currently on IV Zosyn   Hypokalemia -Replaced, resolved -Continue to monitor BMP  Normocytic anemia -Drop in hemoglobin likely dilutional as patient was receiving IVF -hemoglobin 10.9 -Continue to monitor CBC  DVT Prophylaxis  SCDs  Code Status: DNR  Family Communication: Wife at bedside  Disposition Plan: Admitted.   Consultants Gastroenterology General  surgery  Procedures  EGD laparoscopic cholecystectomy with intraoperative cholangiogram, placement of wire in CBD, laparoscopic gastrotomy for ERCP  Antibiotics   Anti-infectives    Start     Dose/Rate Route Frequency Ordered Stop   05/02/15 1100  ciprofloxacin (CIPRO) IVPB 400 mg     400 mg 200 mL/hr over 60 Minutes Intravenous Every 12 hours 05/02/15 1057     04/30/15 0600  piperacillin-tazobactam (ZOSYN) IVPB 3.375 g  Status:  Discontinued     3.375 g 100 mL/hr over 30 Minutes Intravenous Every 8 hours 04/30/15 0440 04/30/15 0454   04/30/15 0600  piperacillin-tazobactam (ZOSYN) IVPB 3.375 g     3.375 g 12.5 mL/hr over 240 Minutes Intravenous Every 8 hours 04/30/15 0454     04/29/15 2200  piperacillin-tazobactam (ZOSYN) IVPB 3.375 g     3.375 g 100 mL/hr over 30 Minutes Intravenous  Once 04/29/15 2158 04/30/15 0003      Subjective:   Bobby [name] seen and examined today.  Patient endorses some abdominal soreness.  Denies chest pain, shortness of breath, nausea or vomiting.  Objective:   Filed Vitals:   05/03/15 1815 05/03/15 1831 05/03/15 2038 05/04/15 0506  BP: 164/78 170/77 176/84 156/85  Pulse: 66 67 68 62  Temp: 98.4 F (36.9 C) 98.1 F (36.7 C) 98.9 F (37.2 C) 99.7 F (37.6 C)  TempSrc:   Oral Oral  Resp: 13 16 18 18   Height:      Weight:      SpO2: 100% 97% 97% 94%    Intake/Output Summary (Last 24 hours) at 05/04/15 1137 Last data filed at 05/04/15 0700  Gross per 24 hour  Intake 4554.17 ml  Output     25 ml  Net 4529.17 ml   Filed Weights   04/30/15  0447  Weight: 95.709 kg (211 lb)    Exam  General: Well developed, well nourished, NAD  HEENT: NCAT,  mucous membranes moist.   Cardiovascular: S1 S2 auscultated, RRR, no murmurs  Respiratory: Clear to auscultation bilaterally   Abdomen: Soft, mild epigastric TTP, nondistended, + bowel sounds.  Incisions clean, no drainage  Extremities: warm dry without cyanosis clubbing or edema  Neuro:  AAOx3, nonfocal  Psych: Normal affect and demeanor, pleasant   Data Reviewed: I have personally reviewed following labs and imaging studies  CBC:  Recent Labs Lab 04/29/15 1834 04/30/15 0504 05/02/15 0422 05/03/15 0514 05/04/15 0437  WBC 15.1* 13.6* 12.0* 11.5* 20.9*  HGB 12.6* 11.2* 10.8* 11.0* 10.9*  HCT 37.0* 33.2* 31.6* 32.1* 31.2*  MCV 91.4 91.0 90.5 89.9 89.7  PLT 190 200 220 243 [id number]   Basic Metabolic Panel:  Recent Labs Lab 04/30/15 0504 05/01/15 1313 05/02/15 0422 05/03/15 0514 05/04/15 0530  NA 141 140 144 143 139  K 3.3* 3.7 3.8 3.8 4.0  CL 107 107 110 109 106  CO2 24 20* 22 22 23   GLUCOSE 100* 71 69 87 202*  BUN 21* 20 20 16 14   CREATININE 1.21 0.95 0.99 1.06 1.13  CALCIUM 8.5* 8.5* 8.8* 8.9 8.4*  MG 2.0  --   --   --   --   PHOS 3.4  --   --   --   --    GFR: Estimated Creatinine Clearance: 75.3 mL/min (by C-G formula based on Cr of 1.13). Liver Function Tests:  Recent Labs Lab 04/29/15 1834 04/30/15 0504 05/02/15 0422 05/03/15 0514 05/04/15 0530  AST 100* 59* 20 60* 42*  ALT 191* 146* 73* 90* 85*  ALKPHOS 396* 325* 221* 561* 441*  BILITOT 5.0* 2.6* 1.8* 2.2* 1.4*  PROT 7.0 6.2* 6.0* 5.9* 5.8*  ALBUMIN 4.0 3.5 3.1* 3.1* 3.1*    Recent Labs Lab 04/29/15 1834 05/02/15 0422 05/03/15 0514 05/04/15 0437  LIPASE 983* 33 67* 18   No results for input(s): AMMONIA in the last 168 hours. Coagulation Profile:  Recent Labs Lab 05/02/15 0422  INR 1.09   Cardiac Enzymes: No results for input(s): CKTOTAL, CKMB, CKMBINDEX, TROPONINI in the last 168 hours. BNP (last 3 results) No results for input(s): PROBNP in the last 8760 hours. HbA1C: No results for input(s): HGBA1C in the last 72 hours. CBG: No results for input(s): GLUCAP in the last 168 hours. Lipid Profile: No results for input(s): CHOL, HDL, LDLCALC, TRIG, CHOLHDL, LDLDIRECT in the last 72 hours. Thyroid Function Tests: No results for input(s): TSH, T4TOTAL, FREET4, T3FREE,  THYROIDAB in the last 72 hours. Anemia Panel: No results for input(s): VITAMINB12, FOLATE, FERRITIN, TIBC, IRON, RETICCTPCT in the last 72 hours. Urine analysis:    Component Value Date/Time   COLORURINE ORANGE* 04/29/2015 2236   APPEARANCEUR TURBID* 04/29/2015 2236   LABSPEC 1.034* 04/29/2015 2236   PHURINE 5.5 04/29/2015 2236   GLUCOSEU NEGATIVE 04/29/2015 2236   HGBUR NEGATIVE 04/29/2015 2236   BILIRUBINUR LARGE* 04/29/2015 2236   KETONESUR 15* 04/29/2015 2236   PROTEINUR 30* 04/29/2015 2236   NITRITE POSITIVE* 04/29/2015 2236   LEUKOCYTESUR SMALL* 04/29/2015 2236   Sepsis Labs: @LABRCNTIP (procalcitonin:4,lacticidven:4)  ) Recent Results (from the past 240 hour(s))  Surgical pcr screen     Status: None   Collection Time: 05/03/15 12:35 PM  Result Value Ref Range Status   MRSA, PCR NEGATIVE NEGATIVE Final   Staphylococcus aureus NEGATIVE NEGATIVE Final    Comment:  The Xpert SA Assay (FDA approved for NASAL specimens in patients over 10 years of age), is one component of a comprehensive surveillance program.  Test performance has been validated by [facility] for patients greater than or equal to 65 year old. It is not intended to diagnose infection nor to guide or monitor treatment.       Radiology Studies: Dg Cholangiogram Operative  05/03/2015  CLINICAL DATA:  Laparoscopic cholecystectomy EXAM: INTRAOPERATIVE CHOLANGIOGRAM FLUOROSCOPY TIME:  20 seconds COMPARISON:  ERCP-earlier same day ; abdominal ultrasound - 04/30/2015; CT abdomen pelvis - 04/29/2015 FINDINGS: Intraoperative cholangiographic images of the right upper abdominal quadrant during laparoscopic cholecystectomy are provided for review. Surgical clips overlie the expected location of the gallbladder fossa. Contrast injection demonstrates selective cannulation of the central aspect of the cystic duct. There is passage of contrast through the central aspect of the cystic duct with filling of a mildly  dilated common bile duct. There is passage of contrast though the CBD and into the descending portion of the duodenum. There is minimal reflux of injected contrast into the common hepatic duct and central aspect of the non dilated intrahepatic biliary system. There are suspected 3 persistent nonocclusive filling defects in the distal aspect of the CBD worrisome for choledocholithiasis. There is minimal opacification of the central aspect of the pancreatic duct which may be mildly dilated. IMPRESSION: At least 3 persistent nonocclusive filling defects within the distal aspect of the CBD worrisome for choledocholithiasis. Further evaluation and treatment with ERCP could be performed as indicated. Electronically Signed   By: Sandi Mariscal M.D.   On: 05/03/2015 18:10   Dg Ercp Biliary & Pancreatic Ducts  05/03/2015  Carol Ada, MD     05/03/2015  4:35 PM Brief Procedure Note. After the cholecystectomy and the creation of a gastrostomy by Dr. Lucia Gaskins, I inserted the ERCP scope and advanced it to the second portion of the duodenum.  A large ampulla was identified with a guidewire extending out from the ampulla and a large periampullary diverticulum.  After several attempts the CBD was successfully cannulated with the sphincterotome.  Back loading of the guidewire was not required.  The guidewire was secured in the cystic duct as it was not able to be passed beyond this region.  Contrast injection revealed multiple stones in the mid to distal CBD.  Contrast did extend up into the liver and there was no evidence of any proximal stones.  A 1 cm sphincterotomy was created and excellent flow of bile/contrast was achieved.  Minor bleeding occurred, but this oozing arrested spontaneously.  The CBD was swept 6-7 times and a total of four stones were extracted.  A final occlusion cholangiogram was negative for any retained stones and there was no further bleeding from the sphincterotomy.    Scheduled Meds: . antiseptic oral  rinse  7 mL Mouth Rinse q12n4p  . chlorhexidine  15 mL Mouth Rinse BID  . ciprofloxacin  400 mg Intravenous Q12H  . fludrocortisone  0.1 mg Oral q morning - 10a  . heparin subcutaneous  5,000 Units Subcutaneous Q8H  . latanoprost  1 drop Both Eyes QHS  . piperacillin-tazobactam (ZOSYN)  IV  3.375 g Intravenous Q8H   Continuous Infusions: . dextrose 5 % and 0.45 % NaCl with KCl 20 mEq/L 125 mL/hr at 05/04/15 0908     LOS: 4 days   Time Spent in minutes   30 minutes  Staci Dack D.O. on 05/04/2015 at 11:37 AM  Between 7am to  7pm - Pager - [phone]  After 7pm go to [email or web address] - password [email or web address]  And look for the night coverage person covering for me after hours  [facility] Office  [phone]

## 2015-05-04 NOTE — Op Note (Signed)
[facility] Patient Name: Bobby [name] Procedure Date: 05/03/2015 MRN: [id number] Attending MD: Carol Ada , MD Date of Birth: 08-28-1940 CSN:  Age: 75 Admit Type: Outpatient Procedure:                ERCP Indications:              Bile duct stone(s) Providers:                Carol Ada, MD, Sarah Monday, RN, Elspeth Cho,                            Technician Referring MD:              Medicines:                General Anesthesia Complications:            No immediate complications. Estimated Blood Loss:     Estimated blood loss was minimal. Procedure:                Pre-Anesthesia Assessment:                           - Prior to the procedure, a History and Physical                            was performed, and patient medications and                            allergies were reviewed. The patient's tolerance of                            previous anesthesia was also reviewed. The risks                            and benefits of the procedure and the sedation                            options and risks were discussed with the patient.                            All questions were answered, and informed consent                            was obtained. Prior Anticoagulants: The patient has                            taken no previous anticoagulant or antiplatelet                            agents. ASA Grade Assessment: II - A patient with                            mild systemic disease. After reviewing the risks                            and benefits,  the patient was deemed in                            satisfactory condition to undergo the procedure.                           After obtaining informed consent, the scope was                            passed under direct vision. Throughout the                            procedure, the patient's blood pressure, pulse, and                            oxygen saturations were monitored continuously. The                             [id number]) scope was introduced through                            the mouth, and used to inject contrast into and                            used to inject contrast into the bile duct. The                            ERCP was accomplished without difficulty. The                            patient tolerated the procedure well. Scope In: Scope Out: Findings:      A 10 mm biliary sphincterotomy was made with a monofilament traction       (standard) sphincterotome using ERBE electrocautery. There was self       limited oozing from the sphincterotomy which did not require treatment.       The lower third of the main bile duct and middle third of the main bile       duct contained multiple stones, the largest of which was 10 mm in       diameter. The biliary tree was swept with a 15 mm balloon starting at       the cystic duct. Four stones were removed. No stones remained.      After the cholecystectomy and the creation of a gastrostomy by Dr.       Lucia Gaskins, I was able to to introduce the ERCP scope into the gastric lumen       and then into the second portion of the duodenum. The ampulla was easily       identified as it was large with a large periampullary diverticulum. The       guidewire placed by Dr. Lucia Gaskins was extending out from the ampulla into       the distal portion of the duodenum. This guidewire facilitated access       into the CBD with the sphinctertome in a free hand technique. No       backloading of the  surgical guidewire was necessary. After several       attempts the CBD was cannulated and the guidewire was advanced in to the       right intrahepatic ducts, however, the sphinctertome guidewire slipped       out of position. Unfortunately it was not able to be resecured in the       right intrahepatic ducts despite multiple attemtps with recannulation of       the hepatic ducts. The guidewire was secured in the cystic duct.       Contrast injection  did opacify the distal intrahepatic portion of the       right intrahepatic ducts and the CBD. In the mid to distal CBD several       filling defects were identified, which was consistent with the IOC and       the recent CT scan. The CBD was dilated to approximately 1 cm. A 1 cm       sphincterotomy was created and initial rapid oozing of blood was       encountered. Epi was readied, but not required as the bleeding       spontaneously arrested after a couple of minutes. The CBD was swept 6-7       times and a total of four stones were extracted. The removal of the       stones did not precipitate any further bleeding. The final occlusion       cholangiogram was negative for any retained stones. At this point the       guidewire was removed and the scope withdrawn. Impression:               - Choledocholithiasis was found. Complete removal                            was accomplished by biliary sphincterotomy and                            balloon extraction.                           - A biliary sphincterotomy was performed.                           - The biliary tree was swept. Moderate Sedation:      N/A- Per Anesthesia Care Recommendation:           - Return patient to hospital ward for ongoing care. Procedure Code(s):        --- Professional ---                           [phone], Endoscopic retrograde                            cholangiopancreatography (ERCP); with removal of                            calculi/debris from biliary/pancreatic duct(s) Diagnosis Code(s):        --- Professional ---                           K80.50,  Calculus of bile duct without cholangitis                            or cholecystitis without obstruction CPT copyright 2016 [employer]. All rights reserved. The codes documented in this report are preliminary and upon coder review may  be revised to meet current compliance requirements. Carol Ada, MD Carol Ada, MD 05/04/2015 7:16:29  AM This report has been signed electronically. Number of Addenda: 0

## 2015-05-05 LAB — COMPREHENSIVE METABOLIC PANEL
ALBUMIN: 2.9 g/dL — AB (ref 3.5–5.0)
ALK PHOS: 348 U/L — AB (ref 38–126)
ALT: 81 U/L — AB (ref 17–63)
ANION GAP: 9 (ref 5–15)
AST: 37 U/L (ref 15–41)
BILIRUBIN TOTAL: 1.1 mg/dL (ref 0.3–1.2)
BUN: 8 mg/dL (ref 6–20)
CO2: 26 mmol/L (ref 22–32)
CREATININE: 1.06 mg/dL (ref 0.61–1.24)
Calcium: 8.3 mg/dL — ABNORMAL LOW (ref 8.9–10.3)
Chloride: 108 mmol/L (ref 101–111)
GFR calc non Af Amer: >60 mL/min (ref >60)
GLUCOSE: 121 mg/dL — AB (ref 65–99)
Potassium: 3.6 mmol/L (ref 3.5–5.1)
Sodium: 143 mmol/L (ref 135–145)
TOTAL PROTEIN: 5.6 g/dL — AB (ref 6.5–8.1)

## 2015-05-05 LAB — CBC
HEMATOCRIT: 30.4 % — AB (ref 39.0–52.0)
HEMOGLOBIN: 10.3 g/dL — AB (ref 13.0–17.0)
MCH: 30.9 pg (ref 26.0–34.0)
MCHC: 33.9 g/dL (ref 30.0–36.0)
MCV: 91.3 fL (ref 78.0–100.0)
Platelets: 235 10*3/uL (ref 150–400)
RBC: 3.33 MIL/uL — AB (ref 4.22–5.81)
RDW: 12.8 % (ref 11.5–15.5)
WBC: 14 10*3/uL — ABNORMAL HIGH (ref 4.0–10.5)

## 2015-05-05 MED ORDER — AMOXICILLIN-POT CLAVULANATE 875-125 MG PO TABS: 1.0000 | ORAL_TABLET | Freq: Two times a day (BID) | ORAL | Status: DC

## 2015-05-05 MED ORDER — SENNA 8.6 MG PO TABS
1.0000 | ORAL_TABLET | Freq: Two times a day (BID) | ORAL | Status: DC
  Administered 2015-05-05: 8.6 mg via ORAL
  Filled 2015-05-05: qty 1

## 2015-05-05 MED ORDER — HYDROCODONE-ACETAMINOPHEN 5-325 MG PO TABS: 1.0000 | ORAL_TABLET | ORAL | Status: DC | PRN

## 2015-05-05 NOTE — Progress Notes (Signed)
2 Days Post-Op  Subjective: Doing okay with clear liquids.  No nausea or vomiting.  Voiding okay.  Ambulating.  Mild incisional pain. Afebrile.  Stable. WBC down, 14,000.  Hemoglobin stable at 10.3.  Total bilirubin 1.1.  ALT 81.  Alkaline phosphatase 348.  Creatinine 8.3.  Potassium 3.6.  Objective: Vital signs in last 24 hours: Temp:  [98.4 F (36.9 C)-98.9 F (37.2 C)] 98.4 F (36.9 C) (05/04 0503) Pulse Rate:  [58-60] 58 (05/04 0503) Resp:  [16] 16 (05/04 0503) BP: (149-157)/(72-76) 157/76 mmHg (05/04 0503) SpO2:  [94 %-96 %] 95 % (05/04 0503) Last BM Date: 05/29/15  Intake/Output from previous day: 05/03 0701 - 05/04 0700 In: 3611.7 [P.O.:720; I.V.:2791.7; IV Piggyback:100] Out: -  Intake/Output this shift: Total I/O In: 2111.7 [P.O.:720; I.V.:1291.7; IV Piggyback:100] Out: -   General appearance: Alert and cooperative.  Mental status normal.  No significant distress. Resp: clear to auscultation bilaterally GI: Soft.  Appropriate incisional tenderness.  Hypoactive bowel sounds.  Doesn't seem distended.  Lab Results:  Results for orders placed or performed during the hospital encounter of 04/29/15 (from the past 24 hour(s))  CBC     Status: Abnormal   Collection Time: 05/05/15  4:45 AM  Result Value Ref Range   WBC 14.0 (H) 4.0 - 10.5 K/uL   RBC 3.33 (L) 4.22 - 5.81 MIL/uL   Hemoglobin 10.3 (L) 13.0 - 17.0 g/dL   HCT 30.4 (L) 39.0 - 52.0 %   MCV 91.3 78.0 - 100.0 fL   MCH 30.9 26.0 - 34.0 pg   MCHC 33.9 30.0 - 36.0 g/dL   RDW 12.8 11.5 - 15.5 %   Platelets 235 150 - 400 K/uL  Comprehensive metabolic panel     Status: Abnormal   Collection Time: 05/05/15  4:45 AM  Result Value Ref Range   Sodium 143 135 - 145 mmol/L   Potassium 3.6 3.5 - 5.1 mmol/L   Chloride 108 101 - 111 mmol/L   CO2 26 22 - 32 mmol/L   Glucose, Bld 121 (H) 65 - 99 mg/dL   BUN 8 6 - 20 mg/dL   Creatinine, Ser 1.06 0.61 - 1.24 mg/dL   Calcium 8.3 (L) 8.9 - 10.3 mg/dL   Total Protein 5.6 (L)  6.5 - 8.1 g/dL   Albumin 2.9 (L) 3.5 - 5.0 g/dL   AST 37 15 - 41 U/L   ALT 81 (H) 17 - 63 U/L   Alkaline Phosphatase 348 (H) 38 - 126 U/L   Total Bilirubin 1.1 0.3 - 1.2 mg/dL   GFR calc non Af Amer >60 >60 mL/min   GFR calc Af Amer >60 >60 mL/min   Anion gap 9 5 - 15     Studies/Results: No results found.  Marland Kitchen antiseptic oral rinse  7 mL Mouth Rinse q12n4p  . chlorhexidine  15 mL Mouth Rinse BID  . fludrocortisone  0.1 mg Oral q morning - 10a  . heparin subcutaneous  5,000 Units Subcutaneous Q8H  . latanoprost  1 drop Both Eyes QHS  . piperacillin-tazobactam (ZOSYN)  IV  3.375 g Intravenous Q8H  . senna  1 tablet Oral BID     Assessment/Plan: s/p Procedure(s): LAPAROSCOPIC CHOLECYSTECTOMY WITH INTRAOPERATIVE CHOLANGIOGRAM , LAPAROSCOPIC GASTROTOMY   ENDOSCOPIC RETROGRADE CHOLANGIOPANCREATOGRAPHY (ERCP)   POD #2.  Laparoscopic cholecystectomy.  Intraoperative ERCP with gastrotomy and ERCP, sphincterotomy and removal of CBD stones. Seems to be doing well Increase ambulation Dysphagia 1 diet.  Advance as tolerated Still on Zosyn.  Probably can be discontinued.  We'll check with Dr. Lucia Gaskins. Home this afternoon or tomorrow.   @PROBHOSP @  LOS: 5 days    Bobby [name] 05/05/2015  . .prob

## 2015-05-05 NOTE — Discharge Instructions (Signed)

## 2015-05-05 NOTE — Discharge Summary (Signed)
Physician Discharge Summary  Bobby [name] [id number] DOB: August 13, 1940 DOA: 04/29/2015  PCP: Bobby Argyle, MD  Admit date: 04/29/2015 Discharge date: 05/05/2015  Time spent: 45 minutes  Recommendations for Outpatient Follow-up:  Patient will be discharged to home.  Patient will need to follow up with primary care provider within one week of discharge, repeat CBC and CMP in one week.  Follow up with Bobby [name], surgery, in 2 weeks. Patient should continue medications as prescribed.  Patient should follow a soft diet and advance as tolerated.    Discharge Diagnoses:  Gallstone pancreatitis/choledocholithiais Leukocytosis Orthostatic Hypotension Asymptomatic bacteriuria  Hypokalemia Normocytic anemia  Discharge Condition: Stable  Diet recommendation: Soft  Filed Weights   04/30/15 0447  Weight: 95.709 kg (211 lb)    History of present illness:  On 04/30/2015 by Bobby [name] is a 75 y.o. male with medical history significant of esophageal stricture and Orthostatic hypotension Presented with 2-3 weeks of not feeling well poor appetite. For the past few days have had significant epigastric pain not improved with eating but not worsen. He had to change positions to help the pain. Denies fever but on 4/17 had chills. No nausea or vomiting no chest pain, no shortness of breath no diarrhea no constipation.  He went to PCP and had la work done and from there he was sent to ER.   Hospital Course:  Gallstone pancreatitis/choledocholithiais -CT abdomen shows dilatation of the common bile duct to 1.3 cm with 3 stones seen in the common bile duct, concerning for obstruction. Multiple large stone seen in the gallbladder, mild intrahepatic biliary ductal dilatation -Upon admission : Lipase 983, AST 100, ALT 191 -Gastroenterology consulted and appreciated  -Was placed on IV Zosyn -EGD on 05/02/2015: multiple diverticula in lower 1/3 of the esophagus, luminal  stenosis GE junction. Unable to pass diagnostic endoscope into the stomach -General surgery consulted and appreciated -s/p laparoscopic cholecystectomy with intraoperative cholangiogram, placement of wire in CBD, laparoscopic gastrotomy for ERCP -Placed on dysphagia diet and able to tolerate -Successfully had a bowel movement -Spoke with Bobby [name], recommended follow up with Bobby [name] in 2 weeks and Augmentin -Will discharge patient with 3 days of Augmentin  Leukocytosis -Secondary to the above -Trending downward -Repeat CBC in one week  Orthostatic Hypotension -Continue Florinef  Asymptomatic bacteriuria  -Patient denies any urinary symptoms or dysuria  -Was placed on IV Zosyn   Hypokalemia -Replaced, resolved -Continue to monitor BMP  Normocytic anemia -Drop in hemoglobin likely dilutional as patient was receiving IVF -hemoglobin 10.9 -Continue to monitor CBC  Code Status: DNR  Consultants Gastroenterology General surgery  Procedures  EGD laparoscopic cholecystectomy with intraoperative cholangiogram, placement of wire in CBD, laparoscopic gastrotomy for ERCP  Discharge Exam: Filed Vitals:   05/04/15 2146 05/05/15 0503  BP: 155/73 157/76  Pulse: 60 58  Temp: 98.9 F (37.2 C) 98.4 F (36.9 C)  Resp: 16 16    Exam  General: Well developed, well nourished, NAD  HEENT: NCAT, mucous membranes moist.   Cardiovascular: S1 S2 auscultated, RRR, no murmurs  Respiratory: Clear to auscultation bilaterally  Abdomen: Soft, TTP around incision sites, nondistended, + bowel sounds. Incisions clean, no drainage  Extremities: warm dry without cyanosis clubbing or edema  Neuro: AAOx3, nonfocal  Psych: Normal affect and demeanor, pleasant  Discharge Instructions     Medication List    TAKE these medications        acetaminophen 500 MG tablet  Commonly known as:  TYLENOL  Take 1,000-1,500 mg by mouth 2 (two) times daily.     amoxicillin-clavulanate  875-125 MG tablet  Commonly known as:  AUGMENTIN  Take 1 tablet by mouth 2 (two) times daily.     cholecalciferol 1000 units tablet  Commonly known as:  VITAMIN D  Take 1,000 Units by mouth daily.     fludrocortisone 0.1 MG tablet  Commonly known as:  FLORINEF  Take 0.1 mg by mouth every morning.     HYDROcodone-acetaminophen 5-325 MG tablet  Commonly known as:  NORCO/VICODIN  Take 1-2 tablets by mouth every 4 (four) hours as needed for moderate pain.     multivitamin with minerals Tabs tablet  Take 1 tablet by mouth daily.     omeprazole 20 MG capsule  Commonly known as:  PRILOSEC  Take 20 mg by mouth daily with breakfast.     travoprost (benzalkonium) 0.004 % ophthalmic solution  Commonly known as:  TRAVATAN  Place 1 drop into both eyes at bedtime.       No Known Allergies Follow-up Information    Follow up with Bobby Argyle, MD. Schedule an appointment as soon as possible for a visit in 1 week.   Specialty:  Internal Medicine   Why:  Hospital follow up   Contact information:   [address] [place] [address] [phone]       Follow up with [facility], [facility]. Schedule an appointment as soon as possible for a visit in 1 week.   Specialty:  General Surgery   Why:  Hospital follow up   Contact information:   [place] [address] [phone]        The results of significant diagnostics from this hospitalization (including imaging, microbiology, ancillary and laboratory) are listed below for reference.    Significant Diagnostic Studies: Dg Cholangiogram Operative  05/03/2015  CLINICAL DATA:  Laparoscopic cholecystectomy EXAM: INTRAOPERATIVE CHOLANGIOGRAM FLUOROSCOPY TIME:  20 seconds COMPARISON:  ERCP-earlier same day ; abdominal ultrasound - 04/30/2015; CT abdomen pelvis - 04/29/2015 FINDINGS: Intraoperative cholangiographic images of the right upper abdominal quadrant during laparoscopic cholecystectomy are  provided for review. Surgical clips overlie the expected location of the gallbladder fossa. Contrast injection demonstrates selective cannulation of the central aspect of the cystic duct. There is passage of contrast through the central aspect of the cystic duct with filling of a mildly dilated common bile duct. There is passage of contrast though the CBD and into the descending portion of the duodenum. There is minimal reflux of injected contrast into the common hepatic duct and central aspect of the non dilated intrahepatic biliary system. There are suspected 3 persistent nonocclusive filling defects in the distal aspect of the CBD worrisome for choledocholithiasis. There is minimal opacification of the central aspect of the pancreatic duct which may be mildly dilated. IMPRESSION: At least 3 persistent nonocclusive filling defects within the distal aspect of the CBD worrisome for choledocholithiasis. Further evaluation and treatment with ERCP could be performed as indicated. Electronically Signed   By: Sandi Mariscal M.D.   On: 05/03/2015 18:10   Ct Abdomen Pelvis W Contrast  04/29/2015  CLINICAL DATA:  Subacute onset of generalized abdominal pain. Initial encounter. EXAM: CT ABDOMEN AND PELVIS WITH CONTRAST TECHNIQUE: Multidetector CT imaging of the abdomen and pelvis was performed using the standard protocol following bolus administration of intravenous contrast. CONTRAST:  120mL ISOVUE-300 IOPAMIDOL (ISOVUE-300) INJECTION 61% COMPARISON:  None. FINDINGS: The visualized lung bases are clear. There  appears to be a moderate diverticulum arising from a small hiatal hernia. There is mild intrahepatic biliary duct dilatation. Multiple large stones are seen within the gallbladder. There is dilatation of the common bile duct to 1.3 cm, with 3 stones seen in the common bile duct, concerning for obstruction. The pancreas and adrenal glands are unremarkable. The spleen is grossly unremarkable in appearance. The kidneys  are unremarkable in appearance. There is no evidence of hydronephrosis. No renal or ureteral stones are seen. Mild nonspecific perinephric stranding is noted bilaterally. No free fluid is identified. The small bowel is unremarkable in appearance. The stomach is within normal limits. No acute vascular abnormalities are seen. Minimal calcification is seen along the abdominal aorta and its branches. The appendix is normal in caliber and contains trace air, without evidence of appendicitis. Diffuse diverticulosis is noted along the descending and sigmoid colon, without evidence of diverticulitis. The bladder is mildly distended. Mild bladder wall thickening could reflect cystitis. The prostate remains normal in size, with minimal calcification. No inguinal lymphadenopathy is seen. No acute osseous abnormalities are identified. Mild vacuum phenomenon is noted at L4-L5. IMPRESSION: 1. Dilatation of the common bile duct to 1.3 cm, with 3 stones seen in the common bile duct, concerning for obstruction. ERCP would be helpful for further evaluation, as deemed clinically appropriate. 2. Multiple large stones seen within the gallbladder. Mild intrahepatic biliary ductal dilatation seen. 3. Mild bladder wall thickening could reflect cystitis. Would correlate with the patient's symptoms. 4. Apparent moderate diverticulum seen arising from a small hiatal hernia. 5. Diffuse diverticulosis along the descending and sigmoid colon, without evidence of diverticulitis. Electronically Signed   By: Garald Balding M.D.   On: 04/29/2015 23:38   Dg Ercp Biliary & Pancreatic Ducts  05/03/2015  Carol Ada, MD     05/03/2015  4:35 PM Brief Procedure Note. After the cholecystectomy and the creation of a gastrostomy by Bobby [name], I inserted the ERCP scope and advanced it to the second portion of the duodenum.  A large ampulla was identified with a guidewire extending out from the ampulla and a large periampullary diverticulum.  After several  attempts the CBD was successfully cannulated with the sphincterotome.  Back loading of the guidewire was not required.  The guidewire was secured in the cystic duct as it was not able to be passed beyond this region.  Contrast injection revealed multiple stones in the mid to distal CBD.  Contrast did extend up into the liver and there was no evidence of any proximal stones.  A 1 cm sphincterotomy was created and excellent flow of bile/contrast was achieved.  Minor bleeding occurred, but this oozing arrested spontaneously.  The CBD was swept 6-7 times and a total of four stones were extracted.  A final occlusion cholangiogram was negative for any retained stones and there was no further bleeding from the sphincterotomy.  US Abdomen Limited Ruq  04/30/2015  CLINICAL DATA:  Right upper quadrant pain for 2 days with abnormal CT EXAM: US ABDOMEN LIMITED - RIGHT UPPER QUADRANT COMPARISON:  CT from yesterday FINDINGS: Gallbladder: Cholelithiasis. No wall thickening or focal tenderness to suggest acute cholecystitis. Common bile duct: Diameter: 13 mm at maximum. Multiple calculi seen in the lower common bile duct, up to 8 mm in diameter. Intrahepatic bile duct enlargement. Liver: No focal lesion identified. Within normal limits in parenchymal echogenicity. IMPRESSION: 1. Choledocholithiasis with dilated/obstructed biliary tree. 2. Cholelithiasis without cholecystitis. Electronically Signed   By: Neva Seat.D.  On: 04/30/2015 01:31    Microbiology: Recent Results (from the past 240 hour(s))  Surgical pcr screen     Status: None   Collection Time: 05/03/15 12:35 PM  Result Value Ref Range Status   MRSA, PCR NEGATIVE NEGATIVE Final   Staphylococcus aureus NEGATIVE NEGATIVE Final    Comment:        The Xpert SA Assay (FDA approved for NASAL specimens in patients over 31 years of age), is one component of a comprehensive surveillance program.  Test performance has been validated by [facility] for  patients greater than or equal to 52 year old. It is not intended to diagnose infection nor to guide or monitor treatment.      Labs: Basic Metabolic Panel:  Recent Labs Lab 04/30/15 0504 05/01/15 1313 05/02/15 0422 05/03/15 0514 05/04/15 0530 05/05/15 0445  NA 141 140 144 143 139 143  K 3.3* 3.7 3.8 3.8 4.0 3.6  CL 107 107 110 109 106 108  CO2 24 20* 22 22 23 26   GLUCOSE 100* 71 69 87 202* 121*  BUN 21* 20 20 16 14 8   CREATININE 1.21 0.95 0.99 1.06 1.13 1.06  CALCIUM 8.5* 8.5* 8.8* 8.9 8.4* 8.3*  MG 2.0  --   --   --   --   --   PHOS 3.4  --   --   --   --   --    Liver Function Tests:  Recent Labs Lab 04/30/15 0504 05/02/15 0422 05/03/15 0514 05/04/15 0530 05/05/15 0445  AST 59* 20 60* 42* 37  ALT 146* 73* 90* 85* 81*  ALKPHOS 325* 221* 561* 441* 348*  BILITOT 2.6* 1.8* 2.2* 1.4* 1.1  PROT 6.2* 6.0* 5.9* 5.8* 5.6*  ALBUMIN 3.5 3.1* 3.1* 3.1* 2.9*    Recent Labs Lab 04/29/15 1834 05/02/15 0422 05/03/15 0514 05/04/15 0437  LIPASE 983* 33 67* 18   No results for input(s): AMMONIA in the last 168 hours. CBC:  Recent Labs Lab 04/30/15 0504 05/02/15 0422 05/03/15 0514 05/04/15 0437 05/05/15 0445  WBC 13.6* 12.0* 11.5* 20.9* 14.0*  HGB 11.2* 10.8* 11.0* 10.9* 10.3*  HCT 33.2* 31.6* 32.1* 31.2* 30.4*  MCV 91.0 90.5 89.9 89.7 91.3  PLT 200 220 243 271 235   Cardiac Enzymes: No results for input(s): CKTOTAL, CKMB, CKMBINDEX, TROPONINI in the last 168 hours. BNP: BNP (last 3 results) No results for input(s): BNP in the last 8760 hours.  ProBNP (last 3 results) No results for input(s): PROBNP in the last 8760 hours.  CBG: No results for input(s): GLUCAP in the last 168 hours.     SignedCristal Ford  [facility] 05/05/2015, 1:33 PM

## 2015-05-16 DIAGNOSIS — K222 Esophageal obstruction: Secondary | ICD-10-CM | POA: Diagnosis not present

## 2015-05-16 DIAGNOSIS — D500 Iron deficiency anemia secondary to blood loss (chronic): Secondary | ICD-10-CM | POA: Diagnosis not present

## 2015-05-16 DIAGNOSIS — K851 Biliary acute pancreatitis without necrosis or infection: Secondary | ICD-10-CM | POA: Diagnosis not present

## 2015-05-16 DIAGNOSIS — D72820 Lymphocytosis (symptomatic): Secondary | ICD-10-CM | POA: Diagnosis not present

## 2015-05-16 DIAGNOSIS — D72823 Leukemoid reaction: Secondary | ICD-10-CM | POA: Diagnosis not present

## 2015-07-18 DIAGNOSIS — D72820 Lymphocytosis (symptomatic): Secondary | ICD-10-CM | POA: Diagnosis not present

## 2015-10-12 DIAGNOSIS — H401112 Primary open-angle glaucoma, right eye, moderate stage: Secondary | ICD-10-CM | POA: Diagnosis not present

## 2015-10-12 DIAGNOSIS — H401121 Primary open-angle glaucoma, left eye, mild stage: Secondary | ICD-10-CM | POA: Diagnosis not present

## 2015-10-12 DIAGNOSIS — Z23 Encounter for immunization: Secondary | ICD-10-CM | POA: Diagnosis not present

## 2015-10-24 DIAGNOSIS — R300 Dysuria: Secondary | ICD-10-CM | POA: Diagnosis not present

## 2016-02-14 DIAGNOSIS — D72820 Lymphocytosis (symptomatic): Secondary | ICD-10-CM | POA: Diagnosis not present

## 2016-02-14 DIAGNOSIS — N183 Chronic kidney disease, stage 3 (moderate): Secondary | ICD-10-CM | POA: Diagnosis not present

## 2016-02-14 DIAGNOSIS — R1310 Dysphagia, unspecified: Secondary | ICD-10-CM | POA: Diagnosis not present

## 2016-02-14 DIAGNOSIS — I951 Orthostatic hypotension: Secondary | ICD-10-CM | POA: Diagnosis not present

## 2016-02-14 DIAGNOSIS — Z0000 Encounter for general adult medical examination without abnormal findings: Secondary | ICD-10-CM | POA: Diagnosis not present

## 2016-02-14 DIAGNOSIS — Z1389 Encounter for screening for other disorder: Secondary | ICD-10-CM | POA: Diagnosis not present

## 2016-02-14 DIAGNOSIS — Z79899 Other long term (current) drug therapy: Secondary | ICD-10-CM | POA: Diagnosis not present

## 2016-03-28 DIAGNOSIS — D72828 Other elevated white blood cell count: Secondary | ICD-10-CM | POA: Diagnosis not present

## 2016-03-28 DIAGNOSIS — I951 Orthostatic hypotension: Secondary | ICD-10-CM | POA: Diagnosis not present

## 2016-03-28 DIAGNOSIS — Z79899 Other long term (current) drug therapy: Secondary | ICD-10-CM | POA: Diagnosis not present

## 2016-04-27 DIAGNOSIS — D2272 Melanocytic nevi of left lower limb, including hip: Secondary | ICD-10-CM | POA: Diagnosis not present

## 2016-04-27 DIAGNOSIS — D225 Melanocytic nevi of trunk: Secondary | ICD-10-CM | POA: Diagnosis not present

## 2016-04-27 DIAGNOSIS — D1801 Hemangioma of skin and subcutaneous tissue: Secondary | ICD-10-CM | POA: Diagnosis not present

## 2016-04-27 DIAGNOSIS — L821 Other seborrheic keratosis: Secondary | ICD-10-CM | POA: Diagnosis not present

## 2016-04-27 DIAGNOSIS — Z85828 Personal history of other malignant neoplasm of skin: Secondary | ICD-10-CM | POA: Diagnosis not present

## 2016-06-20 DIAGNOSIS — D72820 Lymphocytosis (symptomatic): Secondary | ICD-10-CM | POA: Diagnosis not present

## 2016-06-27 DIAGNOSIS — D500 Iron deficiency anemia secondary to blood loss (chronic): Secondary | ICD-10-CM | POA: Diagnosis not present

## 2016-06-27 DIAGNOSIS — N183 Chronic kidney disease, stage 3 (moderate): Secondary | ICD-10-CM | POA: Diagnosis not present

## 2016-08-13 DIAGNOSIS — N183 Chronic kidney disease, stage 3 (moderate): Secondary | ICD-10-CM | POA: Diagnosis not present

## 2016-08-13 DIAGNOSIS — D72820 Lymphocytosis (symptomatic): Secondary | ICD-10-CM | POA: Diagnosis not present

## 2016-08-13 DIAGNOSIS — I951 Orthostatic hypotension: Secondary | ICD-10-CM | POA: Diagnosis not present

## 2016-10-04 DIAGNOSIS — Z23 Encounter for immunization: Secondary | ICD-10-CM | POA: Diagnosis not present

## 2016-10-11 DIAGNOSIS — H401112 Primary open-angle glaucoma, right eye, moderate stage: Secondary | ICD-10-CM | POA: Diagnosis not present

## 2016-10-11 DIAGNOSIS — H401121 Primary open-angle glaucoma, left eye, mild stage: Secondary | ICD-10-CM | POA: Diagnosis not present

## 2016-10-16 DIAGNOSIS — H2513 Age-related nuclear cataract, bilateral: Secondary | ICD-10-CM | POA: Diagnosis not present

## 2016-10-16 DIAGNOSIS — H401121 Primary open-angle glaucoma, left eye, mild stage: Secondary | ICD-10-CM | POA: Diagnosis not present

## 2016-10-16 DIAGNOSIS — H401112 Primary open-angle glaucoma, right eye, moderate stage: Secondary | ICD-10-CM | POA: Diagnosis not present

## 2016-10-16 DIAGNOSIS — H25013 Cortical age-related cataract, bilateral: Secondary | ICD-10-CM | POA: Diagnosis not present

## 2016-10-16 DIAGNOSIS — D72820 Lymphocytosis (symptomatic): Secondary | ICD-10-CM | POA: Diagnosis not present

## 2017-02-15 DIAGNOSIS — Z0000 Encounter for general adult medical examination without abnormal findings: Secondary | ICD-10-CM | POA: Diagnosis not present

## 2017-02-15 DIAGNOSIS — N183 Chronic kidney disease, stage 3 (moderate): Secondary | ICD-10-CM | POA: Diagnosis not present

## 2017-02-15 DIAGNOSIS — D72820 Lymphocytosis (symptomatic): Secondary | ICD-10-CM | POA: Diagnosis not present

## 2017-02-15 DIAGNOSIS — Z79899 Other long term (current) drug therapy: Secondary | ICD-10-CM | POA: Diagnosis not present

## 2017-02-15 DIAGNOSIS — Z1389 Encounter for screening for other disorder: Secondary | ICD-10-CM | POA: Diagnosis not present

## 2017-02-15 DIAGNOSIS — I951 Orthostatic hypotension: Secondary | ICD-10-CM | POA: Diagnosis not present

## 2017-02-20 ENCOUNTER — Encounter: Payer: Self-pay | Admitting: Hematology

## 2017-02-20 ENCOUNTER — Telehealth: Payer: Self-pay | Admitting: Hematology

## 2017-02-20 NOTE — Telephone Encounter (Signed)
Appt has been scheduled for the pt to see Dr. Irene Limbo on 3/12 at 1pm. Pt aware to arrive 30 minutes early. Address verified. Letter mailed.

## 2017-03-04 DIAGNOSIS — H401121 Primary open-angle glaucoma, left eye, mild stage: Secondary | ICD-10-CM | POA: Diagnosis not present

## 2017-03-04 DIAGNOSIS — H401112 Primary open-angle glaucoma, right eye, moderate stage: Secondary | ICD-10-CM | POA: Diagnosis not present

## 2017-03-07 NOTE — Progress Notes (Signed)
HEMATOLOGY/ONCOLOGY CONSULTATION NOTE  Date of Service: 03/12/2017  Patient Care Team: Lajean Manes, MD as PCP - General (Internal Medicine)  CHIEF COMPLAINTS/PURPOSE OF CONSULTATION:  Lymphocytosis   HISTORY OF PRESENTING ILLNESS:   Bobby [name] is a wonderful 77 y.o. male who has been referred to [place] by Dr. Lajean Manes, his PCP, for evaluation and management of lymphocytosis.  He presents to the clinic today accompanied by his wife.  He notes lately he has felt low energy and low stamina. This change is not sudden but still a significant drop in the past 6 months. About 1 year prior he started to feel more presence of fatigue. He denied and lumps or bumps and palpable lymph nodes. No enlarged lymph nodes in his last CT from 2017.   On review of symptoms, pt notes significant fatigue from doing heavy yardwork and walking a mile and his back now gets tired sooner. He denies SOB. He notes stable weight. He denies fever, chills, night sweats, abdominal pain, skin rash or itching or leg swelling. He denies recent viral infection.   He has been on low dose Fludrocortisone for 2-3 years for his orthostastic hypotension. He attributes this hypotension to prior alcohol use and other issues. He cut back on tylenol which he takes for osteoarthritis. No new medication change in the past 6 months.He denies iron or other vitamin deficiency. He notes he had a tight esophagus and had it stretched 5 years ago. He had gallbladder removed in 2017. Pt notes having to chew his food very well before swallowing. He was a smoker 40 years ago. He denies chemical exposures in the past. He is up to date on his cancer screenings. He has never had a blood transfusion.     MEDICAL HISTORY:  Past Medical History:  Diagnosis Date  . Anxiety   . Diverticulosis   . Orthostatic hypotension   . Tinnitus     SURGICAL HISTORY: Past Surgical History:  Procedure Laterality Date  . BALLOON DILATION N/A  02/26/2012   Procedure: BALLOON DILATION;  Surgeon: Garlan Fair, MD;  Location: [facility];  Service: Endoscopy;  Laterality: N/A;  . CHOLECYSTECTOMY N/A 05/03/2015   Procedure: LAPAROSCOPIC CHOLECYSTECTOMY WITH INTRAOPERATIVE CHOLANGIOGRAM , LAPAROSCOPIC GASTROTOMY  ;  Surgeon: Alphonsa Overall, MD;  Location: [facility];  Service: General;  Laterality: N/A;  . ERCP N/A 05/03/2015   Procedure: ENDOSCOPIC RETROGRADE CHOLANGIOPANCREATOGRAPHY (ERCP);  Surgeon: Carol Ada, MD;  Location: [facility];  Service: Gastroenterology;  Laterality: N/A;  . ESOPHAGOGASTRODUODENOSCOPY N/A 02/26/2012   Procedure: ESOPHAGOGASTRODUODENOSCOPY (EGD);  Surgeon: Garlan Fair, MD;  Location: [facility];  Service: Endoscopy;  Laterality: N/A;  . ESOPHAGOGASTRODUODENOSCOPY (EGD) WITH PROPOFOL N/A 05/02/2015   Procedure: ESOPHAGOGASTRODUODENOSCOPY (EGD) WITH PROPOFOL;  Surgeon: Arta Silence, MD;  Location: [facility];  Service: Endoscopy;  Laterality: N/A;  . TONSILLECTOMY      SOCIAL HISTORY: Social History   Socioeconomic History  . Marital status: Married    Spouse name: Not on file  . Number of children: Not on file  . Years of education: Not on file  . Highest education level: Not on file  Social Needs  . Financial resource strain: Not on file  . Food insecurity - worry: Not on file  . Food insecurity - inability: Not on file  . Transportation needs - medical: Not on file  . Transportation needs - non-medical: Not on file  Occupational History  . Not on file  Tobacco Use  . Smoking status:  Former Smoker    Last attempt to quit: 1979    Years since quitting: 40.2  . Smokeless tobacco: Never Used  Substance and Sexual Activity  . Alcohol use: Yes    Alcohol/week: 1.8 oz    Types: 3 Shots of liquor per week    Comment: occasional  . Drug use: No  . Sexual activity: Not on file  Other Topics Concern  . Not on file  Social History Narrative  . Not on file    FAMILY HISTORY: Family History    Problem Relation Age of Onset  . Cancer Mother   . Breast cancer Mother   . Stroke Mother   . Diabetes Neg Hx   . Hypertension Neg Hx     ALLERGIES:  has No Known Allergies.  MEDICATIONS:  Current Outpatient Medications  Medication Sig Dispense Refill  . acetaminophen (TYLENOL) 500 MG tablet Take 1,000-1,500 mg by mouth 2 (two) times daily.     Marland Kitchen amoxicillin-clavulanate (AUGMENTIN) 875-125 MG tablet Take 1 tablet by mouth 2 (two) times daily. 6 tablet 0  . cholecalciferol (VITAMIN D) 1000 units tablet Take 1,000 Units by mouth daily.    . fludrocortisone (FLORINEF) 0.1 MG tablet Take 0.1 mg by mouth every morning.  0  . latanoprost (XALATAN) 0.005 % ophthalmic solution Place 1 drop into both eyes at bedtime.    . Multiple Vitamin (MULTIVITAMIN WITH MINERALS) TABS tablet Take 1 tablet by mouth daily.    Marland Kitchen omeprazole (PRILOSEC) 20 MG capsule Take 20 mg by mouth daily with breakfast.  2   No current facility-administered medications for this visit.     REVIEW OF SYSTEMS:    10 Point review of Systems was done is negative except as noted above.  PHYSICAL EXAMINATION: ECOG PERFORMANCE STATUS: 2 - Symptomatic, <50% confined to bed  . Vitals:   03/12/17 1257  BP: 138/72  Pulse: 81  Resp: 16  Temp: 98.1 F (36.7 C)  SpO2: 97%   Filed Weights   03/12/17 1257  Weight: 217 lb 14.4 oz (98.8 kg)   .Body mass index is 25.18 kg/m.  GENERAL:alert, in no acute distress and comfortable SKIN: no acute rashes, no significant lesions EYES: conjunctiva are pink and non-injected, sclera anicteric OROPHARYNX: MMM, no exudates, no oropharyngeal erythema or ulceration NECK: supple, no JVD LYMPH:  no palpable lymphadenopathy in the cervical, axillary or inguinal regions LUNGS: clear to auscultation b/l with normal respiratory effort HEART: regular rate & rhythm ABDOMEN:  normoactive bowel sounds , non tender, not distended. Extremity: no pedal edema PSYCH: alert & oriented x 3 with  fluent speech NEURO: no focal motor/sensory deficits  LABORATORY DATA:  I have reviewed the data as listed  . CBC Latest Ref Rng & Units 03/12/2017 05/05/2015 05/04/2015  WBC 4.0 - 10.3 K/uL 14.5(H) 14.0(H) 20.9(H)  Hemoglobin 13.0 - 17.0 g/dL - 10.3(L) 10.9(L)  Hematocrit 38.4 - 49.9 % 39.2 30.4(L) 31.2(L)  Platelets 140 - 400 K/uL 235 235 271  HGB 13.1  . CBC    Component Value Date/Time   WBC 14.5 (H) 03/12/2017 1355   WBC 14.0 (H) 05/05/2015 0445   RBC 4.28 03/12/2017 1355   RBC 4.28 03/12/2017 1355   HGB 10.3 (L) 05/05/2015 0445   HCT 39.2 03/12/2017 1355   PLT 235 03/12/2017 1355   MCV 91.6 03/12/2017 1355   MCH 30.6 03/12/2017 1355   MCHC 33.4 03/12/2017 1355   RDW 12.8 03/12/2017 1355   LYMPHSABS 5.4 (H) 03/12/2017 1355  MONOABS 0.6 03/12/2017 1355   EOSABS 0.1 03/12/2017 1355   BASOSABS 0.0 03/12/2017 1355     . CMP Latest Ref Rng & Units 03/12/2017 05/05/2015 05/04/2015  Glucose 70 - 140 mg/dL 107 121(H) 202(H)  BUN 7 - 26 mg/dL _0 Creatinine 0.70 - 1.30 mg/dL 1.22 1.06 1.13  Sodium 136 - 145 mmol/L 140 143 139  Potassium 3.5 - 5.1 mmol/L 4.1 3.6 4.0  Chloride 98 - 109 mmol/L 106 108 106  CO2 22 - 29 mmol/L _1 Calcium 8.4 - 10.4 mg/dL 9.3 8.3(L) 8.4(L)  Total Protein 6.4 - 8.3 g/dL 6.4 5.6(L) 5.8(L)  Total Bilirubin 0.2 - 1.2 mg/dL 0.9 1.1 1.4(H)  Alkaline Phos 40 - 150 U/L 50 348(H) 441(H)  AST 5 - 34 U/L 8 37 42(H)  ALT 0 - 55 U/L 11 81(H) 85(H)   . Lab Results  Component Value Date   LDH 127 03/12/2017   Component     Latest Ref Rng & Units 03/12/2017  IgG (Immunoglobin G), Serum     700 - 1,600 mg/dL 546 (L)  IgA     61 - 437 mg/dL 59 (L)  IgM (Immunoglobulin M), Srm     15 - 143 mg/dL 32  Total Protein ELP     6.0 - 8.5 g/dL 6.1  Albumin SerPl Elph-Mcnc     2.9 - 4.4 g/dL 3.7  Alpha 1     0.0 - 0.4 g/dL 0.2  Alpha2 Glob SerPl Elph-Mcnc     0.4 - 1.0 g/dL 0.7  B-Globulin SerPl Elph-Mcnc     0.7 - 1.3 g/dL 0.9  Gamma Glob  SerPl Elph-Mcnc     0.4 - 1.8 g/dL 0.6  M Protein SerPl Elph-Mcnc     Not Observed g/dL Not Observed  Globulin, Total     2.2 - 3.9 g/dL 2.4  Albumin/Glob SerPl     0.7 - 1.7 1.6  IFE 1      Comment  Please Note (HCV):      Comment  Kappa free light chain     3.3 - 19.4 mg/L 15.7  Lamda free light chains     5.7 - 26.3 mg/L 14.4  Kappa, lamda light chain ratio     0.26 - 1.65 1.09  Retic Ct Pct     0.8 - 1.8 % 0.7 (L)  RBC.     4.20 - 5.82 MIL/uL 4.28  Retic Count, Absolute     34.8 - 93.9 K/uL 30.0 (L)  HCV Ab     0.0 - 0.9 s/co ratio <0.1  LDH     125 - 245 U/L 127     OUTSIDE LABS      RADIOGRAPHIC STUDIES: I have personally reviewed the radiological images as listed and agreed with the findings in the report. No results found.  ASSESSMENT & PLAN:  Json Koelzer is a 77 y.o. caucasian male with    1. Lymphocytosis  Labs show Lymphocytes ranging 5-6K No clinically palpable enlarged lymph nodes, splenomegaly or hepatomegaly.  LDH WNL Hep C neg  80% of lymphocytes are clonal Cd5+ B lymphocytes. CLL vs Monoclonal- B lymphocytosis based on presence of LNadenopathy or splenomegaly.  PLAN:  -I reviewed his available outside labs and symptoms with the patient and his wife.  -I discussed lymphocytosis can be reactionary by  inflammatory process. If this is clonal, this is concern for CLL.  -To rule out clonal cause, I will get a  flow cytometry and blood workup.  -workup ordered as noted below and reviewed -Based on clonal Cd5+ lymphocyte numbers <5k-- this likely represents Monoclonal B lymphocytes. However given his significant fatigue will rpt imaging to evaluate for LNadenopathy or splenomegaly which if present would represent a diagnosis of CLL instead.  -PET/CT ordered. . Orders Placed This Encounter  Procedures  . CBC & Diff and Retic    Standing Status:   Future    Number of Occurrences:   1    Standing Expiration Date:   03/13/2018  . CMP ([facility] only)    Standing Status:   Future    Number of Occurrences:   1    Standing Expiration Date:   03/13/2018  . Lactate dehydrogenase    Standing Status:   Future    Number of Occurrences:   1    Standing Expiration Date:   03/13/2018  . Multiple Myeloma Panel (SPEP&IFE w/QIG)    Standing Status:   Future    Number of Occurrences:   1    Standing Expiration Date:   03/13/2018  . Kappa/lambda light chains    Standing Status:   Future    Number of Occurrences:   1    Standing Expiration Date:   04/16/2018  . Flow Cytometry    Lymphocytosis ? Clonal vs reactive.    Standing Status:   Future    Number of Occurrences:   1    Standing Expiration Date:   03/13/2018  . Hepatitis C antibody    Standing Status:   Future    Number of Occurrences:   1    Standing Expiration Date:   03/12/2018  . Smear    Standing Status:   Future    Number of Occurrences:   1    Standing Expiration Date:   03/13/2018   2) . Patient Active Problem List   Diagnosis Date Noted  . Normocytic anemia   . Gallstone pancreatitis 04/30/2015  . Orthostatic hypotension 04/30/2015  . Pancreatitis 04/30/2015  . Acute gallstone pancreatitis   Plan -continue f/u with PCP for this.  Labs today PET/CT in 1 week RTC with Dr Irene Limbo in 2 weeks   All of the patients questions were answered with apparent satisfaction. The patient knows to call the clinic with any problems, questions or concerns.  I spent 45 minutes counseling the patient face to face. The total time spent in the appointment was 60 minutes and more than 50% was on counseling and direct patient cares.    Sullivan Lone MD MS AAHIVMS [facility] Hematology/Oncology Physician [facility]  (Office):       [phone] (Work cell):  [phone] (Fax):           [phone]  03/12/2017 1:37 PM   This document serves as a record of services personally performed by Sullivan Lone, MD. It was created on his behalf by Joslyn Devon, a trained  medical scribe. The creation of this record is based on the scribe's personal observations and the provider's statements to them.    .I have reviewed the above documentation for accuracy and completeness, and I agree with the above. Brunetta Genera MD MS

## 2017-03-12 ENCOUNTER — Inpatient Hospital Stay: Payer: Medicare Other | Attending: Hematology | Admitting: Hematology

## 2017-03-12 ENCOUNTER — Encounter: Payer: Self-pay | Admitting: Hematology

## 2017-03-12 ENCOUNTER — Telehealth: Payer: Self-pay | Admitting: Hematology

## 2017-03-12 ENCOUNTER — Inpatient Hospital Stay: Payer: Medicare Other

## 2017-03-12 VITALS — BP 138/72 | HR 81 | Temp 98.1°F | Resp 16 | Ht 78.0 in | Wt 217.9 lb

## 2017-03-12 DIAGNOSIS — D649 Anemia, unspecified: Secondary | ICD-10-CM | POA: Insufficient documentation

## 2017-03-12 DIAGNOSIS — D72820 Lymphocytosis (symptomatic): Secondary | ICD-10-CM

## 2017-03-12 DIAGNOSIS — F419 Anxiety disorder, unspecified: Secondary | ICD-10-CM

## 2017-03-12 DIAGNOSIS — Z87891 Personal history of nicotine dependence: Secondary | ICD-10-CM | POA: Diagnosis not present

## 2017-03-12 DIAGNOSIS — Z79899 Other long term (current) drug therapy: Secondary | ICD-10-CM | POA: Diagnosis not present

## 2017-03-12 DIAGNOSIS — Y93H2 Activity, gardening and landscaping: Secondary | ICD-10-CM | POA: Insufficient documentation

## 2017-03-12 DIAGNOSIS — D479 Neoplasm of uncertain behavior of lymphoid, hematopoietic and related tissue, unspecified: Secondary | ICD-10-CM

## 2017-03-12 LAB — SAVE SMEAR

## 2017-03-12 LAB — CBC WITH DIFFERENTIAL (CANCER CENTER ONLY)
BASOS PCT: 0 %
Basophils Absolute: 0 10*3/uL (ref 0.0–0.1)
EOS ABS: 0.1 10*3/uL (ref 0.0–0.5)
EOS PCT: 1 %
HCT: 39.2 % (ref 38.4–49.9)
HEMOGLOBIN: 13.1 g/dL (ref 13.0–17.1)
Lymphocytes Relative: 37 %
Lymphs Abs: 5.4 10*3/uL — ABNORMAL HIGH (ref 0.9–3.3)
MCH: 30.6 pg (ref 27.2–33.4)
MCHC: 33.4 g/dL (ref 32.0–36.0)
MCV: 91.6 fL (ref 79.3–98.0)
Monocytes Absolute: 0.6 10*3/uL (ref 0.1–0.9)
Monocytes Relative: 4 %
NEUTROS PCT: 58 %
Neutro Abs: 8.3 10*3/uL — ABNORMAL HIGH (ref 1.5–6.5)
PLATELETS: 235 10*3/uL (ref 140–400)
RBC: 4.28 MIL/uL (ref 4.20–5.82)
RDW: 12.8 % (ref 11.0–14.6)
WBC: 14.5 10*3/uL — AB (ref 4.0–10.3)

## 2017-03-12 LAB — CMP (CANCER CENTER ONLY)
ALK PHOS: 50 U/L (ref 40–150)
ALT: 11 U/L (ref 0–55)
ANION GAP: 8 (ref 3–11)
AST: 8 U/L (ref 5–34)
Albumin: 3.9 g/dL (ref 3.5–5.0)
BUN: 22 mg/dL (ref 7–26)
CALCIUM: 9.3 mg/dL (ref 8.4–10.4)
CO2: 26 mmol/L (ref 22–29)
Chloride: 106 mmol/L (ref 98–109)
Creatinine: 1.22 mg/dL (ref 0.70–1.30)
GFR, EST NON AFRICAN AMERICAN: 56 mL/min — AB (ref >60)
Glucose, Bld: 107 mg/dL (ref 70–140)
Potassium: 4.1 mmol/L (ref 3.5–5.1)
SODIUM: 140 mmol/L (ref 136–145)
Total Bilirubin: 0.9 mg/dL (ref 0.2–1.2)
Total Protein: 6.4 g/dL (ref 6.4–8.3)

## 2017-03-12 LAB — RETICULOCYTES
RBC.: 4.28 MIL/uL (ref 4.20–5.82)
RETIC CT PCT: 0.7 % — AB (ref 0.8–1.8)
Retic Count, Absolute: 30 10*3/uL — ABNORMAL LOW (ref 34.8–93.9)

## 2017-03-12 LAB — LACTATE DEHYDROGENASE: LDH: 127 U/L (ref 125–245)

## 2017-03-12 NOTE — Telephone Encounter (Signed)
Added lab addon for patient per 3/12 los.

## 2017-03-13 LAB — KAPPA/LAMBDA LIGHT CHAINS
KAPPA FREE LGHT CHN: 15.7 mg/L (ref 3.3–19.4)
Kappa, lambda light chain ratio: 1.09 (ref 0.26–1.65)
LAMDA FREE LIGHT CHAINS: 14.4 mg/L (ref 5.7–26.3)

## 2017-03-13 LAB — HEPATITIS C ANTIBODY

## 2017-03-15 LAB — MULTIPLE MYELOMA PANEL, SERUM
ALBUMIN SERPL ELPH-MCNC: 3.7 g/dL (ref 2.9–4.4)
ALPHA 1: 0.2 g/dL (ref 0.0–0.4)
Albumin/Glob SerPl: 1.6 (ref 0.7–1.7)
Alpha2 Glob SerPl Elph-Mcnc: 0.7 g/dL (ref 0.4–1.0)
B-Globulin SerPl Elph-Mcnc: 0.9 g/dL (ref 0.7–1.3)
GLOBULIN, TOTAL: 2.4 g/dL (ref 2.2–3.9)
Gamma Glob SerPl Elph-Mcnc: 0.6 g/dL (ref 0.4–1.8)
IGA: 59 mg/dL — AB (ref 61–437)
IGM (IMMUNOGLOBULIN M), SRM: 32 mg/dL (ref 15–143)
IgG (Immunoglobin G), Serum: 546 mg/dL — ABNORMAL LOW (ref 700–1600)
Total Protein ELP: 6.1 g/dL (ref 6.0–8.5)

## 2017-03-18 ENCOUNTER — Telehealth: Payer: Self-pay | Admitting: *Deleted

## 2017-03-18 NOTE — Telephone Encounter (Signed)
Per staff message, called patient to inform him of need for PET scan.  Patient agreeable.  Will also set patient up for MD follow up in approximately  2 weeks.  Message sent to scheduling.

## 2017-03-18 NOTE — Telephone Encounter (Signed)
-----   Message from Brunetta Genera, MD sent at 03/15/2017 10:31 PM EDT ----- Regarding: PET/CT and f/u Hi Haizlee Henton, Could you plz let Mr Vanwyhe know that testing showed clonal lymphocytes ?CLL> Will ned PET/CT to evaluate further. PET/CT ordered -- plz help schedule in 1 week and clinic f/u with me in 2 weeks. Thanks [place]

## 2017-03-19 LAB — FLOW CYTOMETRY

## 2017-03-25 ENCOUNTER — Ambulatory Visit (HOSPITAL_COMMUNITY)
Admission: RE | Admit: 2017-03-25 | Discharge: 2017-03-25 | Disposition: A | Discharge status: Home / Self Care | Payer: Medicare Other | Source: Ambulatory Visit | Attending: Hematology | Admitting: Hematology

## 2017-03-25 DIAGNOSIS — I700 Atherosclerosis of aorta: Secondary | ICD-10-CM | POA: Insufficient documentation

## 2017-03-25 DIAGNOSIS — K449 Diaphragmatic hernia without obstruction or gangrene: Secondary | ICD-10-CM | POA: Diagnosis not present

## 2017-03-25 DIAGNOSIS — D479 Neoplasm of uncertain behavior of lymphoid, hematopoietic and related tissue, unspecified: Secondary | ICD-10-CM | POA: Diagnosis not present

## 2017-03-25 DIAGNOSIS — C8300 Small cell B-cell lymphoma, unspecified site: Secondary | ICD-10-CM | POA: Diagnosis not present

## 2017-03-25 DIAGNOSIS — K5730 Diverticulosis of large intestine without perforation or abscess without bleeding: Secondary | ICD-10-CM | POA: Diagnosis not present

## 2017-03-25 DIAGNOSIS — I2510 Atherosclerotic heart disease of native coronary artery without angina pectoris: Secondary | ICD-10-CM | POA: Diagnosis not present

## 2017-03-25 LAB — GLUCOSE, CAPILLARY: GLUCOSE-CAPILLARY: 105 mg/dL — AB (ref 65–99)

## 2017-03-25 MED ORDER — FLUDEOXYGLUCOSE F - 18 (FDG) INJECTION
8.1700 | Freq: Once | INTRAVENOUS | Status: AC | PRN
  Administered 2017-03-25: 8.17 via INTRAVENOUS

## 2017-03-28 NOTE — Progress Notes (Signed)
HEMATOLOGY/ONCOLOGY CONSULTATION NOTE  Date of Service: 04/02/2017  Patient Care Team: Lajean Manes, MD as PCP - General (Internal Medicine)  CHIEF COMPLAINTS/PURPOSE OF CONSULTATION:  F/u for monoclonal B Lymphocytosis   HISTORY OF PRESENTING ILLNESS:   Bobby [name] is a wonderful 77 y.o. male who has been referred to [place] by Dr. Lajean Manes, his PCP, for evaluation and management of lymphocytosis.  He presents to the clinic today accompanied by his wife.  He notes lately he has felt low energy and low stamina. This change is not sudden but still a significant drop in the past 6 months. About 1 year prior he started to feel more presence of fatigue. He denied and lumps or bumps and palpable lymph nodes. No enlarged lymph nodes in his last CT from 2017.   On review of symptoms, pt notes significant fatigue from doing heavy yard work and walking a mile and his back now gets tired sooner. He denies SOB. He notes stable weight. He denies fever, chills, night sweats, abdominal pain, skin rash or itching or leg swelling. He denies recent viral infection.   He has been on low dose Fludrocortisone for 2-3 years for his orthostatic hypotension. He attributes this hypotension to prior alcohol use and other issues. He cut back on tylenol which he takes for osteoarthritis. No new medication change in the past 6 months.He denies iron or other vitamin deficiency. He notes he had a tight esophagus and had it stretched 5 years ago. He had gallbladder removed in 2017. Pt notes having to chew his food very well before swallowing. He was a smoker 40 years ago. He denies chemical exposures in the past. He is up to date on his cancer screenings. He has never had a blood transfusion.    INTERVAL HISTORY   Bobby [name] is here for a follow up and to discuss his lab workup and PET scan. He presents to the clinic today accompanied by his wife. Pt notes he feels no different from last visit.   On review of  symptoms, pt notes he is no longer going out and doing yard work as much as he use to. His wife notes he will take a shower and will have to lay down after a while. He notes his fatigue has been more of an issue since being on Florinef.  No fevers/chills/night sweats/weight loss.   MEDICAL HISTORY:  Past Medical History:  Diagnosis Date  . Anxiety   . Diverticulosis   . Orthostatic hypotension   . Tinnitus     SURGICAL HISTORY: Past Surgical History:  Procedure Laterality Date  . BALLOON DILATION N/A 02/26/2012   Procedure: BALLOON DILATION;  Surgeon: Garlan Fair, MD;  Location: [facility];  Service: Endoscopy;  Laterality: N/A;  . CHOLECYSTECTOMY N/A 05/03/2015   Procedure: LAPAROSCOPIC CHOLECYSTECTOMY WITH INTRAOPERATIVE CHOLANGIOGRAM , LAPAROSCOPIC GASTROTOMY  ;  Surgeon: Alphonsa Overall, MD;  Location: [facility];  Service: General;  Laterality: N/A;  . ERCP N/A 05/03/2015   Procedure: ENDOSCOPIC RETROGRADE CHOLANGIOPANCREATOGRAPHY (ERCP);  Surgeon: Carol Ada, MD;  Location: [facility];  Service: Gastroenterology;  Laterality: N/A;  . ESOPHAGOGASTRODUODENOSCOPY N/A 02/26/2012   Procedure: ESOPHAGOGASTRODUODENOSCOPY (EGD);  Surgeon: Garlan Fair, MD;  Location: [facility];  Service: Endoscopy;  Laterality: N/A;  . ESOPHAGOGASTRODUODENOSCOPY (EGD) WITH PROPOFOL N/A 05/02/2015   Procedure: ESOPHAGOGASTRODUODENOSCOPY (EGD) WITH PROPOFOL;  Surgeon: Arta Silence, MD;  Location: [facility];  Service: Endoscopy;  Laterality: N/A;  . TONSILLECTOMY      SOCIAL  HISTORY: Social History   Socioeconomic History  . Marital status: Married    Spouse name: Not on file  . Number of children: Not on file  . Years of education: Not on file  . Highest education level: Not on file  Occupational History  . Not on file  Social Needs  . Financial resource strain: Not on file  . Food insecurity:    Worry: Not on file    Inability: Not on file  . Transportation needs:    Medical: Not on file      Non-medical: Not on file  Tobacco Use  . Smoking status: Former Smoker    Last attempt to quit: 1979    Years since quitting: 40.2  . Smokeless tobacco: Never Used  Substance and Sexual Activity  . Alcohol use: Yes    Alcohol/week: 1.8 oz    Types: 3 Shots of liquor per week    Comment: occasional  . Drug use: No  . Sexual activity: Not on file  Lifestyle  . Physical activity:    Days per week: Not on file    Minutes per session: Not on file  . Stress: Not on file  Relationships  . Social connections:    Talks on phone: Not on file    Gets together: Not on file    Attends religious service: Not on file    Active member of club or organization: Not on file    Attends meetings of clubs or organizations: Not on file    Relationship status: Not on file  . Intimate partner violence:    Fear of current or ex partner: Not on file    Emotionally abused: Not on file    Physically abused: Not on file    Forced sexual activity: Not on file  Other Topics Concern  . Not on file  Social History Narrative  . Not on file    FAMILY HISTORY: Family History  Problem Relation Age of Onset  . Cancer Mother   . Breast cancer Mother   . Stroke Mother   . Diabetes Neg Hx   . Hypertension Neg Hx     ALLERGIES:  has No Known Allergies.  MEDICATIONS:  Current Outpatient Medications  Medication Sig Dispense Refill  . acetaminophen (TYLENOL) 500 MG tablet Take 1,000-1,500 mg by mouth 2 (two) times daily.     . cholecalciferol (VITAMIN D) 1000 units tablet Take 1,000 Units by mouth daily.    . fludrocortisone (FLORINEF) 0.1 MG tablet Take 0.1 mg by mouth every morning.  0  . latanoprost (XALATAN) 0.005 % ophthalmic solution Place 1 drop into both eyes at bedtime.    . Multiple Vitamin (MULTIVITAMIN WITH MINERALS) TABS tablet Take 1 tablet by mouth daily.    Marland Kitchen omeprazole (PRILOSEC) 20 MG capsule Take 20 mg by mouth daily with breakfast.  2   No current facility-administered  medications for this visit.     REVIEW OF SYSTEMS:   .10 Point review of Systems was done is negative except as noted above.   PHYSICAL EXAMINATION: ECOG PERFORMANCE STATUS: 2 - Symptomatic, <50% confined to bed  Vitals:   04/02/17 1356  BP: 132/73  Pulse: 78  Resp: 18  Temp: 98.8 F (37.1 C)  SpO2: 98%   Filed Weights   04/02/17 1356  Weight: 218 lb 6.4 oz (99.1 kg)   .Body mass index is 25.24 kg/m. Marland Kitchen GENERAL:alert, in no acute distress and comfortable SKIN: no acute rashes, no significant  lesions EYES: conjunctiva are pink and non-injected, sclera anicteric OROPHARYNX: MMM, no exudates, no oropharyngeal erythema or ulceration NECK: supple, no JVD LYMPH:  no palpable lymphadenopathy in the cervical, axillary or inguinal regions LUNGS: clear to auscultation b/l with normal respiratory effort HEART: regular rate & rhythm ABDOMEN:  normoactive bowel sounds , non tender, not distended. Extremity: no pedal edema PSYCH: alert & oriented x 3 with fluent speech NEURO: no focal motor/sensory deficits   LABORATORY DATA:  I have reviewed the data as listed  . CBC Latest Ref Rng & Units 03/12/2017 05/05/2015 05/04/2015  WBC 4.0 - 10.3 K/uL 14.5(H) 14.0(H) 20.9(H)  Hemoglobin 13.0 - 17.0 g/dL - 10.3(L) 10.9(L)  Hematocrit 38.4 - 49.9 % 39.2 30.4(L) 31.2(L)  Platelets 140 - 400 K/uL 235 235 271  HGB 13.1  . CBC    Component Value Date/Time   WBC 14.5 (H) 03/12/2017 1355   WBC 14.0 (H) 05/05/2015 0445   RBC 4.28 03/12/2017 1355   RBC 4.28 03/12/2017 1355   HGB 10.3 (L) 05/05/2015 0445   HCT 39.2 03/12/2017 1355   PLT 235 03/12/2017 1355   MCV 91.6 03/12/2017 1355   MCH 30.6 03/12/2017 1355   MCHC 33.4 03/12/2017 1355   RDW 12.8 03/12/2017 1355   LYMPHSABS 5.4 (H) 03/12/2017 1355   MONOABS 0.6 03/12/2017 1355   EOSABS 0.1 03/12/2017 1355   BASOSABS 0.0 03/12/2017 1355     . CMP Latest Ref Rng & Units 03/12/2017 05/05/2015 05/04/2015  Glucose 70 - 140 mg/dL 107 121(H)  202(H)  BUN 7 - 26 mg/dL 22 8 14   Creatinine 0.70 - 1.30 mg/dL 1.22 1.06 1.13  Sodium 136 - 145 mmol/L 140 143 139  Potassium 3.5 - 5.1 mmol/L 4.1 3.6 4.0  Chloride 98 - 109 mmol/L 106 108 106  CO2 22 - 29 mmol/L 26 26 23   Calcium 8.4 - 10.4 mg/dL 9.3 8.3(L) 8.4(L)  Total Protein 6.4 - 8.3 g/dL 6.4 5.6(L) 5.8(L)  Total Bilirubin 0.2 - 1.2 mg/dL 0.9 1.1 1.4(H)  Alkaline Phos 40 - 150 U/L 50 348(H) 441(H)  AST 5 - 34 U/L 8 37 42(H)  ALT 0 - 55 U/L 11 81(H) 85(H)   . Lab Results  Component Value Date   LDH 127 03/12/2017   Component     Latest Ref Rng & Units 03/12/2017  IgG (Immunoglobin G), Serum     700 - 1,600 mg/dL 546 (L)  IgA     61 - 437 mg/dL 59 (L)  IgM (Immunoglobulin M), Srm     15 - 143 mg/dL 32  Total Protein ELP     6.0 - 8.5 g/dL 6.1  Albumin SerPl Elph-Mcnc     2.9 - 4.4 g/dL 3.7  Alpha 1     0.0 - 0.4 g/dL 0.2  Alpha2 Glob SerPl Elph-Mcnc     0.4 - 1.0 g/dL 0.7  B-Globulin SerPl Elph-Mcnc     0.7 - 1.3 g/dL 0.9  Gamma Glob SerPl Elph-Mcnc     0.4 - 1.8 g/dL 0.6  M Protein SerPl Elph-Mcnc     Not Observed g/dL Not Observed  Globulin, Total     2.2 - 3.9 g/dL 2.4  Albumin/Glob SerPl     0.7 - 1.7 1.6  IFE 1      Comment  Please Note (HCV):      Comment  Kappa free light chain     3.3 - 19.4 mg/L 15.7  Lamda free light chains  5.7 - 26.3 mg/L 14.4  Kappa, lamda light chain ratio     0.26 - 1.65 1.09  Retic Ct Pct     0.8 - 1.8 % 0.7 (L)  RBC.     4.20 - 5.82 MIL/uL 4.28  Retic Count, Absolute     34.8 - 93.9 K/uL 30.0 (L)  HCV Ab     0.0 - 0.9 s/co ratio <0.1  LDH     125 - 245 U/L 127     OUTSIDE LABS    RADIOGRAPHIC STUDIES: I have personally reviewed the radiological images as listed and agreed with the findings in the report. Nm Pet Image Initial (pi) Skull Base To Thigh  Result Date: 03/25/2017 CLINICAL DATA:  Initial treatment strategy for small lymphocytic lymphoma. EXAM: NUCLEAR MEDICINE PET SKULL BASE TO THIGH  TECHNIQUE: 8.1 mCi F-18 FDG was injected intravenously. Full-ring PET imaging was performed from the skull base to thigh after the radiotracer. CT data was obtained and used for attenuation correction and anatomic localization. Fasting blood glucose: 105 mg/dl COMPARISON:  04/29/2015 CT abdomen/pelvis. FINDINGS: Mediastinal blood pool activity: SUV max 2.4 NECK: No hypermetabolic lymph nodes in the neck. Incidental CT findings: Non hypermetabolic hypodense 2.2 cm thyroid isthmus nodule. CHEST: No enlarged or hypermetabolic axillary, mediastinal or hilar lymph nodes. No hypermetabolic pulmonary findings. Incidental CT findings: Prominently patulous thoracic esophagus with esophageal fluid level. Nonspecific circumferential wall thickening in the mid to lower thoracic esophagus. No focal esophageal mass or esophageal hypermetabolism. Left anterior descending coronary atherosclerosis. Atherosclerotic nonaneurysmal thoracic aorta. Subpleural 6 mm basilar right lower lobe pulmonary nodule (series 8/image 63) is below PET resolution and stable since 04/29/2015, considered benign. No acute consolidative airspace disease, lung masses or additional significant pulmonary nodules. ABDOMEN/PELVIS: No abnormal hypermetabolic activity within the liver, pancreas, adrenal glands, or spleen. No hypermetabolic lymph nodes in the abdomen or pelvis. Incidental CT findings: Cholecystectomy. Atherosclerotic nonaneurysmal abdominal aorta. Small hiatal hernia. Marked colonic diverticulosis, most prominent in the sigmoid colon. Top-normal size prostate. Stable diffuse bladder wall thickening and trabeculation. SKELETON: No focal hypermetabolic activity to suggest skeletal metastasis. Incidental CT findings: none IMPRESSION: 1. No hypermetabolic evidence of a lymphoproliferative process. No lymphadenopathy. Normal size spleen. No osseous hypermetabolism. 2. Small hiatal hernia. Prominently patulous thoracic esophagus with esophageal fluid  level, suggesting prominent chronic esophageal dysmotility and/or gastroesophageal reflux disease. Nonspecific circumferential wall thickening in the mid to lower thoracic esophagus, without discrete esophageal mass or hypermetabolism, differential includes reflux esophagitis, Barrett's esophagus or neoplasm. Upper endoscopic correlation may be obtained as clinically warranted. 3. Chronic findings include: Aortic Atherosclerosis (ICD10-I70.0). Coronary atherosclerosis. Marked colonic diverticulosis. Electronically Signed   By: Ilona Sorrel M.D.   On: 03/25/2017 16:49    ASSESSMENT & PLAN:  Bobby [name] is a 77 y.o. caucasian male with    1. Lymphocytosis - workup consistent with Monoclonal B Lymphocytosis  Labs show Lymphocytes ranging 5-6K of which about 80% <5k were clonal with CD5+ (less than needed for a diagnosis of CLL) Also PET/CT with no Lnadenopathy or hepato-splenomegaly No clinically palpable enlarged lymph nodes, splenomegaly or hepatomegaly.  LDH WNL Hep C neg  PET from 03/25/17 with 1. No hypermetabolic evidence of a lymphoproliferative process. No lymphadenopathy. Normal size spleen. No osseous hypermetabolism. 2. Small hiatal hernia. Prominently patulous thoracic esophagus with esophageal fluid level, suggesting prominent chronic esophageal dysmotility and/or gastroesophageal reflux disease. Nonspecific circumferential wall thickening in the mid to lower thoracic esophagus, without discrete esophageal mass or hypermetabolism, differential includes reflux esophagitis,  Barrett's esophagus or neoplasm. Upper endoscopic correlation may be obtained as clinically warranted. 3. Chronic findings include: Aortic Atherosclerosis (ICD10-I70.0). Coronary atherosclerosis. Marked colonic diverticulosis.   PLAN:  -I reviewed his lab workup and PET/CT scan in details. -I discussed his PET from 03/25/17 which shows no enlarged lymph nodes or splenomegaly which leads his diagnosis to  Monoclonal B Lymphocytosis with <5k CD5+ clonal lymphocytes. -I discussed a fraction of people with this diagnosis will transition to CLL  -Treatment options are not recommended at this time given the side effects outweigh the burden of disease. We would treat as needed.  -Will proceed with observation with regular labs through PCP inbetween follow ups.  -I encouraged him to watch for constitutional symptoms and other symptoms of cytopenias  2) . Patient Active Problem List   Diagnosis Date Noted  . Normocytic anemia   . Gallstone pancreatitis 04/30/2015  . Orthostatic hypotension 04/30/2015  . Pancreatitis 04/30/2015  . Acute gallstone pancreatitis   Plan -continue f/u with PCP for this.   3. Fatigue  -I encouraged him to disuses his use of Florinef and monitor his Thyroid function as this can have some effect on his fatigue.    RTC with Dr Irene Limbo in 4 months with labs  All of the patients questions were answered with apparent satisfaction. The patient knows to call the clinic with any problems, questions or concerns.  . The total time spent in the appointment was 20 minutes and more than 50% was on counseling and direct patient cares.   Sullivan Lone MD [place] AAHIVMS [facility] Hematology/Oncology Physician [facility]  (Office):       [phone] (Work cell):  [phone] (Fax):           [phone]  04/02/2017 3:05 PM  This document serves as a record of services personally performed by Sullivan Lone, MD. It was created on his behalf by Joslyn Devon, a trained medical scribe. The creation of this record is based on the scribe's personal observations and the provider's statements to them.    .I have reviewed the above documentation for accuracy and completeness, and I agree with the above. Brunetta Genera MD MS

## 2017-04-02 ENCOUNTER — Inpatient Hospital Stay: Payer: Medicare Other | Attending: Hematology | Admitting: Hematology

## 2017-04-02 ENCOUNTER — Encounter: Payer: Self-pay | Admitting: Hematology

## 2017-04-02 VITALS — BP 132/73 | HR 78 | Temp 98.8°F | Resp 18 | Ht 78.0 in | Wt 218.4 lb

## 2017-04-02 DIAGNOSIS — D649 Anemia, unspecified: Secondary | ICD-10-CM | POA: Insufficient documentation

## 2017-04-02 DIAGNOSIS — Z87891 Personal history of nicotine dependence: Secondary | ICD-10-CM | POA: Insufficient documentation

## 2017-04-02 DIAGNOSIS — Z79899 Other long term (current) drug therapy: Secondary | ICD-10-CM | POA: Diagnosis not present

## 2017-04-02 DIAGNOSIS — D72820 Lymphocytosis (symptomatic): Secondary | ICD-10-CM | POA: Diagnosis not present

## 2017-04-03 ENCOUNTER — Telehealth: Payer: Self-pay

## 2017-04-03 NOTE — Telephone Encounter (Signed)
Spoke with patient concerning upcoming appointment. Per 4/3 los

## 2017-04-29 DIAGNOSIS — Z85828 Personal history of other malignant neoplasm of skin: Secondary | ICD-10-CM | POA: Diagnosis not present

## 2017-04-29 DIAGNOSIS — L812 Freckles: Secondary | ICD-10-CM | POA: Diagnosis not present

## 2017-04-29 DIAGNOSIS — D1801 Hemangioma of skin and subcutaneous tissue: Secondary | ICD-10-CM | POA: Diagnosis not present

## 2017-04-29 DIAGNOSIS — D2262 Melanocytic nevi of left upper limb, including shoulder: Secondary | ICD-10-CM | POA: Diagnosis not present

## 2017-04-29 DIAGNOSIS — L814 Other melanin hyperpigmentation: Secondary | ICD-10-CM | POA: Diagnosis not present

## 2017-04-29 DIAGNOSIS — L821 Other seborrheic keratosis: Secondary | ICD-10-CM | POA: Diagnosis not present

## 2017-07-09 DIAGNOSIS — H401121 Primary open-angle glaucoma, left eye, mild stage: Secondary | ICD-10-CM | POA: Diagnosis not present

## 2017-07-09 DIAGNOSIS — H25013 Cortical age-related cataract, bilateral: Secondary | ICD-10-CM | POA: Diagnosis not present

## 2017-07-09 DIAGNOSIS — H401112 Primary open-angle glaucoma, right eye, moderate stage: Secondary | ICD-10-CM | POA: Diagnosis not present

## 2017-07-09 DIAGNOSIS — H2513 Age-related nuclear cataract, bilateral: Secondary | ICD-10-CM | POA: Diagnosis not present

## 2017-07-30 NOTE — Progress Notes (Signed)
HEMATOLOGY/ONCOLOGY CLINIC NOTE  Date of Service: 07/31/2017  Patient Care Team: Lajean Manes, MD as PCP - General (Internal Medicine)  CHIEF COMPLAINTS/PURPOSE OF CONSULTATION:  F/u for monoclonal B Lymphocytosis   HISTORY OF PRESENTING ILLNESS:   Bobby [name] is a wonderful 77 y.o. male who has been referred to [place] by Dr. Lajean Manes, his PCP, for evaluation and management of lymphocytosis.  He presents to the clinic today accompanied by his wife.  He notes lately he has felt low energy and low stamina. This change is not sudden but still a significant drop in the past 6 months. About 1 year prior he started to feel more presence of fatigue. He denied and lumps or bumps and palpable lymph nodes. No enlarged lymph nodes in his last CT from 2017.   On review of symptoms, pt notes significant fatigue from doing heavy yard work and walking a mile and his back now gets tired sooner. He denies SOB. He notes stable weight. He denies fever, chills, night sweats, abdominal pain, skin rash or itching or leg swelling. He denies recent viral infection.   He has been on low dose Fludrocortisone for 2-3 years for his orthostatic hypotension. He attributes this hypotension to prior alcohol use and other issues. He cut back on tylenol which he takes for osteoarthritis. No new medication change in the past 6 months.He denies iron or other vitamin deficiency. He notes he had a tight esophagus and had it stretched 5 years ago. He had gallbladder removed in 2017. Pt notes having to chew his food very well before swallowing. He was a smoker 40 years ago. He denies chemical exposures in the past. He is up to date on his cancer screenings. He has never had a blood transfusion.    INTERVAL HISTORY   Garritt [name] is here for management and evaluation of his Monoclonal B-Cell Lymphocytosis. The patient's last visit with [place] was on 04/02/17. The pt reports that he is doing well overall.   The pt reports that  he has had no new symptoms or concerns. He has recently done some traveling to visit family and has kept moderately active. He denies feeling any differently recently as compared to the previous several months. The pt reports that he sees his PCP Dr. Felipa Eth about every 6 months, and will see him in August next.   Lab results today (07/31/17) of CBC w/diff, CMP, and Reticulocytes is as follows: all values are WNL except for WBC at 11.4k, Lymphs abs at 5.7k, Retic ct pct at 0.7%, Retic ct abs at 29.8k, Creatinine at 1.26, Total Protein at 6.4, AST at 8, Total Bilirubin at 1.3, GFR at 54.  On review of systems, pt reports stable energy levels, and denies fevers, chills, night sweats, unexpected weight loss, noticing any new lumps or bumps, new fatigue, new bone pains, abdominal pains, leg swelling, and any other symptoms.   MEDICAL HISTORY:  Past Medical History:  Diagnosis Date  . Anxiety   . Diverticulosis   . Orthostatic hypotension   . Tinnitus     SURGICAL HISTORY: Past Surgical History:  Procedure Laterality Date  . BALLOON DILATION N/A 02/26/2012   Procedure: BALLOON DILATION;  Surgeon: Garlan Fair, MD;  Location: [facility];  Service: Endoscopy;  Laterality: N/A;  . CHOLECYSTECTOMY N/A 05/03/2015   Procedure: LAPAROSCOPIC CHOLECYSTECTOMY WITH INTRAOPERATIVE CHOLANGIOGRAM , LAPAROSCOPIC GASTROTOMY  ;  Surgeon: Alphonsa Overall, MD;  Location: [facility];  Service: General;  Laterality: N/A;  .  ERCP N/A 05/03/2015   Procedure: ENDOSCOPIC RETROGRADE CHOLANGIOPANCREATOGRAPHY (ERCP);  Surgeon: Carol Ada, MD;  Location: [facility];  Service: Gastroenterology;  Laterality: N/A;  . ESOPHAGOGASTRODUODENOSCOPY N/A 02/26/2012   Procedure: ESOPHAGOGASTRODUODENOSCOPY (EGD);  Surgeon: Garlan Fair, MD;  Location: [facility];  Service: Endoscopy;  Laterality: N/A;  . ESOPHAGOGASTRODUODENOSCOPY (EGD) WITH PROPOFOL N/A 05/02/2015   Procedure: ESOPHAGOGASTRODUODENOSCOPY (EGD) WITH PROPOFOL;  Surgeon:  Arta Silence, MD;  Location: [facility];  Service: Endoscopy;  Laterality: N/A;  . TONSILLECTOMY      SOCIAL HISTORY: Social History   Socioeconomic History  . Marital status: Married    Spouse name: Not on file  . Number of children: Not on file  . Years of education: Not on file  . Highest education level: Not on file  Occupational History  . Not on file  Social Needs  . Financial resource strain: Not on file  . Food insecurity:    Worry: Not on file    Inability: Not on file  . Transportation needs:    Medical: Not on file    Non-medical: Not on file  Tobacco Use  . Smoking status: Former Smoker    Last attempt to quit: 1979    Years since quitting: 40.6  . Smokeless tobacco: Never Used  Substance and Sexual Activity  . Alcohol use: Yes    Alcohol/week: 1.8 oz    Types: 3 Shots of liquor per week    Comment: occasional  . Drug use: No  . Sexual activity: Not on file  Lifestyle  . Physical activity:    Days per week: Not on file    Minutes per session: Not on file  . Stress: Not on file  Relationships  . Social connections:    Talks on phone: Not on file    Gets together: Not on file    Attends religious service: Not on file    Active member of club or organization: Not on file    Attends meetings of clubs or organizations: Not on file    Relationship status: Not on file  . Intimate partner violence:    Fear of current or ex partner: Not on file    Emotionally abused: Not on file    Physically abused: Not on file    Forced sexual activity: Not on file  Other Topics Concern  . Not on file  Social History Narrative  . Not on file    FAMILY HISTORY: Family History  Problem Relation Age of Onset  . Cancer Mother   . Breast cancer Mother   . Stroke Mother   . Diabetes Neg Hx   . Hypertension Neg Hx     ALLERGIES:  has No Known Allergies.  MEDICATIONS:  Current Outpatient Medications  Medication Sig Dispense Refill  . acetaminophen (TYLENOL)  500 MG tablet Take 1,000-1,500 mg by mouth 2 (two) times daily.     . cholecalciferol (VITAMIN D) 1000 units tablet Take 1,000 Units by mouth daily.    . fludrocortisone (FLORINEF) 0.1 MG tablet Take 0.1 mg by mouth every morning.  0  . latanoprost (XALATAN) 0.005 % ophthalmic solution Place 1 drop into both eyes at bedtime.    . Multiple Vitamin (MULTIVITAMIN WITH MINERALS) TABS tablet Take 1 tablet by mouth daily.    Marland Kitchen omeprazole (PRILOSEC) 20 MG capsule Take 20 mg by mouth daily with breakfast.  2   No current facility-administered medications for this visit.     REVIEW OF SYSTEMS:  A 10+ POINT REVIEW OF SYSTEMS WAS OBTAINED including neurology, dermatology, psychiatry, cardiac, respiratory, lymph, extremities, GI, GU, Musculoskeletal, constitutional, breasts, reproductive, HEENT.  All pertinent positives are noted in the HPI.  All others are negative.    PHYSICAL EXAMINATION: ECOG PERFORMANCE STATUS: 2 - Symptomatic, <50% confined to bed  Vitals:   07/31/17 1159  BP: 129/78  Pulse: 69  Resp: 18  Temp: 98.7 F (37.1 C)  SpO2: 96%   Filed Weights   07/31/17 1159  Weight: 216 lb 14.4 oz (98.4 kg)   .Body mass index is 25.07 kg/m.  GENERAL:alert, in no acute distress and comfortable SKIN: no acute rashes, no significant lesions EYES: conjunctiva are pink and non-injected, sclera anicteric OROPHARYNX: MMM, no exudates, no oropharyngeal erythema or ulceration NECK: supple, no JVD LYMPH:  no palpable lymphadenopathy in the cervical, axillary or inguinal regions LUNGS: clear to auscultation b/l with normal respiratory effort HEART: regular rate & rhythm ABDOMEN:  normoactive bowel sounds , non tender, not distended. No palpable hepatosplenomegaly.  Extremity: no pedal edema PSYCH: alert & oriented x 3 with fluent speech NEURO: no focal motor/sensory deficits    LABORATORY DATA:  I have reviewed the data as listed  . CBC Latest Ref Rng & Units 07/31/2017 03/12/2017  05/05/2015  WBC 4.0 - 10.3 K/uL 11.4(H) 14.5(H) 14.0(H)  Hemoglobin 13.0 - 17.1 g/dL 13.2 13.1 10.3(L)  Hematocrit 38.4 - 49.9 % 39.3 39.2 30.4(L)  Platelets 140 - 400 K/uL 199 235 235  HGB 13.1  . CBC    Component Value Date/Time   WBC 11.4 (H) 07/31/2017 1140   RBC 4.32 07/31/2017 1140   RBC 4.26 07/31/2017 1140   HGB 13.2 07/31/2017 1140   HGB 13.1 03/12/2017 1355   HCT 39.3 07/31/2017 1140   PLT 199 07/31/2017 1140   PLT 235 03/12/2017 1355   MCV 91.0 07/31/2017 1140   MCH 30.6 07/31/2017 1140   MCHC 33.6 07/31/2017 1140   RDW 12.7 07/31/2017 1140   LYMPHSABS 5.7 (H) 07/31/2017 1140   MONOABS 0.5 07/31/2017 1140   EOSABS 0.2 07/31/2017 1140   BASOSABS 0.0 07/31/2017 1140     . CMP Latest Ref Rng & Units 07/31/2017 03/12/2017 05/05/2015  Glucose 70 - 99 mg/dL 97 107 121(H)  BUN 8 - 23 mg/dL 23 22 8   Creatinine 0.61 - 1.24 mg/dL 1.26(H) 1.22 1.06  Sodium 135 - 145 mmol/L 140 140 143  Potassium 3.5 - 5.1 mmol/L 4.2 4.1 3.6  Chloride 98 - 111 mmol/L 106 106 108  CO2 22 - 32 mmol/L 26 26 26   Calcium 8.9 - 10.3 mg/dL 9.3 9.3 8.3(L)  Total Protein 6.5 - 8.1 g/dL 6.4(L) 6.4 5.6(L)  Total Bilirubin 0.3 - 1.2 mg/dL 1.3(H) 0.9 1.1  Alkaline Phos 38 - 126 U/L 49 50 348(H)  AST 15 - 41 U/L 8(L) 8 37  ALT 0 - 44 U/L 11 11 81(H)   . Lab Results  Component Value Date   LDH 127 03/12/2017   Component     Latest Ref Rng & Units 03/12/2017  IgG (Immunoglobin G), Serum     700 - 1,600 mg/dL 546 (L)  IgA     61 - 437 mg/dL 59 (L)  IgM (Immunoglobulin M), Srm     15 - 143 mg/dL 32  Total Protein ELP     6.0 - 8.5 g/dL 6.1  Albumin SerPl Elph-Mcnc     2.9 - 4.4 g/dL 3.7  Alpha 1  0.0 - 0.4 g/dL 0.2  Alpha2 Glob SerPl Elph-Mcnc     0.4 - 1.0 g/dL 0.7  B-Globulin SerPl Elph-Mcnc     0.7 - 1.3 g/dL 0.9  Gamma Glob SerPl Elph-Mcnc     0.4 - 1.8 g/dL 0.6  M Protein SerPl Elph-Mcnc     Not Observed g/dL Not Observed  Globulin, Total     2.2 - 3.9 g/dL 2.4  Albumin/Glob  SerPl     0.7 - 1.7 1.6  IFE 1      Comment  Please Note (HCV):      Comment  Kappa free light chain     3.3 - 19.4 mg/L 15.7  Lamda free light chains     5.7 - 26.3 mg/L 14.4  Kappa, lamda light chain ratio     0.26 - 1.65 1.09  Retic Ct Pct     0.8 - 1.8 % 0.7 (L)  RBC.     4.20 - 5.82 MIL/uL 4.28  Retic Count, Absolute     34.8 - 93.9 K/uL 30.0 (L)  HCV Ab     0.0 - 0.9 s/co ratio <0.1  LDH     125 - 245 U/L 127     OUTSIDE LABS    RADIOGRAPHIC STUDIES: I have personally reviewed the radiological images as listed and agreed with the findings in the report. No results found.  ASSESSMENT & PLAN:  Shaft Corigliano is a 77 y.o. caucasian male with    1. Lymphocytosis - workup consistent with Monoclonal B Lymphocytosis  Labs show Lymphocytes ranging 5-6K of which about 80% <5k were clonal with CD5+ (less than needed for a diagnosis of CLL) Also PET/CT with no Lnadenopathy or hepato-splenomegaly No clinically palpable enlarged lymph nodes, splenomegaly or hepatomegaly.  LDH WNL Hep C neg  03/25/17 PET/CT revealed No hypermetabolic evidence of a lymphoproliferative process. No lymphadenopathy. Normal size spleen. No osseous hypermetabolism. Small hiatal hernia. Prominently patulous thoracic esophagus with esophageal fluid level, suggesting prominent chronic esophageal dysmotility and/or gastroesophageal reflux disease. Nonspecific circumferential wall thickening in the mid to lower thoracic esophagus, without discrete esophageal mass or hypermetabolism, differential includes reflux esophagitis, Barrett's esophagus or neoplasm. Upper endoscopic correlation may be obtained as clinically warranted. Chronic findings include: Aortic Atherosclerosis  Coronary atherosclerosis. Marked colonic diverticulosis.   PLAN:  -Discussed pt labwork today, 07/31/17; WBC decreased from 14.5k four months ago, to 11.4k today. Lymphs increased marginally from four months ago at 5.4k, to 5.7k today.  Blood chemistries are stable.  -No constitutional symptoms present at this time  - no signs of significant disease progression at this time. -Will see pt back in 6 months given the stability of lymphocyte count and the lack of any new or bothersome symptoms   2) . Patient Active Problem List   Diagnosis Date Noted  . Normocytic anemia   . Gallstone pancreatitis 04/30/2015  . Orthostatic hypotension 04/30/2015  . Pancreatitis 04/30/2015  . Acute gallstone pancreatitis   Plan -continue f/u with PCP for this.   RTC with Dr Irene Limbo in 6 months with labs   All of the patients questions were answered with apparent satisfaction. The patient knows to call the clinic with any problems, questions or concerns.  The total time spent in the appt was 20 minutes and more than 50% was on counseling and direct patient cares.   Sullivan Lone MD [place] AAHIVMS [facility] Hematology/Oncology Physician [facility]  (Office):       [phone] (Work  cell):  [phone] (Fax):           [phone]  07/31/2017 12:39 PM  I, Baldwin [place], am acting as a scribe for Dr. Irene Limbo  .I have reviewed the above documentation for accuracy and completeness, and I agree with the above. Brunetta Genera MD

## 2017-07-31 ENCOUNTER — Telehealth: Payer: Self-pay | Admitting: Hematology

## 2017-07-31 ENCOUNTER — Encounter: Payer: Self-pay | Admitting: Hematology

## 2017-07-31 ENCOUNTER — Inpatient Hospital Stay: Payer: Medicare Other | Attending: Hematology | Admitting: Hematology

## 2017-07-31 ENCOUNTER — Other Ambulatory Visit: Payer: Self-pay

## 2017-07-31 ENCOUNTER — Inpatient Hospital Stay: Payer: Medicare Other

## 2017-07-31 VITALS — BP 129/78 | HR 69 | Temp 98.7°F | Resp 18 | Ht 78.0 in | Wt 216.9 lb

## 2017-07-31 DIAGNOSIS — Z803 Family history of malignant neoplasm of breast: Secondary | ICD-10-CM | POA: Insufficient documentation

## 2017-07-31 DIAGNOSIS — I951 Orthostatic hypotension: Secondary | ICD-10-CM | POA: Insufficient documentation

## 2017-07-31 DIAGNOSIS — D72820 Lymphocytosis (symptomatic): Secondary | ICD-10-CM

## 2017-07-31 DIAGNOSIS — Z87891 Personal history of nicotine dependence: Secondary | ICD-10-CM | POA: Insufficient documentation

## 2017-07-31 DIAGNOSIS — R5383 Other fatigue: Secondary | ICD-10-CM | POA: Insufficient documentation

## 2017-07-31 DIAGNOSIS — Z8249 Family history of ischemic heart disease and other diseases of the circulatory system: Secondary | ICD-10-CM

## 2017-07-31 DIAGNOSIS — Z79899 Other long term (current) drug therapy: Secondary | ICD-10-CM | POA: Diagnosis not present

## 2017-07-31 LAB — CMP (CANCER CENTER ONLY)
ALBUMIN: 4.1 g/dL (ref 3.5–5.0)
ALT: 11 U/L (ref 0–44)
ANION GAP: 8 (ref 5–15)
AST: 8 U/L — ABNORMAL LOW (ref 15–41)
Alkaline Phosphatase: 49 U/L (ref 38–126)
BUN: 23 mg/dL (ref 8–23)
CHLORIDE: 106 mmol/L (ref 98–111)
CO2: 26 mmol/L (ref 22–32)
Calcium: 9.3 mg/dL (ref 8.9–10.3)
Creatinine: 1.26 mg/dL — ABNORMAL HIGH (ref 0.61–1.24)
GFR, Est AFR Am: >60 mL/min (ref >60)
GFR, Estimated: 54 mL/min — ABNORMAL LOW (ref >60)
GLUCOSE: 97 mg/dL (ref 70–99)
POTASSIUM: 4.2 mmol/L (ref 3.5–5.1)
SODIUM: 140 mmol/L (ref 135–145)
Total Bilirubin: 1.3 mg/dL — ABNORMAL HIGH (ref 0.3–1.2)
Total Protein: 6.4 g/dL — ABNORMAL LOW (ref 6.5–8.1)

## 2017-07-31 LAB — CBC WITH DIFFERENTIAL/PLATELET
Basophils Absolute: 0 10*3/uL (ref 0.0–0.1)
Basophils Relative: 0 %
EOS PCT: 2 %
Eosinophils Absolute: 0.2 10*3/uL (ref 0.0–0.5)
HEMATOCRIT: 39.3 % (ref 38.4–49.9)
Hemoglobin: 13.2 g/dL (ref 13.0–17.1)
LYMPHS ABS: 5.7 10*3/uL — AB (ref 0.9–3.3)
LYMPHS PCT: 50 %
MCH: 30.6 pg (ref 27.2–33.4)
MCHC: 33.6 g/dL (ref 32.0–36.0)
MCV: 91 fL (ref 79.3–98.0)
MONO ABS: 0.5 10*3/uL (ref 0.1–0.9)
MONOS PCT: 4 %
NEUTROS ABS: 5 10*3/uL (ref 1.5–6.5)
Neutrophils Relative %: 44 %
Platelets: 199 10*3/uL (ref 140–400)
RBC: 4.32 MIL/uL (ref 4.20–5.82)
RDW: 12.7 % (ref 11.0–14.6)
WBC: 11.4 10*3/uL — ABNORMAL HIGH (ref 4.0–10.3)

## 2017-07-31 LAB — RETICULOCYTES
RBC.: 4.26 MIL/uL (ref 4.20–5.82)
Retic Count, Absolute: 29.8 10*3/uL — ABNORMAL LOW (ref 34.8–93.9)
Retic Ct Pct: 0.7 % — ABNORMAL LOW (ref 0.8–1.8)

## 2017-07-31 NOTE — Telephone Encounter (Signed)
Scheduled appt per 7/31 - pt awre - per patient no print out wanted - my chart active.

## 2017-08-14 LAB — FISH,CLL PROGNOSTIC PANEL

## 2017-08-16 DIAGNOSIS — I951 Orthostatic hypotension: Secondary | ICD-10-CM | POA: Diagnosis not present

## 2017-09-19 DIAGNOSIS — Z23 Encounter for immunization: Secondary | ICD-10-CM | POA: Diagnosis not present

## 2018-01-29 NOTE — Progress Notes (Signed)
HEMATOLOGY/ONCOLOGY CLINIC NOTE  Date of Service: 01/30/2018  Patient Care Team: Lajean Manes, MD as PCP - General (Internal Medicine)  CHIEF COMPLAINTS/PURPOSE OF CONSULTATION:  F/u for monoclonal B Lymphocytosis   HISTORY OF PRESENTING ILLNESS:   Bobby [name] is a wonderful 78 y.o. male who has been referred to [place] by Dr. Lajean Manes, his PCP, for evaluation and management of lymphocytosis.  He presents to the clinic today accompanied by his wife.  He notes lately he has felt low energy and low stamina. This change is not sudden but still a significant drop in the past 6 months. About 1 year prior he started to feel more presence of fatigue. He denied and lumps or bumps and palpable lymph nodes. No enlarged lymph nodes in his last CT from 2017.   On review of symptoms, pt notes significant fatigue from doing heavy yard work and walking a mile and his back now gets tired sooner. He denies SOB. He notes stable weight. He denies fever, chills, night sweats, abdominal pain, skin rash or itching or leg swelling. He denies recent viral infection.   He has been on low dose Fludrocortisone for 2-3 years for his orthostatic hypotension. He attributes this hypotension to prior alcohol use and other issues. He cut back on tylenol which he takes for osteoarthritis. No new medication change in the past 6 months.He denies iron or other vitamin deficiency. He notes he had a tight esophagus and had it stretched 5 years ago. He had gallbladder removed in 2017. Pt notes having to chew his food very well before swallowing. He was a smoker 40 years ago. He denies chemical exposures in the past. He is up to date on his cancer screenings. He has never had a blood transfusion.    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 The patient's last visit with [place] was on 07/31/17. The pt reports that he is doing well overall and he will follow up  with Dr. Felipa Eth in 1 month due to possible arthritis symptoms. He follows up with Dr. Felipa Eth every 6 months. He has been exercising lately and he will be going to [place] soon, which he is happy for.   Lab results today (01/30/18) of CBC w/diff and CMP is as follows: all values are WNL except for WBC elevated to 11.1. CMP and LDH pending.   On review of systems, pt denies fever, chills, night sweats, unexpected weight loss, abdominal pain, leg swelling, skin rashes, and any other symptoms.    MEDICAL HISTORY:  Past Medical History:  Diagnosis Date  . Anxiety   . Diverticulosis   . Orthostatic hypotension   . Tinnitus     SURGICAL HISTORY: Past Surgical History:  Procedure Laterality Date  . BALLOON DILATION N/A 02/26/2012   Procedure: BALLOON DILATION;  Surgeon: Garlan Fair, MD;  Location: [facility];  Service: Endoscopy;  Laterality: N/A;  . CHOLECYSTECTOMY N/A 05/03/2015   Procedure: LAPAROSCOPIC CHOLECYSTECTOMY WITH INTRAOPERATIVE CHOLANGIOGRAM , LAPAROSCOPIC GASTROTOMY  ;  Surgeon: Alphonsa Overall, MD;  Location: [facility];  Service: General;  Laterality: N/A;  . ERCP N/A 05/03/2015   Procedure: ENDOSCOPIC RETROGRADE CHOLANGIOPANCREATOGRAPHY (ERCP);  Surgeon: Carol Ada, MD;  Location: [facility];  Service: Gastroenterology;  Laterality: N/A;  . ESOPHAGOGASTRODUODENOSCOPY N/A 02/26/2012   Procedure: ESOPHAGOGASTRODUODENOSCOPY (EGD);  Surgeon: Garlan Fair, MD;  Location: [facility];  Service: Endoscopy;  Laterality: N/A;  . ESOPHAGOGASTRODUODENOSCOPY (EGD) WITH PROPOFOL  N/A 05/02/2015   Procedure: ESOPHAGOGASTRODUODENOSCOPY (EGD) WITH PROPOFOL;  Surgeon: Arta Silence, MD;  Location: [facility];  Service: Endoscopy;  Laterality: N/A;  . TONSILLECTOMY      SOCIAL HISTORY: Social History   Socioeconomic History  . Marital status: Married    Spouse name: Not on file  . Number of children: Not on file  . Years of education: Not on file  . Highest education level: Not  on file  Occupational History  . Not on file  Social Needs  . Financial resource strain: Not on file  . Food insecurity:    Worry: Not on file    Inability: Not on file  . Transportation needs:    Medical: Not on file    Non-medical: Not on file  Tobacco Use  . Smoking status: Former Smoker    Last attempt to quit: 1979    Years since quitting: 41.1  . Smokeless tobacco: Never Used  Substance and Sexual Activity  . Alcohol use: Yes    Alcohol/week: 3.0 standard drinks    Types: 3 Shots of liquor per week    Comment: occasional  . Drug use: No  . Sexual activity: Not on file  Lifestyle  . Physical activity:    Days per week: Not on file    Minutes per session: Not on file  . Stress: Not on file  Relationships  . Social connections:    Talks on phone: Not on file    Gets together: Not on file    Attends religious service: Not on file    Active member of club or organization: Not on file    Attends meetings of clubs or organizations: Not on file    Relationship status: Not on file  . Intimate partner violence:    Fear of current or ex partner: Not on file    Emotionally abused: Not on file    Physically abused: Not on file    Forced sexual activity: Not on file  Other Topics Concern  . Not on file  Social History Narrative  . Not on file    FAMILY HISTORY: Family History  Problem Relation Age of Onset  . Cancer Mother   . Breast cancer Mother   . Stroke Mother   . Diabetes Neg Hx   . Hypertension Neg Hx     ALLERGIES:  has No Known Allergies.  MEDICATIONS:  Current Outpatient Medications  Medication Sig Dispense Refill  . acetaminophen (TYLENOL) 500 MG tablet Take 1,000-1,500 mg by mouth 2 (two) times daily.     . cholecalciferol (VITAMIN D) 1000 units tablet Take 1,000 Units by mouth daily.    . fludrocortisone (FLORINEF) 0.1 MG tablet Take 0.1 mg by mouth every morning.  0  . latanoprost (XALATAN) 0.005 % ophthalmic solution Place 1 drop into both eyes  at bedtime.    . Multiple Vitamin (MULTIVITAMIN WITH MINERALS) TABS tablet Take 1 tablet by mouth daily.    Marland Kitchen omeprazole (PRILOSEC) 20 MG capsule Take 20 mg by mouth daily with breakfast.  2   No current facility-administered medications for this visit.     REVIEW OF SYSTEMS:    A 10+ POINT REVIEW OF SYSTEMS WAS OBTAINED including neurology, dermatology, psychiatry, cardiac, respiratory, lymph, extremities, GI, GU, Musculoskeletal, constitutional, breasts, reproductive, HEENT.  All pertinent positives are noted in the HPI.  All others are negative.    PHYSICAL EXAMINATION: ECOG PERFORMANCE STATUS: 2 - Symptomatic, <50% confined to bed  Vitals:  01/30/18 1454  BP: 126/71  Pulse: 77  Resp: 18  Temp: 98.2 F (36.8 C)  SpO2: 99%   Filed Weights   01/30/18 1454  Weight: 214 lb 1.6 oz (97.1 kg)   .Body mass index is 29.04 kg/m.  GENERAL:alert, in no acute distress and comfortable SKIN: no acute rashes, no significant lesions EYES: conjunctiva are pink and non-injected, sclera anicteric OROPHARYNX: MMM, no exudates, no oropharyngeal erythema or ulceration NECK: supple, no JVD LYMPH:  no palpable lymphadenopathy in the cervical, axillary or inguinal regions LUNGS: clear to auscultation b/l with normal respiratory effort HEART: regular rate & rhythm ABDOMEN:  normoactive bowel sounds , non tender, not distended. No palpable hepatosplenomegaly.  Extremity: no pedal edema PSYCH: alert & oriented x 3 with fluent speech NEURO: no focal motor/sensory deficits    LABORATORY DATA:  I have reviewed the data as listed  . CBC Latest Ref Rng & Units 01/30/2018 07/31/2017 03/12/2017  WBC 4.0 - 10.5 K/uL 11.1(H) 11.4(H) 14.5(H)  Hemoglobin 13.0 - 17.0 g/dL 13.3 13.2 13.1  Hematocrit 39.0 - 52.0 % 39.4 39.3 39.2  Platelets 150 - 400 K/uL 215 199 235  HGB 13.1  . CBC    Component Value Date/Time   WBC 11.1 (H) 01/30/2018 1428   RBC 4.30 01/30/2018 1428   HGB 13.3 01/30/2018 1428    HGB 13.1 03/12/2017 1355   HCT 39.4 01/30/2018 1428   PLT 215 01/30/2018 1428   PLT 235 03/12/2017 1355   MCV 91.6 01/30/2018 1428   MCH 30.9 01/30/2018 1428   MCHC 33.8 01/30/2018 1428   RDW 11.8 01/30/2018 1428   LYMPHSABS PENDING 01/30/2018 1428   MONOABS PENDING 01/30/2018 1428   EOSABS PENDING 01/30/2018 1428   BASOSABS PENDING 01/30/2018 1428     . CMP Latest Ref Rng & Units 01/30/2018 07/31/2017 03/12/2017  Glucose 70 - 99 mg/dL 117(H) 97 107  BUN 8 - 23 mg/dL 21 23 22   Creatinine 0.61 - 1.24 mg/dL 1.21 1.26(H) 1.22  Sodium 135 - 145 mmol/L 142 140 140  Potassium 3.5 - 5.1 mmol/L 3.9 4.2 4.1  Chloride 98 - 111 mmol/L 107 106 106  CO2 22 - 32 mmol/L 25 26 26   Calcium 8.9 - 10.3 mg/dL 9.0 9.3 9.3  Total Protein 6.5 - 8.1 g/dL 6.3(L) 6.4(L) 6.4  Total Bilirubin 0.3 - 1.2 mg/dL 1.0 1.3(H) 0.9  Alkaline Phos 38 - 126 U/L 51 49 50  AST 15 - 41 U/L 8(L) 8(L) 8  ALT 0 - 44 U/L 10 11 11    . Lab Results  Component Value Date   LDH 124 01/30/2018   Component     Latest Ref Rng & Units 03/12/2017  IgG (Immunoglobin G), Serum     700 - 1,600 mg/dL 546 (L)  IgA     61 - 437 mg/dL 59 (L)  IgM (Immunoglobulin M), Srm     15 - 143 mg/dL 32  Total Protein ELP     6.0 - 8.5 g/dL 6.1  Albumin SerPl Elph-Mcnc     2.9 - 4.4 g/dL 3.7  Alpha 1     0.0 - 0.4 g/dL 0.2  Alpha2 Glob SerPl Elph-Mcnc     0.4 - 1.0 g/dL 0.7  B-Globulin SerPl Elph-Mcnc     0.7 - 1.3 g/dL 0.9  Gamma Glob SerPl Elph-Mcnc     0.4 - 1.8 g/dL 0.6  M Protein SerPl Elph-Mcnc     Not Observed g/dL Not Observed  Globulin, Total  2.2 - 3.9 g/dL 2.4  Albumin/Glob SerPl     0.7 - 1.7 1.6  IFE 1      Comment  Please Note (HCV):      Comment  Kappa free light chain     3.3 - 19.4 mg/L 15.7  Lamda free light chains     5.7 - 26.3 mg/L 14.4  Kappa, lamda light chain ratio     0.26 - 1.65 1.09  Retic Ct Pct     0.8 - 1.8 % 0.7 (L)  RBC.     4.20 - 5.82 MIL/uL 4.28  Retic Count, Absolute     34.8 -  93.9 K/uL 30.0 (L)  HCV Ab     0.0 - 0.9 s/co ratio <0.1  LDH     125 - 245 U/L 127     OUTSIDE LABS    RADIOGRAPHIC STUDIES: I have personally reviewed the radiological images as listed and agreed with the findings in the report. No results found.  ASSESSMENT & PLAN:  Jencarlos Nicolson is a 78 y.o. caucasian male with    1. Lymphocytosis - workup consistent with Monoclonal B Lymphocytosis  Labs show Lymphocytes ranging 5-6K of which about 80% <5k were clonal with CD5+ (less than needed for a diagnosis of CLL) Also PET/CT with no Lnadenopathy or hepato-splenomegaly No clinically palpable enlarged lymph nodes, splenomegaly or hepatomegaly.  LDH WNL Hep C neg  03/25/17 PET/CT revealed No hypermetabolic evidence of a lymphoproliferative process. No lymphadenopathy. Normal size spleen. No osseous hypermetabolism. Small hiatal hernia. Prominently patulous thoracic esophagus with esophageal fluid level, suggesting prominent chronic esophageal dysmotility and/or gastroesophageal reflux disease. Nonspecific circumferential wall thickening in the mid to lower thoracic esophagus, without discrete esophageal mass or hypermetabolism, differential includes reflux esophagitis, Barrett's esophagus or neoplasm. Upper endoscopic correlation may be obtained as clinically warranted. Chronic findings include: Aortic Atherosclerosis  Coronary atherosclerosis. Marked colonic diverticulosis.   PLAN:  -Discussed pt labwork today, 01/30/18; WBC elevated to 11.1. CMP and LDH wnl -Discussed that we will continue to obtain labs to have a trend, to ensure there isn't any worsening monoclonal B lymphocytosis that we will need to follow up with treatment.  -Follow up on a yearly basis or sooner if labs are elevated. Patient advised to follow up sooner should he have any symptoms that are worsening.   2) . Patient Active Problem List   Diagnosis Date Noted  . Normocytic anemia   . Gallstone pancreatitis 04/30/2015   . Orthostatic hypotension 04/30/2015  . Pancreatitis 04/30/2015  . Acute gallstone pancreatitis   Plan -continue f/u with PCP for this.    All of the patients questions were answered with apparent satisfaction. The patient knows to call the clinic with any problems, questions or concerns.  The total time spent in the appt was 20 minutes and more than 50% was on counseling and direct patient cares.     Sullivan Lone MD MS AAHIVMS [facility] Hematology/Oncology Physician [facility]  (Office):       [phone] (Work cell):  [phone] (Fax):           [phone]  01/30/2018 3:00 PM  I, Soijett Blue am acting as scribe for Dr. Sullivan Lone.  .I have reviewed the above documentation for accuracy and completeness, and I agree with the above. Brunetta Genera MD

## 2018-01-30 ENCOUNTER — Telehealth: Payer: Self-pay | Admitting: Hematology

## 2018-01-30 ENCOUNTER — Inpatient Hospital Stay: Payer: Medicare Other | Attending: Hematology | Admitting: Hematology

## 2018-01-30 ENCOUNTER — Inpatient Hospital Stay: Payer: Medicare Other

## 2018-01-30 VITALS — BP 126/71 | HR 77 | Temp 98.2°F | Resp 18 | Ht 72.0 in | Wt 214.1 lb

## 2018-01-30 DIAGNOSIS — D72820 Lymphocytosis (symptomatic): Secondary | ICD-10-CM

## 2018-01-30 LAB — CBC WITH DIFFERENTIAL/PLATELET
ABS IMMATURE GRANULOCYTES: 0.02 10*3/uL (ref 0.00–0.07)
BASOS PCT: 0 %
Basophils Absolute: 0 10*3/uL (ref 0.0–0.1)
Eosinophils Absolute: 0.2 10*3/uL (ref 0.0–0.5)
Eosinophils Relative: 2 %
HCT: 39.4 % (ref 39.0–52.0)
HEMOGLOBIN: 13.3 g/dL (ref 13.0–17.0)
Immature Granulocytes: 0 %
LYMPHS ABS: 4.8 10*3/uL — AB (ref 0.7–4.0)
Lymphocytes Relative: 43 %
MCH: 30.9 pg (ref 26.0–34.0)
MCHC: 33.8 g/dL (ref 30.0–36.0)
MCV: 91.6 fL (ref 80.0–100.0)
MONO ABS: 1.2 10*3/uL — AB (ref 0.1–1.0)
MONOS PCT: 11 %
NEUTROS ABS: 4.9 10*3/uL (ref 1.7–7.7)
NEUTROS PCT: 44 %
PLATELETS: 215 10*3/uL (ref 150–400)
RBC: 4.3 MIL/uL (ref 4.22–5.81)
RDW: 11.8 % (ref 11.5–15.5)
WBC: 11.1 10*3/uL — ABNORMAL HIGH (ref 4.0–10.5)
nRBC: 0 % (ref 0.0–0.2)

## 2018-01-30 LAB — CMP (CANCER CENTER ONLY)
ALBUMIN: 4 g/dL (ref 3.5–5.0)
ALT: 10 U/L (ref 0–44)
AST: 8 U/L — ABNORMAL LOW (ref 15–41)
Alkaline Phosphatase: 51 U/L (ref 38–126)
Anion gap: 10 (ref 5–15)
BUN: 21 mg/dL (ref 8–23)
CO2: 25 mmol/L (ref 22–32)
Calcium: 9 mg/dL (ref 8.9–10.3)
Chloride: 107 mmol/L (ref 98–111)
Creatinine: 1.21 mg/dL (ref 0.61–1.24)
GFR, Est AFR Am: >60 mL/min (ref >60)
GFR, Estimated: 58 mL/min — ABNORMAL LOW (ref >60)
GLUCOSE: 117 mg/dL — AB (ref 70–99)
POTASSIUM: 3.9 mmol/L (ref 3.5–5.1)
Sodium: 142 mmol/L (ref 135–145)
TOTAL PROTEIN: 6.3 g/dL — AB (ref 6.5–8.1)
Total Bilirubin: 1 mg/dL (ref 0.3–1.2)

## 2018-01-30 LAB — LACTATE DEHYDROGENASE: LDH: 124 U/L (ref 98–192)

## 2018-01-30 NOTE — Telephone Encounter (Signed)
Scheduled appt per 01/30 los. ° °Printed calendar and avs. °

## 2018-01-30 NOTE — Patient Instructions (Signed)
Thank you for choosing [facility] to provide your oncology and hematology care.  To afford each patient quality time with our providers, please arrive 30 minutes before your scheduled appointment time.  If you arrive late for your appointment, you may be asked to reschedule.  We strive to give you quality time with our providers, and arriving late affects you and other patients whose appointments are after yours.    If you are a no show for multiple scheduled visits, you may be dismissed from the clinic at the providers discretion.     Again, thank you for choosing [facility], our hope is that these requests will decrease the amount of time that you wait before being seen by our physicians.  ______________________________________________________________________   Should you have questions after your visit to the [facility], please contact our office at [phone] between the hours of 8:30 and 4:30 p.m.    Voicemails left after 4:30p.m will not be returned until the following business day.     For prescription refill requests, please have your pharmacy contact us directly.  Please also try to allow 48 hours for prescription requests.     Please contact the scheduling department for questions regarding scheduling.  For scheduling of procedures such as PET scans, CT scans, MRI, Ultrasound, etc please contact central scheduling at [phone].     Resources For Cancer Patients and Caregivers:    Oncolink.org:  A wonderful resource for patients and healthcare providers for information regarding your disease, ways to tract your treatment, what to expect, etc.      American Cancer Society:  800-227-2345  Can help patients locate various types of support and financial assistance   Cancer Care: 1-800-813-HOPE (4673) Provides financial assistance, online support groups, medication/co-pay assistance.     [place] DSS:  336-641-3447 Where to apply  for food stamps, Medicaid, and utility assistance   Medicare Rights Center: 800-333-4114 Helps people with Medicare understand their rights and benefits, navigate the Medicare system, and secure the quality healthcare they deserve   SCAT: 336-333-6589 [employer]'s shared-ride transportation service for eligible riders who have a disability that prevents them from riding the fixed route bus.     For additional information on assistance programs please contact our social worker:   Bobby [name]:  [phone]  

## 2018-02-24 DIAGNOSIS — N183 Chronic kidney disease, stage 3 (moderate): Secondary | ICD-10-CM | POA: Diagnosis not present

## 2018-02-24 DIAGNOSIS — M25532 Pain in left wrist: Secondary | ICD-10-CM | POA: Diagnosis not present

## 2018-02-24 DIAGNOSIS — Z0000 Encounter for general adult medical examination without abnormal findings: Secondary | ICD-10-CM | POA: Diagnosis not present

## 2018-02-24 DIAGNOSIS — Z1389 Encounter for screening for other disorder: Secondary | ICD-10-CM | POA: Diagnosis not present

## 2018-02-24 DIAGNOSIS — I951 Orthostatic hypotension: Secondary | ICD-10-CM | POA: Diagnosis not present

## 2018-02-24 DIAGNOSIS — D72820 Lymphocytosis (symptomatic): Secondary | ICD-10-CM | POA: Diagnosis not present

## 2018-05-06 DIAGNOSIS — L738 Other specified follicular disorders: Secondary | ICD-10-CM | POA: Diagnosis not present

## 2018-05-06 DIAGNOSIS — Z85828 Personal history of other malignant neoplasm of skin: Secondary | ICD-10-CM | POA: Diagnosis not present

## 2018-05-06 DIAGNOSIS — D1801 Hemangioma of skin and subcutaneous tissue: Secondary | ICD-10-CM | POA: Diagnosis not present

## 2018-05-06 DIAGNOSIS — L821 Other seborrheic keratosis: Secondary | ICD-10-CM | POA: Diagnosis not present

## 2018-05-06 DIAGNOSIS — D2261 Melanocytic nevi of right upper limb, including shoulder: Secondary | ICD-10-CM | POA: Diagnosis not present

## 2018-05-06 DIAGNOSIS — L723 Sebaceous cyst: Secondary | ICD-10-CM | POA: Diagnosis not present

## 2018-08-25 DIAGNOSIS — N401 Enlarged prostate with lower urinary tract symptoms: Secondary | ICD-10-CM | POA: Diagnosis not present

## 2018-08-25 DIAGNOSIS — I951 Orthostatic hypotension: Secondary | ICD-10-CM | POA: Diagnosis not present

## 2018-08-25 DIAGNOSIS — N183 Chronic kidney disease, stage 3 (moderate): Secondary | ICD-10-CM | POA: Diagnosis not present

## 2018-08-25 DIAGNOSIS — R351 Nocturia: Secondary | ICD-10-CM | POA: Diagnosis not present

## 2018-10-01 DIAGNOSIS — Z23 Encounter for immunization: Secondary | ICD-10-CM | POA: Diagnosis not present

## 2018-10-14 DIAGNOSIS — H401131 Primary open-angle glaucoma, bilateral, mild stage: Secondary | ICD-10-CM | POA: Diagnosis not present

## 2018-10-14 DIAGNOSIS — H2513 Age-related nuclear cataract, bilateral: Secondary | ICD-10-CM | POA: Diagnosis not present

## 2018-11-20 ENCOUNTER — Ambulatory Visit (INDEPENDENT_AMBULATORY_CARE_PROVIDER_SITE_OTHER): Payer: Medicare Other

## 2018-11-20 ENCOUNTER — Ambulatory Visit (INDEPENDENT_AMBULATORY_CARE_PROVIDER_SITE_OTHER): Payer: Medicare Other | Admitting: Podiatry

## 2018-11-20 ENCOUNTER — Other Ambulatory Visit: Payer: Self-pay

## 2018-11-20 ENCOUNTER — Encounter: Payer: Self-pay | Admitting: Podiatry

## 2018-11-20 DIAGNOSIS — M779 Enthesopathy, unspecified: Secondary | ICD-10-CM

## 2018-11-20 DIAGNOSIS — M79672 Pain in left foot: Secondary | ICD-10-CM | POA: Diagnosis not present

## 2018-11-20 DIAGNOSIS — Q828 Other specified congenital malformations of skin: Secondary | ICD-10-CM

## 2018-11-20 DIAGNOSIS — M216X2 Other acquired deformities of left foot: Secondary | ICD-10-CM

## 2018-11-25 NOTE — Progress Notes (Signed)
Subjective:   Patient ID: Bobby [name], male   DOB: 78 y.o.   MRN: [id number]   HPI 78 year old male presents the office today for concerns of a callus on his left foot pointing to the fifth metatarsal head plantarly.  He states that he has recently changed his shoes as he is wearing his shoe too small he realized.  He states he will occasionally trim it himself is becoming large.  It feels better with wearing shoes it hurts more with going barefoot.  Denies any open sores or redness or any drainage.   Review of Systems  All other systems reviewed and are negative.  Past Medical History:  Diagnosis Date  . Anxiety   . Diverticulosis   . Orthostatic hypotension   . Tinnitus     Past Surgical History:  Procedure Laterality Date  . BALLOON DILATION N/A 02/26/2012   Procedure: BALLOON DILATION;  Surgeon: Garlan Fair, MD;  Location: [facility];  Service: Endoscopy;  Laterality: N/A;  . CHOLECYSTECTOMY N/A 05/03/2015   Procedure: LAPAROSCOPIC CHOLECYSTECTOMY WITH INTRAOPERATIVE CHOLANGIOGRAM , LAPAROSCOPIC GASTROTOMY  ;  Surgeon: Alphonsa Overall, MD;  Location: [facility];  Service: General;  Laterality: N/A;  . ERCP N/A 05/03/2015   Procedure: ENDOSCOPIC RETROGRADE CHOLANGIOPANCREATOGRAPHY (ERCP);  Surgeon: Carol Ada, MD;  Location: [facility];  Service: Gastroenterology;  Laterality: N/A;  . ESOPHAGOGASTRODUODENOSCOPY N/A 02/26/2012   Procedure: ESOPHAGOGASTRODUODENOSCOPY (EGD);  Surgeon: Garlan Fair, MD;  Location: [facility];  Service: Endoscopy;  Laterality: N/A;  . ESOPHAGOGASTRODUODENOSCOPY (EGD) WITH PROPOFOL N/A 05/02/2015   Procedure: ESOPHAGOGASTRODUODENOSCOPY (EGD) WITH PROPOFOL;  Surgeon: Arta Silence, MD;  Location: [facility];  Service: Endoscopy;  Laterality: N/A;  . TONSILLECTOMY       Current Outpatient Medications:  .  acetaminophen (TYLENOL) 500 MG tablet, Take 1,000-1,500 mg by mouth 2 (two) times daily. , Disp: , Rfl:  .  cholecalciferol (VITAMIN D) 1000 units  tablet, Take 1,000 Units by mouth daily., Disp: , Rfl:  .  fludrocortisone (FLORINEF) 0.1 MG tablet, Take 0.1 mg by mouth every morning., Disp: , Rfl: 0 .  latanoprost (XALATAN) 0.005 % ophthalmic solution, Place 1 drop into both eyes at bedtime., Disp: , Rfl:  .  Multiple Vitamin (MULTIVITAMIN WITH MINERALS) TABS tablet, Take 1 tablet by mouth daily., Disp: , Rfl:  .  omeprazole (PRILOSEC) 20 MG capsule, Take 20 mg by mouth daily with breakfast., Disp: , Rfl: 2  No Known Allergies       Objective:  Physical Exam  General: AAO x3, NAD  Dermatological: Thick hyperkeratotic tissue left foot submetatarsal 5.  Upon debridement there is no underlying ulceration, drainage or any signs of infection.  No open lesions. Vascular: Dorsalis Pedis artery and Posterior Tibial artery pedal pulses are 2/4 bilateral with immedate capillary fill time. Pedal hair growth present. No varicosities and no lower extremity edema present bilateral. There is no pain with calf compression, swelling, warmth, erythema.   Neruologic: Grossly intact via light touch bilateral. Vibratory intact via tuning fork bilateral. Patellar and Achilles deep tendon reflexes 2+ bilateral. No Babinski or clonus noted bilateral.   Musculoskeletal: Prominent metatarsal heads plantarly with atrophy of the fat pad.  No gross boney pedal deformities bilateral. No pain, crepitus, or limitation noted with foot and ankle range of motion bilateral. Muscular strength 5/5 in all groups tested bilateral.  Gait: Unassisted, Nonantalgic.       Assessment:   Porokeratosis left foot     Plan:  -Treatment options discussed including  all alternatives, risks, and complications -Etiology of symptoms were discussed -X-rays obtained reviewed.  No evidence of acute fracture, foreign body.  No significant bone spur present. -With the lesion debrided without any complications of bleeding x1.  Recommend moisturizer daily as well as offloading.   Dispensed offloading pads.  If symptoms continue we discussed getting a custom insert to help offload.  Trula Slade DPM

## 2019-01-12 DIAGNOSIS — L738 Other specified follicular disorders: Secondary | ICD-10-CM | POA: Diagnosis not present

## 2019-01-12 DIAGNOSIS — Z85828 Personal history of other malignant neoplasm of skin: Secondary | ICD-10-CM | POA: Diagnosis not present

## 2019-01-12 DIAGNOSIS — C44319 Basal cell carcinoma of skin of other parts of face: Secondary | ICD-10-CM | POA: Diagnosis not present

## 2019-01-12 DIAGNOSIS — D485 Neoplasm of uncertain behavior of skin: Secondary | ICD-10-CM | POA: Diagnosis not present

## 2019-01-29 NOTE — Progress Notes (Signed)
HEMATOLOGY/ONCOLOGY CLINIC NOTE  Date of Service: 01/30/2019  Patient Care Team: Lajean Manes, MD as PCP - General (Internal Medicine)  CHIEF COMPLAINTS/PURPOSE OF CONSULTATION:  F/u for monoclonal B Lymphocytosis   HISTORY OF PRESENTING ILLNESS:   Bobby [name] is a wonderful 79 y.o. male who has been referred to [place] by Dr. Lajean Manes, his PCP, for evaluation and management of lymphocytosis.  He presents to the clinic today accompanied by his wife.  He notes lately he has felt low energy and low stamina. This change is not sudden but still a significant drop in the past 6 months. About 1 year prior he started to feel more presence of fatigue. He denied and lumps or bumps and palpable lymph nodes. No enlarged lymph nodes in his last CT from 2017.   On review of symptoms, pt notes significant fatigue from doing heavy yard work and walking a mile and his back now gets tired sooner. He denies SOB. He notes stable weight. He denies fever, chills, night sweats, abdominal pain, skin rash or itching or leg swelling. He denies recent viral infection.   He has been on low dose Fludrocortisone for 2-3 years for his orthostatic hypotension. He attributes this hypotension to prior alcohol use and other issues. He cut back on tylenol which he takes for osteoarthritis. No new medication change in the past 6 months.He denies iron or other vitamin deficiency. He notes he had a tight esophagus and had it stretched 5 years ago. He had gallbladder removed in 2017. Pt notes having to chew his food very well before swallowing. He was a smoker 40 years ago. He denies chemical exposures in the past. He is up to date on his cancer screenings. He has never had a blood transfusion.    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 The patient's last visit with [place] was on 01/30/2018. The pt reports that he is doing well overall.  The pt reports  he has recently moved.  He has felt pretty good over the past year.  He has skin cancer that is being treated by dermatology.  Lab results today (01/30/19) of CBC w/diff and CMP is as follows: all values are WNL except for WBC at 11.5, HCT at 38.8, Lymphs Abs at 6.1, Glucose Bld at 101, BUN at 26, Creatinine at 1.28, Total protein at 6.4, Total Bilirubin at 1.9, GFR Est Non Af Am at 54, LDH 152  On review of systems, pt reports doing well, slower urination and denies fevers, chills, night sweats, new lumps or bumps, mouth sores, abdominal pain, changes in bowels, leg swelling and any other symptoms.    MEDICAL HISTORY:  Past Medical History:  Diagnosis Date  . Anxiety   . Diverticulosis   . Orthostatic hypotension   . Tinnitus     SURGICAL HISTORY: Past Surgical History:  Procedure Laterality Date  . BALLOON DILATION N/A 02/26/2012   Procedure: BALLOON DILATION;  Surgeon: Garlan Fair, MD;  Location: [facility];  Service: Endoscopy;  Laterality: N/A;  . CHOLECYSTECTOMY N/A 05/03/2015   Procedure: LAPAROSCOPIC CHOLECYSTECTOMY WITH INTRAOPERATIVE CHOLANGIOGRAM , LAPAROSCOPIC GASTROTOMY  ;  Surgeon: Alphonsa Overall, MD;  Location: [facility];  Service: General;  Laterality: N/A;  . ERCP N/A 05/03/2015   Procedure: ENDOSCOPIC RETROGRADE CHOLANGIOPANCREATOGRAPHY (ERCP);  Surgeon: Carol Ada, MD;  Location: [facility];  Service: Gastroenterology;  Laterality: N/A;  . ESOPHAGOGASTRODUODENOSCOPY N/A 02/26/2012   Procedure: ESOPHAGOGASTRODUODENOSCOPY (EGD);  Surgeon: Garlan Fair, MD;  Location: [facility];  Service: Endoscopy;  Laterality: N/A;  . ESOPHAGOGASTRODUODENOSCOPY (EGD) WITH PROPOFOL N/A 05/02/2015   Procedure: ESOPHAGOGASTRODUODENOSCOPY (EGD) WITH PROPOFOL;  Surgeon: Arta Silence, MD;  Location: [facility];  Service: Endoscopy;  Laterality: N/A;  . TONSILLECTOMY      SOCIAL HISTORY: Social History   Socioeconomic History  . Marital status: Married    Spouse name: Not on  file  . Number of children: Not on file  . Years of education: Not on file  . Highest education level: Not on file  Occupational History  . Not on file  Tobacco Use  . Smoking status: Former Smoker    Quit date: 1979    Years since quitting: 42.1  . Smokeless tobacco: Never Used  Substance and Sexual Activity  . Alcohol use: Yes    Alcohol/week: 3.0 standard drinks    Types: 3 Shots of liquor per week    Comment: occasional  . Drug use: No  . Sexual activity: Not on file  Other Topics Concern  . Not on file  Social History Narrative  . Not on file   Social Determinants of Health   Financial Resource Strain:   . Difficulty of Paying Living Expenses: Not on file  Food Insecurity:   . Worried About Charity fundraiser in the Last Year: Not on file  . Ran Out of Food in the Last Year: Not on file  Transportation Needs:   . Lack of Transportation (Medical): Not on file  . Lack of Transportation (Non-Medical): Not on file  Physical Activity:   . Days of Exercise per Week: Not on file  . Minutes of Exercise per Session: Not on file  Stress:   . Feeling of Stress : Not on file  Social Connections:   . Frequency of Communication with Friends and Family: Not on file  . Frequency of Social Gatherings with Friends and Family: Not on file  . Attends Religious Services: Not on file  . Active Member of Clubs or Organizations: Not on file  . Attends Archivist Meetings: Not on file  . Marital Status: Not on file  Intimate Partner Violence:   . Fear of Current or Ex-Partner: Not on file  . Emotionally Abused: Not on file  . Physically Abused: Not on file  . Sexually Abused: Not on file    FAMILY HISTORY: Family History  Problem Relation Age of Onset  . Cancer Mother   . Breast cancer Mother   . Stroke Mother   . Diabetes Neg Hx   . Hypertension Neg Hx     ALLERGIES:  has No Known Allergies.  MEDICATIONS:  Current Outpatient Medications  Medication Sig  Dispense Refill  . acetaminophen (TYLENOL) 500 MG tablet Take 1,000-1,500 mg by mouth 2 (two) times daily.     . cholecalciferol (VITAMIN D) 1000 units tablet Take 1,000 Units by mouth daily.    . fludrocortisone (FLORINEF) 0.1 MG tablet Take 0.1 mg by mouth every morning.  0  . latanoprost (XALATAN) 0.005 % ophthalmic solution Place 1 drop into both eyes at bedtime.    . Multiple Vitamin (MULTIVITAMIN WITH MINERALS) TABS tablet Take 1 tablet by mouth daily.    Marland Kitchen omeprazole (PRILOSEC) 20 MG capsule Take 20 mg by mouth daily with breakfast.  2   No current facility-administered medications for this visit.    REVIEW OF SYSTEMS:   A 10+ POINT REVIEW OF SYSTEMS WAS OBTAINED  including neurology, dermatology, psychiatry, cardiac, respiratory, lymph, extremities, GI, GU, Musculoskeletal, constitutional, breasts, reproductive, HEENT.  All pertinent positives are noted in the HPI.  All others are negative.     PHYSICAL EXAMINATION: ECOG FS:1 - Symptomatic but completely ambulatory  Vitals:   01/30/19 1142  BP: (!) 146/76  Pulse: 70  Resp: 18  Temp: 97.9 F (36.6 C)  SpO2: 99%   Wt Readings from Last 3 Encounters:  01/30/19 204 lb (92.5 kg)  01/30/18 214 lb 1.6 oz (97.1 kg)  07/31/17 216 lb 14.4 oz (98.4 kg)   Body mass index is 27.67 kg/m.    GENERAL:alert, in no acute distress and comfortable SKIN: no acute rashes, no significant lesions EYES: conjunctiva are pink and non-injected, sclera anicteric OROPHARYNX: MMM, no exudates, no oropharyngeal erythema or ulceration NECK: supple, no JVD LYMPH:  no palpable lymphadenopathy in the cervical, axillary or inguinal regions LUNGS: clear to auscultation b/l with normal respiratory effort HEART: regular rate & rhythm ABDOMEN:  normoactive bowel sounds , non tender, not distended. Extremity: no pedal edema PSYCH: alert & oriented x 3 with fluent speech NEURO: no focal motor/sensory deficits   LABORATORY DATA:  I have reviewed the  data as listed  . CBC Latest Ref Rng & Units 01/30/2019 01/30/2018 07/31/2017  WBC 4.0 - 10.5 K/uL 11.5(H) 11.1(H) 11.4(H)  Hemoglobin 13.0 - 17.0 g/dL 13.0 13.3 13.2  Hematocrit 39.0 - 52.0 % 38.8(L) 39.4 39.3  Platelets 150 - 400 K/uL 231 215 199  HGB 13.1  . CBC    Component Value Date/Time   WBC 11.5 (H) 01/30/2019 1108   RBC 4.23 01/30/2019 1108   HGB 13.0 01/30/2019 1108   HGB 13.1 03/12/2017 1355   HCT 38.8 (L) 01/30/2019 1108   PLT 231 01/30/2019 1108   PLT 235 03/12/2017 1355   MCV 91.7 01/30/2019 1108   MCH 30.7 01/30/2019 1108   MCHC 33.5 01/30/2019 1108   RDW 11.9 01/30/2019 1108   LYMPHSABS 6.1 (H) 01/30/2019 1108   MONOABS 0.9 01/30/2019 1108   EOSABS 0.1 01/30/2019 1108   BASOSABS 0.1 01/30/2019 1108     . CMP Latest Ref Rng & Units 01/30/2019 01/30/2018 07/31/2017  Glucose 70 - 99 mg/dL 101(H) 117(H) 97  BUN 8 - 23 mg/dL 26(H) 21 23  Creatinine 0.61 - 1.24 mg/dL 1.28(H) 1.21 1.26(H)  Sodium 135 - 145 mmol/L 139 142 140  Potassium 3.5 - 5.1 mmol/L 4.2 3.9 4.2  Chloride 98 - 111 mmol/L 105 107 106  CO2 22 - 32 mmol/L 25 25 26   Calcium 8.9 - 10.3 mg/dL 8.9 9.0 9.3  Total Protein 6.5 - 8.1 g/dL 6.4(L) 6.3(L) 6.4(L)  Total Bilirubin 0.3 - 1.2 mg/dL 1.9(H) 1.0 1.3(H)  Alkaline Phos 38 - 126 U/L 48 51 49  AST 15 - 41 U/L 15 8(L) 8(L)  ALT 0 - 44 U/L 13 10 11    . Lab Results  Component Value Date   LDH 152 01/30/2019   Component     Latest Ref Rng & Units 03/12/2017  IgG (Immunoglobin G), Serum     700 - 1,600 mg/dL 546 (L)  IgA     61 - 437 mg/dL 59 (L)  IgM (Immunoglobulin M), Srm     15 - 143 mg/dL 32  Total Protein ELP     6.0 - 8.5 g/dL 6.1  Albumin SerPl Elph-Mcnc     2.9 - 4.4 g/dL 3.7  Alpha 1     0.0 - 0.4  g/dL 0.2  Alpha2 Glob SerPl Elph-Mcnc     0.4 - 1.0 g/dL 0.7  B-Globulin SerPl Elph-Mcnc     0.7 - 1.3 g/dL 0.9  Gamma Glob SerPl Elph-Mcnc     0.4 - 1.8 g/dL 0.6  M Protein SerPl Elph-Mcnc     Not Observed g/dL Not Observed   Globulin, Total     2.2 - 3.9 g/dL 2.4  Albumin/Glob SerPl     0.7 - 1.7 1.6  IFE 1      Comment  Please Note (HCV):      Comment  Kappa free light chain     3.3 - 19.4 mg/L 15.7  Lamda free light chains     5.7 - 26.3 mg/L 14.4  Kappa, lamda light chain ratio     0.26 - 1.65 1.09  Retic Ct Pct     0.8 - 1.8 % 0.7 (L)  RBC.     4.20 - 5.82 MIL/uL 4.28  Retic Count, Absolute     34.8 - 93.9 K/uL 30.0 (L)  HCV Ab     0.0 - 0.9 s/co ratio <0.1  LDH     125 - 245 U/L 127     OUTSIDE LABS    RADIOGRAPHIC STUDIES: I have personally reviewed the radiological images as listed and agreed with the findings in the report. No results found.  ASSESSMENT & PLAN:  Khamoni Deutscher is a 79 y.o. caucasian male with    1. Lymphocytosis - workup consistent with Monoclonal B Lymphocytosis  Labs show Lymphocytes ranging 5-6K of which about 80% <5k were clonal with CD5+ (less than needed for a diagnosis of CLL) Also PET/CT with no Lnadenopathy or hepato-splenomegaly No clinically palpable enlarged lymph nodes, splenomegaly or hepatomegaly.  LDH WNL Hep C neg  03/25/17 PET/CT revealed No hypermetabolic evidence of a lymphoproliferative process. No lymphadenopathy. Normal size spleen. No osseous hypermetabolism. Small hiatal hernia. Prominently patulous thoracic esophagus with esophageal fluid level, suggesting prominent chronic esophageal dysmotility and/or gastroesophageal reflux disease. Nonspecific circumferential wall thickening in the mid to lower thoracic esophagus, without discrete esophageal mass or hypermetabolism, differential includes reflux esophagitis, Barrett's esophagus or neoplasm. Upper endoscopic correlation may be obtained as clinically warranted. Chronic findings include: Aortic Atherosclerosis  Coronary atherosclerosis. Marked colonic diverticulosis.   2) . Patient Active Problem List   Diagnosis Date Noted  . Normocytic anemia   . Gallstone pancreatitis 04/30/2015   . Orthostatic hypotension 04/30/2015  . Pancreatitis 04/30/2015  . Acute gallstone pancreatitis     PLAN: -Discussed pt labwork today, 01/30/19; all values are WNL except for WBC at 11.5, HCT at 38.8, Lymphs Abs at 6.1, Glucose Bld at 101, BUN at 26, Creatinine at 1.28, Total protein at 6.4, Total Bilirubin at 1.9, GFR Est Non Af Am at 54, LDH WNL  . -Discussed that blood counts and blood chemistires remain steady. 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  All of the patient's questions were answered with apparent satisfaction. The patient knows to call the clinic with any  problems, questions or concerns.     Sullivan Lone MD MS AAHIVMS [facility] Hematology/Oncology Physician [facility]  (Office):       [phone] (Work cell):  [phone] (Fax):           [phone]  01/29/2019 10:29 PM  I, Scot Dock, am acting as a scribe for Dr. Sullivan Lone.   .I have reviewed the above documentation for accuracy and completeness, and I agree with the above. Brunetta Genera MD

## 2019-01-30 ENCOUNTER — Inpatient Hospital Stay: Payer: Medicare Other | Attending: Hematology

## 2019-01-30 ENCOUNTER — Inpatient Hospital Stay (HOSPITAL_BASED_OUTPATIENT_CLINIC_OR_DEPARTMENT_OTHER): Payer: Medicare Other | Admitting: Hematology

## 2019-01-30 ENCOUNTER — Other Ambulatory Visit: Payer: Self-pay

## 2019-01-30 VITALS — BP 146/76 | HR 70 | Temp 97.9°F | Resp 18 | Ht 72.0 in | Wt 204.0 lb

## 2019-01-30 DIAGNOSIS — Z79899 Other long term (current) drug therapy: Secondary | ICD-10-CM | POA: Insufficient documentation

## 2019-01-30 DIAGNOSIS — C4490 Unspecified malignant neoplasm of skin, unspecified: Secondary | ICD-10-CM | POA: Diagnosis not present

## 2019-01-30 DIAGNOSIS — K851 Biliary acute pancreatitis without necrosis or infection: Secondary | ICD-10-CM | POA: Insufficient documentation

## 2019-01-30 DIAGNOSIS — M1990 Unspecified osteoarthritis, unspecified site: Secondary | ICD-10-CM | POA: Insufficient documentation

## 2019-01-30 DIAGNOSIS — Z809 Family history of malignant neoplasm, unspecified: Secondary | ICD-10-CM | POA: Diagnosis not present

## 2019-01-30 DIAGNOSIS — Z823 Family history of stroke: Secondary | ICD-10-CM | POA: Diagnosis not present

## 2019-01-30 DIAGNOSIS — D72820 Lymphocytosis (symptomatic): Secondary | ICD-10-CM | POA: Diagnosis not present

## 2019-01-30 DIAGNOSIS — I951 Orthostatic hypotension: Secondary | ICD-10-CM | POA: Insufficient documentation

## 2019-01-30 DIAGNOSIS — R5383 Other fatigue: Secondary | ICD-10-CM | POA: Diagnosis not present

## 2019-01-30 DIAGNOSIS — D649 Anemia, unspecified: Secondary | ICD-10-CM | POA: Diagnosis not present

## 2019-01-30 DIAGNOSIS — Z803 Family history of malignant neoplasm of breast: Secondary | ICD-10-CM | POA: Insufficient documentation

## 2019-01-30 DIAGNOSIS — Z87891 Personal history of nicotine dependence: Secondary | ICD-10-CM | POA: Insufficient documentation

## 2019-01-30 DIAGNOSIS — Z7289 Other problems related to lifestyle: Secondary | ICD-10-CM | POA: Insufficient documentation

## 2019-01-30 LAB — CBC WITH DIFFERENTIAL/PLATELET
Abs Immature Granulocytes: 0.02 10*3/uL (ref 0.00–0.07)
Basophils Absolute: 0.1 10*3/uL (ref 0.0–0.1)
Basophils Relative: 0 %
Eosinophils Absolute: 0.1 10*3/uL (ref 0.0–0.5)
Eosinophils Relative: 1 %
HCT: 38.8 % — ABNORMAL LOW (ref 39.0–52.0)
Hemoglobin: 13 g/dL (ref 13.0–17.0)
Immature Granulocytes: 0 %
Lymphocytes Relative: 53 %
Lymphs Abs: 6.1 10*3/uL — ABNORMAL HIGH (ref 0.7–4.0)
MCH: 30.7 pg (ref 26.0–34.0)
MCHC: 33.5 g/dL (ref 30.0–36.0)
MCV: 91.7 fL (ref 80.0–100.0)
Monocytes Absolute: 0.9 10*3/uL (ref 0.1–1.0)
Monocytes Relative: 8 %
Neutro Abs: 4.3 10*3/uL (ref 1.7–7.7)
Neutrophils Relative %: 38 %
Platelets: 231 10*3/uL (ref 150–400)
RBC: 4.23 MIL/uL (ref 4.22–5.81)
RDW: 11.9 % (ref 11.5–15.5)
WBC: 11.5 10*3/uL — ABNORMAL HIGH (ref 4.0–10.5)
nRBC: 0 % (ref 0.0–0.2)

## 2019-01-30 LAB — CMP (CANCER CENTER ONLY)
ALT: 13 U/L (ref 0–44)
AST: 15 U/L (ref 15–41)
Albumin: 4.3 g/dL (ref 3.5–5.0)
Alkaline Phosphatase: 48 U/L (ref 38–126)
Anion gap: 9 (ref 5–15)
BUN: 26 mg/dL — ABNORMAL HIGH (ref 8–23)
CO2: 25 mmol/L (ref 22–32)
Calcium: 8.9 mg/dL (ref 8.9–10.3)
Chloride: 105 mmol/L (ref 98–111)
Creatinine: 1.28 mg/dL — ABNORMAL HIGH (ref 0.61–1.24)
GFR, Est AFR Am: >60 mL/min (ref >60)
GFR, Estimated: 54 mL/min — ABNORMAL LOW (ref >60)
Glucose, Bld: 101 mg/dL — ABNORMAL HIGH (ref 70–99)
Potassium: 4.2 mmol/L (ref 3.5–5.1)
Sodium: 139 mmol/L (ref 135–145)
Total Bilirubin: 1.9 mg/dL — ABNORMAL HIGH (ref 0.3–1.2)
Total Protein: 6.4 g/dL — ABNORMAL LOW (ref 6.5–8.1)

## 2019-01-30 LAB — LACTATE DEHYDROGENASE: LDH: 152 U/L (ref 98–192)

## 2019-02-02 DIAGNOSIS — Z23 Encounter for immunization: Secondary | ICD-10-CM | POA: Diagnosis not present

## 2019-02-03 ENCOUNTER — Telehealth: Payer: Self-pay | Admitting: Hematology

## 2019-02-03 NOTE — Telephone Encounter (Signed)
Scheduled per 01/29 los, patient has been called and notified.  

## 2019-02-16 DIAGNOSIS — Z85828 Personal history of other malignant neoplasm of skin: Secondary | ICD-10-CM | POA: Diagnosis not present

## 2019-02-16 DIAGNOSIS — C44319 Basal cell carcinoma of skin of other parts of face: Secondary | ICD-10-CM | POA: Diagnosis not present

## 2019-03-02 DIAGNOSIS — Z23 Encounter for immunization: Secondary | ICD-10-CM | POA: Diagnosis not present

## 2019-03-18 DIAGNOSIS — D72828 Other elevated white blood cell count: Secondary | ICD-10-CM | POA: Diagnosis not present

## 2019-03-18 DIAGNOSIS — D72820 Lymphocytosis (symptomatic): Secondary | ICD-10-CM | POA: Diagnosis not present

## 2019-03-18 DIAGNOSIS — I951 Orthostatic hypotension: Secondary | ICD-10-CM | POA: Diagnosis not present

## 2019-03-18 DIAGNOSIS — K222 Esophageal obstruction: Secondary | ICD-10-CM | POA: Diagnosis not present

## 2019-03-18 DIAGNOSIS — Z79899 Other long term (current) drug therapy: Secondary | ICD-10-CM | POA: Diagnosis not present

## 2019-03-18 DIAGNOSIS — Z0000 Encounter for general adult medical examination without abnormal findings: Secondary | ICD-10-CM | POA: Diagnosis not present

## 2019-03-18 DIAGNOSIS — N1831 Chronic kidney disease, stage 3a: Secondary | ICD-10-CM | POA: Diagnosis not present

## 2019-03-18 DIAGNOSIS — Z1389 Encounter for screening for other disorder: Secondary | ICD-10-CM | POA: Diagnosis not present

## 2019-03-18 DIAGNOSIS — Z7189 Other specified counseling: Secondary | ICD-10-CM | POA: Diagnosis not present

## 2019-03-18 DIAGNOSIS — K909 Intestinal malabsorption, unspecified: Secondary | ICD-10-CM | POA: Diagnosis not present

## 2019-03-23 DIAGNOSIS — Z20828 Contact with and (suspected) exposure to other viral communicable diseases: Secondary | ICD-10-CM | POA: Diagnosis not present

## 2019-04-14 DIAGNOSIS — H401131 Primary open-angle glaucoma, bilateral, mild stage: Secondary | ICD-10-CM | POA: Diagnosis not present

## 2019-05-05 DIAGNOSIS — D225 Melanocytic nevi of trunk: Secondary | ICD-10-CM | POA: Diagnosis not present

## 2019-05-05 DIAGNOSIS — D1801 Hemangioma of skin and subcutaneous tissue: Secondary | ICD-10-CM | POA: Diagnosis not present

## 2019-05-05 DIAGNOSIS — L821 Other seborrheic keratosis: Secondary | ICD-10-CM | POA: Diagnosis not present

## 2019-05-05 DIAGNOSIS — L7211 Pilar cyst: Secondary | ICD-10-CM | POA: Diagnosis not present

## 2019-05-05 DIAGNOSIS — D2262 Melanocytic nevi of left upper limb, including shoulder: Secondary | ICD-10-CM | POA: Diagnosis not present

## 2019-05-05 DIAGNOSIS — D2372 Other benign neoplasm of skin of left lower limb, including hip: Secondary | ICD-10-CM | POA: Diagnosis not present

## 2019-05-05 DIAGNOSIS — D2261 Melanocytic nevi of right upper limb, including shoulder: Secondary | ICD-10-CM | POA: Diagnosis not present

## 2019-05-05 DIAGNOSIS — Z85828 Personal history of other malignant neoplasm of skin: Secondary | ICD-10-CM | POA: Diagnosis not present

## 2019-05-05 DIAGNOSIS — D2371 Other benign neoplasm of skin of right lower limb, including hip: Secondary | ICD-10-CM | POA: Diagnosis not present

## 2019-05-05 DIAGNOSIS — L738 Other specified follicular disorders: Secondary | ICD-10-CM | POA: Diagnosis not present

## 2019-05-13 DIAGNOSIS — I951 Orthostatic hypotension: Secondary | ICD-10-CM | POA: Diagnosis not present

## 2019-05-13 DIAGNOSIS — F5101 Primary insomnia: Secondary | ICD-10-CM | POA: Diagnosis not present

## 2019-05-13 DIAGNOSIS — G250 Essential tremor: Secondary | ICD-10-CM | POA: Diagnosis not present

## 2019-05-13 DIAGNOSIS — R55 Syncope and collapse: Secondary | ICD-10-CM | POA: Diagnosis not present

## 2019-05-13 DIAGNOSIS — R1310 Dysphagia, unspecified: Secondary | ICD-10-CM | POA: Diagnosis not present

## 2019-05-13 DIAGNOSIS — Z79899 Other long term (current) drug therapy: Secondary | ICD-10-CM | POA: Diagnosis not present

## 2019-06-08 DIAGNOSIS — Z7189 Other specified counseling: Secondary | ICD-10-CM | POA: Diagnosis not present

## 2019-06-08 DIAGNOSIS — I951 Orthostatic hypotension: Secondary | ICD-10-CM | POA: Diagnosis not present

## 2019-09-15 DIAGNOSIS — I951 Orthostatic hypotension: Secondary | ICD-10-CM | POA: Diagnosis not present

## 2019-09-15 DIAGNOSIS — Z23 Encounter for immunization: Secondary | ICD-10-CM | POA: Diagnosis not present

## 2019-10-20 DIAGNOSIS — H401131 Primary open-angle glaucoma, bilateral, mild stage: Secondary | ICD-10-CM | POA: Diagnosis not present

## 2019-11-10 DIAGNOSIS — Z23 Encounter for immunization: Secondary | ICD-10-CM | POA: Diagnosis not present

## 2020-01-05 IMAGING — PT NM PET TUM IMG INITIAL (PI) SKULL BASE T - THIGH
7 series · 25 of 25 positions shown · non-contrast
Comparison: 04/29/2015 CT abdomen/pelvis.

CLINICAL DATA: Initial treatment strategy for small lymphocytic
lymphoma.

EXAM:
NUCLEAR MEDICINE PET SKULL BASE TO THIGH
TECHNIQUE: 8.1 mCi F-18 FDG was injected intravenously. Full-ring PET imaging
was performed from the skull base to thigh after the radiotracer. CT
data was obtained and used for attenuation correction and anatomic
localization.
Fasting blood glucose: 105 mg/dl

[Series 3: pet sk_thigh ac · axial · 5.0mm · 4.07mm/px · z∈[-1081,-73]mm · 6 of 253 slices shown]
[im 1/253]
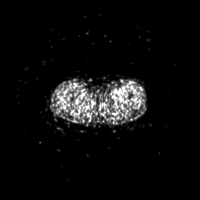
[im 51/253]
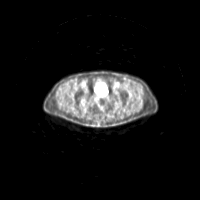
[im 101/253]
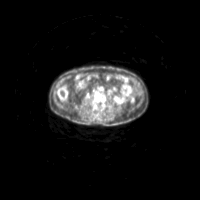
[im 152/253]
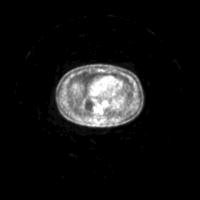
[im 202/253]
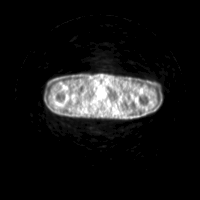
[im 253/253]
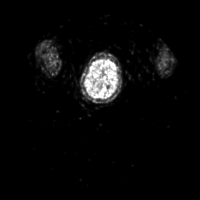

[Series 4: ct sk_thigh 5.0 b31f · axial · 5.0mm · 0.98mm/px · z∈[-1081,-73]mm · 5 of 253 slices shown]
[im 1/253]
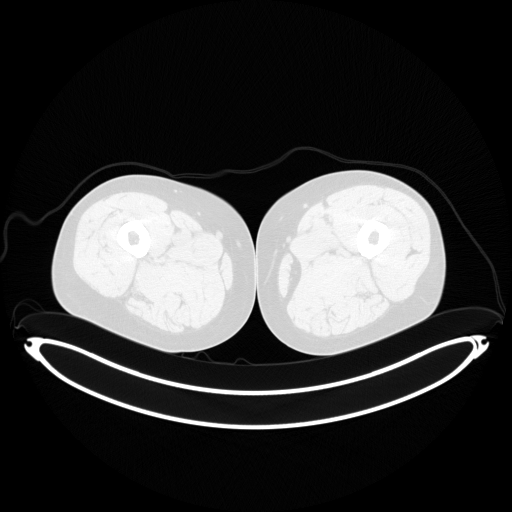
[im 64/253]
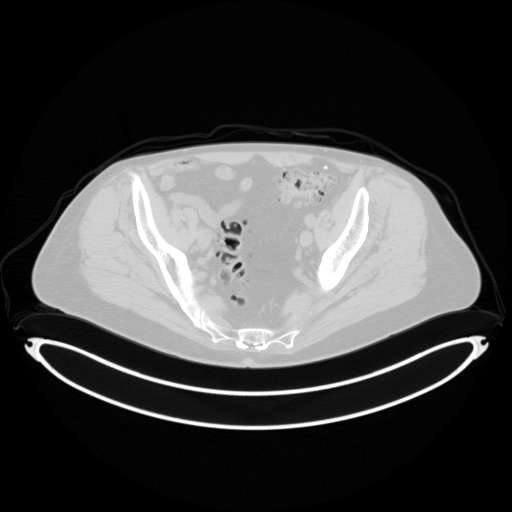
[im 127/253]
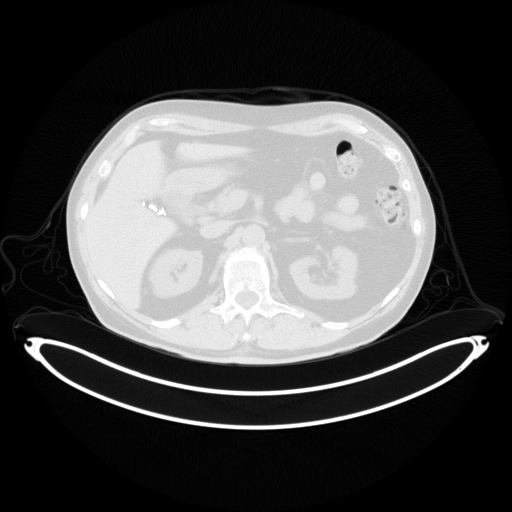
[im 190/253]
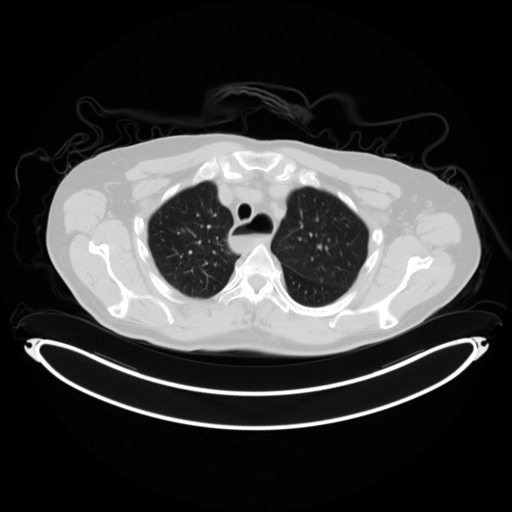
[im 253/253  brain]
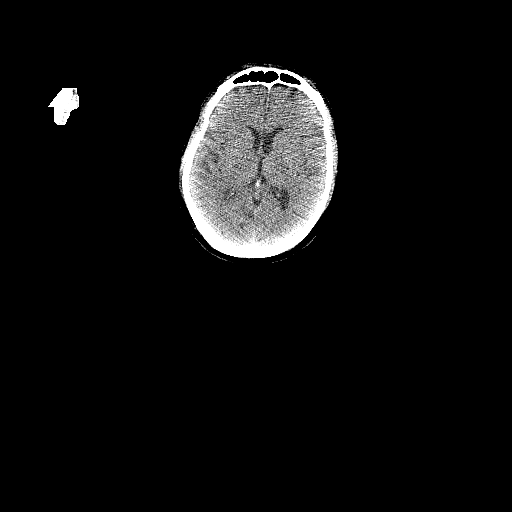

[Series 5: pet sk_thigh nac · axial · 5.0mm · 4.07mm/px · z∈[-1081,-73]mm · 5 of 253 slices shown]
[im 1/253]
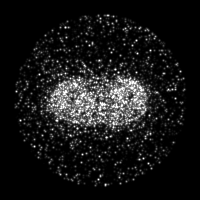
[im 64/253]
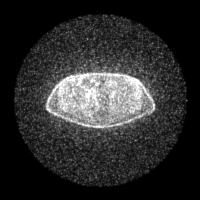
[im 127/253]
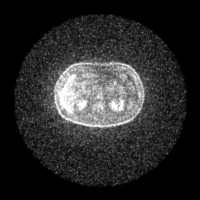
[im 190/253]
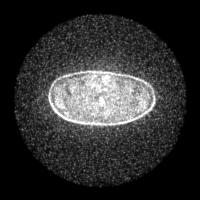
[im 253/253]
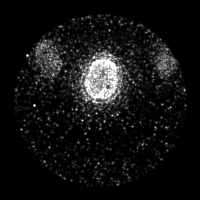

[Series 8: ct sk_thigh 5.0 b70f lung_bone · axial · 5.0mm · 0.68mm/px · z∈[-571,-235]mm · 2 of 85 slices shown]
[im 1/85  bone]
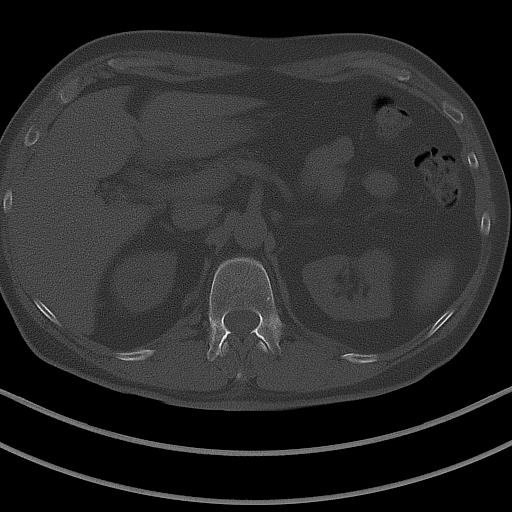
[im 85/85  bone]
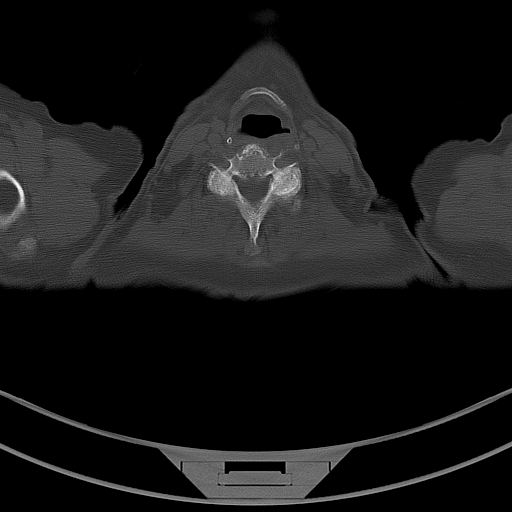

[Series 603: mip range 2 · coronal · 2.09mm/px · 1 of 32 slices shown]
[im 1/32]
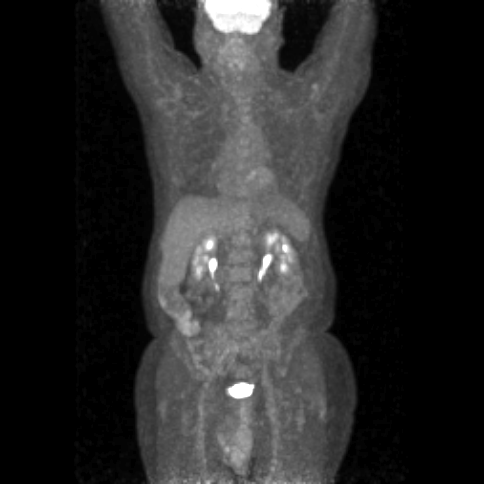

[Series 604: range-ct sk_thigh 5.0 (id)<alpha range> · 1 of 70 slices shown (1 of 2)]
[im 1/70]
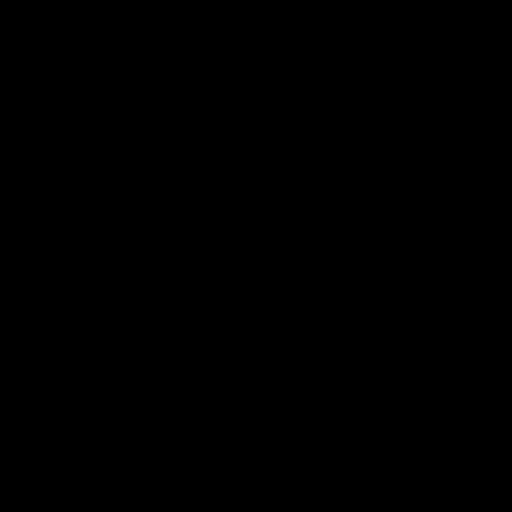

[Series 605: range-ct sk_thigh 5.0 (id)<alpha range> · 5 of 237 slices shown (2 of 2)]
[im 1/237]
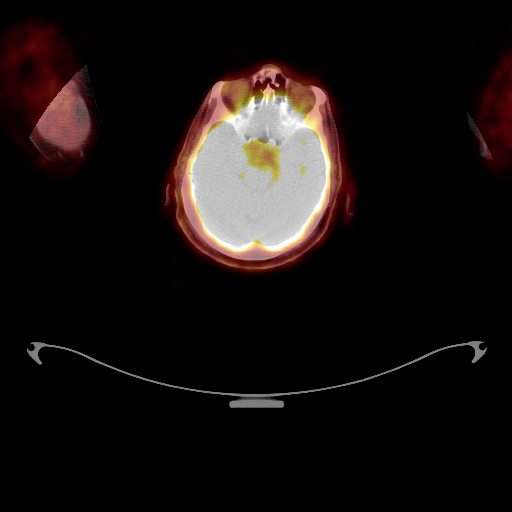
[im 60/237]
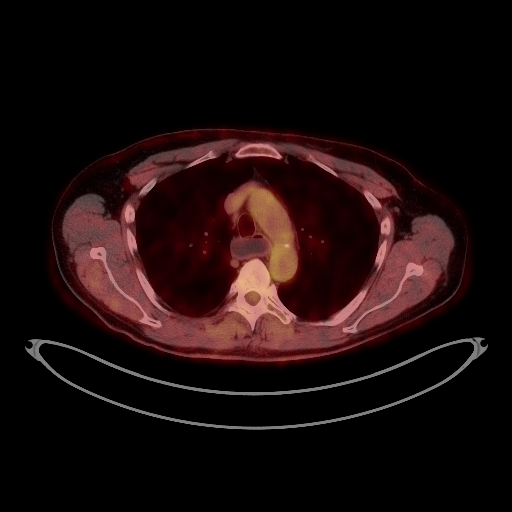
[im 119/237]
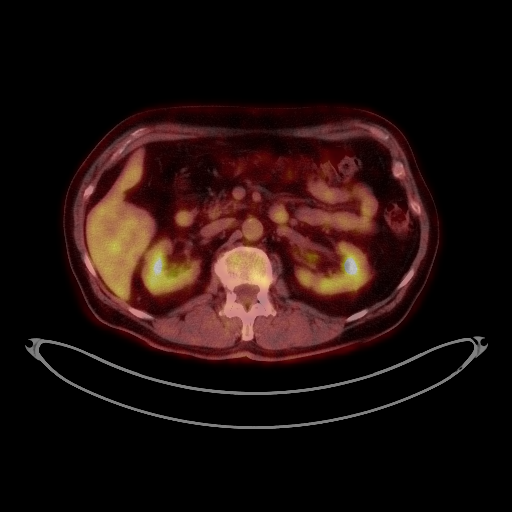
[im 178/237]
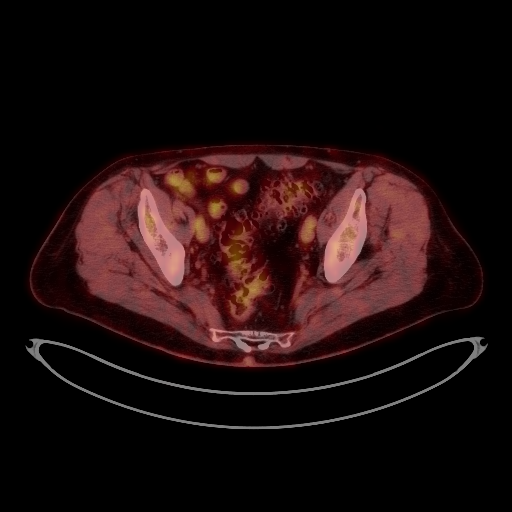
[im 237/237]
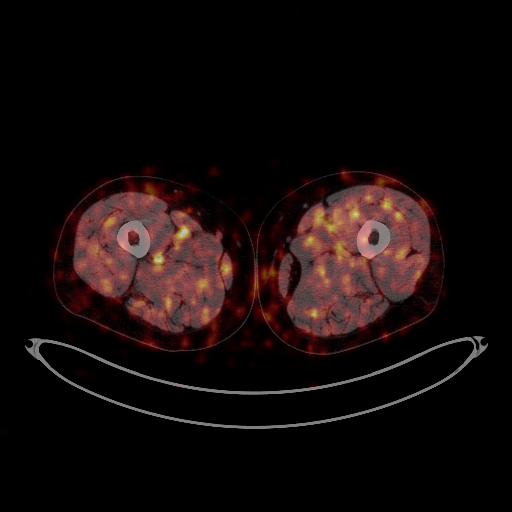

[25 of 25 positions shown; findings below may reference images not displayed]

FINDINGS: Mediastinal blood pool activity: SUV max

NECK: No hypermetabolic lymph nodes in the neck.

Incidental CT findings: Non hypermetabolic hypodense 2.2 cm thyroid
isthmus nodule.

CHEST: No enlarged or hypermetabolic axillary, mediastinal or hilar
lymph nodes. No hypermetabolic pulmonary findings.

Incidental CT findings: Prominently patulous thoracic esophagus with
esophageal fluid level. Nonspecific circumferential wall thickening
in the mid to lower thoracic esophagus. No focal esophageal mass or
esophageal hypermetabolism. Left anterior descending coronary
atherosclerosis. Atherosclerotic nonaneurysmal thoracic aorta.
Subpleural 6 mm basilar right lower lobe pulmonary nodule (series
8/image 63) is below PET resolution and stable since 04/29/2015,
considered benign. No acute consolidative airspace disease, lung
masses or additional significant pulmonary nodules.

ABDOMEN/PELVIS: No abnormal hypermetabolic activity within the
liver, pancreas, adrenal glands, or spleen. No hypermetabolic lymph
nodes in the abdomen or pelvis.

Incidental CT findings: Cholecystectomy. Atherosclerotic
nonaneurysmal abdominal aorta. Small hiatal hernia. Marked colonic
diverticulosis, most prominent in the sigmoid colon. Top-normal size
prostate. Stable diffuse bladder wall thickening and trabeculation.

SKELETON: No focal hypermetabolic activity to suggest skeletal
metastasis.

Incidental CT findings: none
IMPRESSION: 1. No hypermetabolic evidence of a lymphoproliferative process. No
lymphadenopathy. Normal size spleen. No osseous hypermetabolism.
2. Small hiatal hernia. Prominently patulous thoracic esophagus with
esophageal fluid level, suggesting prominent chronic esophageal
dysmotility and/or gastroesophageal reflux disease. Nonspecific
circumferential wall thickening in the mid to lower thoracic
esophagus, without discrete esophageal mass or hypermetabolism,
differential includes reflux esophagitis, Barrett's esophagus or
neoplasm. Upper endoscopic correlation may be obtained as clinically
warranted.
3. Chronic findings include: Aortic Atherosclerosis ([id number]).
Coronary atherosclerosis. Marked colonic diverticulosis.

## 2020-01-28 NOTE — Progress Notes (Signed)
HEMATOLOGY/ONCOLOGY CLINIC NOTE  Date of Service: 01/28/2020  Patient Care Team: Lajean Manes, MD as PCP - General (Internal Medicine)  CHIEF COMPLAINTS/PURPOSE OF CONSULTATION:  F/u for monoclonal B Lymphocytosis   HISTORY OF PRESENTING ILLNESS:   Bobby [name] is a wonderful 80 y.o. male who has been referred to [place] by Dr. Lajean Manes, his PCP, for evaluation and management of lymphocytosis.  He presents to the clinic today accompanied by his wife.  He notes lately he has felt low energy and low stamina. This change is not sudden but still a significant drop in the past 6 months. About 1 year prior he started to feel more presence of fatigue. He denied and lumps or bumps and palpable lymph nodes. No enlarged lymph nodes in his last CT from 2017.   On review of symptoms, pt notes significant fatigue from doing heavy yard work and walking a mile and his back now gets tired sooner. He denies SOB. He notes stable weight. He denies fever, chills, night sweats, abdominal pain, skin rash or itching or leg swelling. He denies recent viral infection.   He has been on low dose Fludrocortisone for 2-3 years for his orthostatic hypotension. He attributes this hypotension to prior alcohol use and other issues. He cut back on tylenol which he takes for osteoarthritis. No new medication change in the past 6 months.He denies iron or other vitamin deficiency. He notes he had a tight esophagus and had it stretched 5 years ago. He had gallbladder removed in 2017. Pt notes having to chew his food very well before swallowing. He was a smoker 40 years ago. He denies chemical exposures in the past. He is up to date on his cancer screenings. He has never had a blood transfusion.    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 The patient's last visit with [place] was on 01/30/2019. The pt reports that he is doing well overall.  The pt reports  that he has experiencing some fatigue. He is getting some exercise weekly. The pt denies any new medical issues. His Fludrocortisone was doubled in March/April 2021 due to increased BP. The pt denies any Potassium replacement and denies recent infection or steroid uses.  The pt has received all his COVID vaccines and booster.  Lab results today 01/29/2020 of CBC w/diff and CMP is as follows: all values are WNL except for WBC of 19.3K, RBC of 4.17, HCT of 37.2, Neutro Abs of 13.7K, Lymph Abs of 4.4K, Basophils Absolute of 0.2K, Potassium of 2.8, Glucose of 112, Creatinine of 1.29, Calcium of 8.6, AST of 10, Total Bilirubin of 1.6, GFR est of 57. 01/29/2020 LDH Is . Lab Results  Component Value Date   LDH 211 (H) 01/29/2020    On review of systems, pt reports fatigue, dizziness, lightheadedness and denies fevers, chills, night sweats, changes in urination, leg swelling, skin infections, boils, blood/mucus in stools and any other symptoms.  MEDICAL HISTORY:  Past Medical History:  Diagnosis Date  . Anxiety   . Diverticulosis   . Orthostatic hypotension   . Tinnitus     SURGICAL HISTORY: Past Surgical History:  Procedure Laterality Date  . BALLOON DILATION N/A 02/26/2012   Procedure: BALLOON DILATION;  Surgeon: Garlan Fair, MD;  Location: [facility];  Service: Endoscopy;  Laterality: N/A;  . CHOLECYSTECTOMY N/A 05/03/2015   Procedure: LAPAROSCOPIC CHOLECYSTECTOMY WITH INTRAOPERATIVE CHOLANGIOGRAM , LAPAROSCOPIC GASTROTOMY  ;  Surgeon: Shanon Brow  Lucia Gaskins, MD;  Location: [facility];  Service: General;  Laterality: N/A;  . ERCP N/A 05/03/2015   Procedure: ENDOSCOPIC RETROGRADE CHOLANGIOPANCREATOGRAPHY (ERCP);  Surgeon: Carol Ada, MD;  Location: [facility];  Service: Gastroenterology;  Laterality: N/A;  . ESOPHAGOGASTRODUODENOSCOPY N/A 02/26/2012   Procedure: ESOPHAGOGASTRODUODENOSCOPY (EGD);  Surgeon: Garlan Fair, MD;  Location: [facility];  Service: Endoscopy;  Laterality: N/A;  .  ESOPHAGOGASTRODUODENOSCOPY (EGD) WITH PROPOFOL N/A 05/02/2015   Procedure: ESOPHAGOGASTRODUODENOSCOPY (EGD) WITH PROPOFOL;  Surgeon: Arta Silence, MD;  Location: [facility];  Service: Endoscopy;  Laterality: N/A;  . TONSILLECTOMY      SOCIAL HISTORY: Social History   Socioeconomic History  . Marital status: Married    Spouse name: Not on file  . Number of children: Not on file  . Years of education: Not on file  . Highest education level: Not on file  Occupational History  . Not on file  Tobacco Use  . Smoking status: Former Smoker    Quit date: 1979    Years since quitting: 43.1  . Smokeless tobacco: Never Used  Vaping Use  . Vaping Use: Never used  Substance and Sexual Activity  . Alcohol use: Yes    Alcohol/week: 3.0 standard drinks    Types: 3 Shots of liquor per week    Comment: occasional  . Drug use: No  . Sexual activity: Not on file  Other Topics Concern  . Not on file  Social History Narrative  . Not on file   Social Determinants of Health   Financial Resource Strain: Not on file  Food Insecurity: Not on file  Transportation Needs: Not on file  Physical Activity: Not on file  Stress: Not on file  Social Connections: Not on file  Intimate Partner Violence: Not on file    FAMILY HISTORY: Family History  Problem Relation Age of Onset  . Cancer Mother   . Breast cancer Mother   . Stroke Mother   . Diabetes Neg Hx   . Hypertension Neg Hx     ALLERGIES:  has No Known Allergies.  MEDICATIONS:  Current Outpatient Medications  Medication Sig Dispense Refill  . acetaminophen (TYLENOL) 500 MG tablet Take 1,000-1,500 mg by mouth 2 (two) times daily.     . cholecalciferol (VITAMIN D) 1000 units tablet Take 1,000 Units by mouth daily.    . fludrocortisone (FLORINEF) 0.1 MG tablet Take 0.1 mg by mouth every morning.  0  . latanoprost (XALATAN) 0.005 % ophthalmic solution Place 1 drop into both eyes at bedtime.    . Multiple Vitamin (MULTIVITAMIN WITH  MINERALS) TABS tablet Take 1 tablet by mouth daily.    Marland Kitchen omeprazole (PRILOSEC) 20 MG capsule Take 20 mg by mouth daily with breakfast.  2   No current facility-administered medications for this visit.    REVIEW OF SYSTEMS:   10 Point review of Systems was done is negative except as noted above.  PHYSICAL EXAMINATION: ECOG FS:1 - Symptomatic but completely ambulatory  Vitals:   01/29/20 1108  BP: 134/63  Pulse: 80  Resp: 18  Temp: 97.8 F (36.6 C)  SpO2: 97%   Wt Readings from Last 3 Encounters:  01/30/19 204 lb (92.5 kg)  01/30/18 214 lb 1.6 oz (97.1 kg)  07/31/17 216 lb 14.4 oz (98.4 kg)   Body mass index is 26.23 kg/m.     GENERAL:alert, in no acute distress and comfortable SKIN: no acute rashes, no significant lesions EYES: conjunctiva are pink and non-injected, sclera anicteric OROPHARYNX:  MMM, no exudates, no oropharyngeal erythema or ulceration NECK: supple, no JVD LYMPH:  no palpable lymphadenopathy in the cervical, axillary or inguinal regions LUNGS: clear to auscultation b/l with normal respiratory effort HEART: regular rate & rhythm ABDOMEN:  normoactive bowel sounds , non tender, not distended. Extremity: no pedal edema PSYCH: alert & oriented x 3 with fluent speech NEURO: no focal motor/sensory deficits   LABORATORY DATA:  I have reviewed the data as listed  . CBC Latest Ref Rng & Units 01/30/2019 01/30/2018 07/31/2017  WBC 4.0 - 10.5 K/uL 11.5(H) 11.1(H) 11.4(H)  Hemoglobin 13.0 - 17.0 g/dL 13.0 13.3 13.2  Hematocrit 39.0 - 52.0 % 38.8(L) 39.4 39.3  Platelets 150 - 400 K/uL 231 215 199  HGB 13.1  . CBC    Component Value Date/Time   WBC 11.5 (H) 01/30/2019 1108   RBC 4.23 01/30/2019 1108   HGB 13.0 01/30/2019 1108   HGB 13.1 03/12/2017 1355   HCT 38.8 (L) 01/30/2019 1108   PLT 231 01/30/2019 1108   PLT 235 03/12/2017 1355   MCV 91.7 01/30/2019 1108   MCH 30.7 01/30/2019 1108   MCHC 33.5 01/30/2019 1108   RDW 11.9 01/30/2019 1108    LYMPHSABS 6.1 (H) 01/30/2019 1108   MONOABS 0.9 01/30/2019 1108   EOSABS 0.1 01/30/2019 1108   BASOSABS 0.1 01/30/2019 1108     . CMP Latest Ref Rng & Units 01/30/2019 01/30/2018 07/31/2017  Glucose 70 - 99 mg/dL 101(H) 117(H) 97  BUN 8 - 23 mg/dL 26(H) 21 23  Creatinine 0.61 - 1.24 mg/dL 1.28(H) 1.21 1.26(H)  Sodium 135 - 145 mmol/L 139 142 140  Potassium 3.5 - 5.1 mmol/L 4.2 3.9 4.2  Chloride 98 - 111 mmol/L 105 107 106  CO2 22 - 32 mmol/L 25 25 26   Calcium 8.9 - 10.3 mg/dL 8.9 9.0 9.3  Total Protein 6.5 - 8.1 g/dL 6.4(L) 6.3(L) 6.4(L)  Total Bilirubin 0.3 - 1.2 mg/dL 1.9(H) 1.0 1.3(H)  Alkaline Phos 38 - 126 U/L 48 51 49  AST 15 - 41 U/L 15 8(L) 8(L)  ALT 0 - 44 U/L 13 10 11    . Lab Results  Component Value Date   LDH 152 01/30/2019   Component     Latest Ref Rng & Units 03/12/2017  IgG (Immunoglobin G), Serum     700 - 1,600 mg/dL 546 (L)  IgA     61 - 437 mg/dL 59 (L)  IgM (Immunoglobulin M), Srm     15 - 143 mg/dL 32  Total Protein ELP     6.0 - 8.5 g/dL 6.1  Albumin SerPl Elph-Mcnc     2.9 - 4.4 g/dL 3.7  Alpha 1     0.0 - 0.4 g/dL 0.2  Alpha2 Glob SerPl Elph-Mcnc     0.4 - 1.0 g/dL 0.7  B-Globulin SerPl Elph-Mcnc     0.7 - 1.3 g/dL 0.9  Gamma Glob SerPl Elph-Mcnc     0.4 - 1.8 g/dL 0.6  M Protein SerPl Elph-Mcnc     Not Observed g/dL Not Observed  Globulin, Total     2.2 - 3.9 g/dL 2.4  Albumin/Glob SerPl     0.7 - 1.7 1.6  IFE 1      Comment  Please Note (HCV):      Comment  Kappa free light chain     3.3 - 19.4 mg/L 15.7  Lamda free light chains     5.7 - 26.3 mg/L 14.4  Kappa, lamda light  chain ratio     0.26 - 1.65 1.09  Retic Ct Pct     0.8 - 1.8 % 0.7 (L)  RBC.     4.20 - 5.82 MIL/uL 4.28  Retic Count, Absolute     34.8 - 93.9 K/uL 30.0 (L)  HCV Ab     0.0 - 0.9 s/co ratio <0.1  LDH     125 - 245 U/L 127     OUTSIDE LABS    RADIOGRAPHIC STUDIES: I have personally reviewed the radiological images as listed and agreed with the  findings in the report. No results found.  ASSESSMENT & PLAN:  Bobby [name] is a 80 y.o. caucasian male with    1. Lymphocytosis - workup consistent with Monoclonal B Lymphocytosis  Labs show Lymphocytes ranging 5-6K of which about 80% <5k were clonal with CD5+ (less than needed for a diagnosis of CLL) Also PET/CT with no Lnadenopathy or hepato-splenomegaly No clinically palpable enlarged lymph nodes, splenomegaly or hepatomegaly.  LDH WNL Hep C neg  03/25/17 PET/CT revealed No hypermetabolic evidence of a lymphoproliferative process. No lymphadenopathy. Normal size spleen. No osseous hypermetabolism. Small hiatal hernia. Prominently patulous thoracic esophagus with esophageal fluid level, suggesting prominent chronic esophageal dysmotility and/or gastroesophageal reflux disease. Nonspecific circumferential wall thickening in the mid to lower thoracic esophagus, without discrete esophageal mass or hypermetabolism, differential includes reflux esophagitis, Barrett's esophagus or neoplasm. Upper endoscopic correlation may be obtained as clinically warranted. Chronic findings include: Aortic Atherosclerosis  Coronary atherosclerosis. Marked colonic diverticulosis.  2) . Patient Active Problem List   Diagnosis Date Noted  . Normocytic anemia   . Gallstone pancreatitis 04/30/2015  . Orthostatic hypotension 04/30/2015  . Pancreatitis 04/30/2015  . Acute gallstone pancreatitis     PLAN: -Discussed pt labwork today, 01/29/2020; some Neutrophilia, Potassium decreased. -Potassium levels due to Fludrocortisone. Advised pt need Potassium replacement ASAP. -Recommended pt use compression socks.  -Recommend pt continue to monitor WBC w PCP. -Recommend pt see PCP within 2-3 weeks. -Currently no concerns for progression regarding Monoclonal B Lymphocytosis. -Will continue with annual visits and see back in 12 months.   FOLLOW UP: RTC with Dr Irene Limbo with labs in 12 months   The total time  spent in the appointment was 20 minutes and more than 50% was on counseling and direct patient cares.  All of the patient's questions were answered with apparent satisfaction. The patient knows to call the clinic with any problems, questions or concerns.     Sullivan Lone MD [place] AAHIVMS [facility] Hematology/Oncology Physician [facility]  (Office):       [phone] (Work cell):  [phone] (Fax):           [phone]  01/28/2020 4:15 PM  I, Reinaldo Raddle, am acting as scribe for Dr. Sullivan Lone, MD.    .I have reviewed the above documentation for accuracy and completeness, and I agree with the above. Brunetta Genera MD

## 2020-01-29 ENCOUNTER — Other Ambulatory Visit: Payer: Self-pay

## 2020-01-29 ENCOUNTER — Inpatient Hospital Stay: Payer: Medicare Other | Attending: Hematology

## 2020-01-29 ENCOUNTER — Inpatient Hospital Stay (HOSPITAL_BASED_OUTPATIENT_CLINIC_OR_DEPARTMENT_OTHER): Payer: Medicare Other | Admitting: Hematology

## 2020-01-29 VITALS — BP 134/63 | HR 80 | Temp 97.8°F | Resp 18 | Ht 72.0 in | Wt 193.4 lb

## 2020-01-29 DIAGNOSIS — Z803 Family history of malignant neoplasm of breast: Secondary | ICD-10-CM | POA: Diagnosis not present

## 2020-01-29 DIAGNOSIS — Z87891 Personal history of nicotine dependence: Secondary | ICD-10-CM | POA: Diagnosis not present

## 2020-01-29 DIAGNOSIS — I700 Atherosclerosis of aorta: Secondary | ICD-10-CM | POA: Insufficient documentation

## 2020-01-29 DIAGNOSIS — Z79899 Other long term (current) drug therapy: Secondary | ICD-10-CM | POA: Insufficient documentation

## 2020-01-29 DIAGNOSIS — Z809 Family history of malignant neoplasm, unspecified: Secondary | ICD-10-CM | POA: Diagnosis not present

## 2020-01-29 DIAGNOSIS — R5383 Other fatigue: Secondary | ICD-10-CM | POA: Insufficient documentation

## 2020-01-29 DIAGNOSIS — M1990 Unspecified osteoarthritis, unspecified site: Secondary | ICD-10-CM | POA: Insufficient documentation

## 2020-01-29 DIAGNOSIS — D72820 Lymphocytosis (symptomatic): Secondary | ICD-10-CM | POA: Insufficient documentation

## 2020-01-29 DIAGNOSIS — Z823 Family history of stroke: Secondary | ICD-10-CM | POA: Insufficient documentation

## 2020-01-29 DIAGNOSIS — I2510 Atherosclerotic heart disease of native coronary artery without angina pectoris: Secondary | ICD-10-CM | POA: Diagnosis not present

## 2020-01-29 LAB — CBC WITH DIFFERENTIAL/PLATELET
Abs Immature Granulocytes: 0 10*3/uL (ref 0.00–0.07)
Band Neutrophils: 1 %
Basophils Absolute: 0.2 10*3/uL — ABNORMAL HIGH (ref 0.0–0.1)
Basophils Relative: 1 %
Eosinophils Absolute: 0.4 10*3/uL (ref 0.0–0.5)
Eosinophils Relative: 2 %
HCT: 37.2 % — ABNORMAL LOW (ref 39.0–52.0)
Hemoglobin: 13 g/dL (ref 13.0–17.0)
Lymphocytes Relative: 23 %
Lymphs Abs: 4.4 10*3/uL — ABNORMAL HIGH (ref 0.7–4.0)
MCH: 31.2 pg (ref 26.0–34.0)
MCHC: 34.9 g/dL (ref 30.0–36.0)
MCV: 89.2 fL (ref 80.0–100.0)
Monocytes Absolute: 0.6 10*3/uL (ref 0.1–1.0)
Monocytes Relative: 3 %
Neutro Abs: 13.7 10*3/uL — ABNORMAL HIGH (ref 1.7–7.7)
Neutrophils Relative %: 70 %
Platelets: 224 10*3/uL (ref 150–400)
RBC: 4.17 MIL/uL — ABNORMAL LOW (ref 4.22–5.81)
RDW: 12.4 % (ref 11.5–15.5)
WBC: 19.3 10*3/uL — ABNORMAL HIGH (ref 4.0–10.5)
nRBC: 0 % (ref 0.0–0.2)

## 2020-01-29 LAB — CMP (CANCER CENTER ONLY)
ALT: 7 U/L (ref 0–44)
AST: 10 U/L — ABNORMAL LOW (ref 15–41)
Albumin: 3.9 g/dL (ref 3.5–5.0)
Alkaline Phosphatase: 56 U/L (ref 38–126)
Anion gap: 11 (ref 5–15)
BUN: 18 mg/dL (ref 8–23)
CO2: 32 mmol/L (ref 22–32)
Calcium: 8.6 mg/dL — ABNORMAL LOW (ref 8.9–10.3)
Chloride: 101 mmol/L (ref 98–111)
Creatinine: 1.29 mg/dL — ABNORMAL HIGH (ref 0.61–1.24)
GFR, Estimated: 57 mL/min — ABNORMAL LOW (ref >60)
Glucose, Bld: 112 mg/dL — ABNORMAL HIGH (ref 70–99)
Potassium: 2.8 mmol/L — ABNORMAL LOW (ref 3.5–5.1)
Sodium: 144 mmol/L (ref 135–145)
Total Bilirubin: 1.6 mg/dL — ABNORMAL HIGH (ref 0.3–1.2)
Total Protein: 6.5 g/dL (ref 6.5–8.1)

## 2020-01-29 LAB — LACTATE DEHYDROGENASE: LDH: 211 U/L — ABNORMAL HIGH (ref 98–192)

## 2020-01-29 MED ORDER — POTASSIUM CHLORIDE CRYS ER 20 MEQ PO TBCR: EXTENDED_RELEASE_TABLET | ORAL | 0 refills | Status: AC

## 2020-02-16 DIAGNOSIS — I951 Orthostatic hypotension: Secondary | ICD-10-CM | POA: Diagnosis not present

## 2020-02-16 DIAGNOSIS — G250 Essential tremor: Secondary | ICD-10-CM | POA: Diagnosis not present

## 2020-03-22 DIAGNOSIS — D72820 Lymphocytosis (symptomatic): Secondary | ICD-10-CM | POA: Diagnosis not present

## 2020-03-22 DIAGNOSIS — K909 Intestinal malabsorption, unspecified: Secondary | ICD-10-CM | POA: Diagnosis not present

## 2020-03-22 DIAGNOSIS — N1831 Chronic kidney disease, stage 3a: Secondary | ICD-10-CM | POA: Diagnosis not present

## 2020-03-22 DIAGNOSIS — I700 Atherosclerosis of aorta: Secondary | ICD-10-CM | POA: Diagnosis not present

## 2020-03-22 DIAGNOSIS — I951 Orthostatic hypotension: Secondary | ICD-10-CM | POA: Diagnosis not present

## 2020-03-22 DIAGNOSIS — Z0000 Encounter for general adult medical examination without abnormal findings: Secondary | ICD-10-CM | POA: Diagnosis not present

## 2020-03-22 DIAGNOSIS — Z79899 Other long term (current) drug therapy: Secondary | ICD-10-CM | POA: Diagnosis not present

## 2020-03-22 DIAGNOSIS — Z23 Encounter for immunization: Secondary | ICD-10-CM | POA: Diagnosis not present

## 2020-03-22 DIAGNOSIS — M25511 Pain in right shoulder: Secondary | ICD-10-CM | POA: Diagnosis not present

## 2020-03-22 DIAGNOSIS — Z1389 Encounter for screening for other disorder: Secondary | ICD-10-CM | POA: Diagnosis not present

## 2020-03-22 DIAGNOSIS — G250 Essential tremor: Secondary | ICD-10-CM | POA: Diagnosis not present

## 2020-04-19 DIAGNOSIS — H401131 Primary open-angle glaucoma, bilateral, mild stage: Secondary | ICD-10-CM | POA: Diagnosis not present

## 2020-05-10 DIAGNOSIS — D2262 Melanocytic nevi of left upper limb, including shoulder: Secondary | ICD-10-CM | POA: Diagnosis not present

## 2020-05-10 DIAGNOSIS — L821 Other seborrheic keratosis: Secondary | ICD-10-CM | POA: Diagnosis not present

## 2020-05-10 DIAGNOSIS — D1801 Hemangioma of skin and subcutaneous tissue: Secondary | ICD-10-CM | POA: Diagnosis not present

## 2020-05-10 DIAGNOSIS — L578 Other skin changes due to chronic exposure to nonionizing radiation: Secondary | ICD-10-CM | POA: Diagnosis not present

## 2020-05-10 DIAGNOSIS — Z85828 Personal history of other malignant neoplasm of skin: Secondary | ICD-10-CM | POA: Diagnosis not present

## 2020-05-31 DIAGNOSIS — Z23 Encounter for immunization: Secondary | ICD-10-CM | POA: Diagnosis not present

## 2020-06-21 DIAGNOSIS — N1831 Chronic kidney disease, stage 3a: Secondary | ICD-10-CM | POA: Diagnosis not present

## 2020-06-21 DIAGNOSIS — M25511 Pain in right shoulder: Secondary | ICD-10-CM | POA: Diagnosis not present

## 2020-06-21 DIAGNOSIS — I951 Orthostatic hypotension: Secondary | ICD-10-CM | POA: Diagnosis not present

## 2020-06-28 DIAGNOSIS — M25511 Pain in right shoulder: Secondary | ICD-10-CM | POA: Diagnosis not present

## 2020-07-15 DIAGNOSIS — M25511 Pain in right shoulder: Secondary | ICD-10-CM | POA: Diagnosis not present

## 2020-07-15 DIAGNOSIS — M6281 Muscle weakness (generalized): Secondary | ICD-10-CM | POA: Diagnosis not present

## 2020-07-15 DIAGNOSIS — M7541 Impingement syndrome of right shoulder: Secondary | ICD-10-CM | POA: Diagnosis not present

## 2020-07-15 DIAGNOSIS — R29898 Other symptoms and signs involving the musculoskeletal system: Secondary | ICD-10-CM | POA: Diagnosis not present

## 2020-07-18 DIAGNOSIS — M7541 Impingement syndrome of right shoulder: Secondary | ICD-10-CM | POA: Diagnosis not present

## 2020-07-18 DIAGNOSIS — M25511 Pain in right shoulder: Secondary | ICD-10-CM | POA: Diagnosis not present

## 2020-07-18 DIAGNOSIS — R29898 Other symptoms and signs involving the musculoskeletal system: Secondary | ICD-10-CM | POA: Diagnosis not present

## 2020-07-18 DIAGNOSIS — M6281 Muscle weakness (generalized): Secondary | ICD-10-CM | POA: Diagnosis not present

## 2020-07-20 DIAGNOSIS — R29898 Other symptoms and signs involving the musculoskeletal system: Secondary | ICD-10-CM | POA: Diagnosis not present

## 2020-07-20 DIAGNOSIS — M6281 Muscle weakness (generalized): Secondary | ICD-10-CM | POA: Diagnosis not present

## 2020-07-20 DIAGNOSIS — M7541 Impingement syndrome of right shoulder: Secondary | ICD-10-CM | POA: Diagnosis not present

## 2020-07-20 DIAGNOSIS — M25511 Pain in right shoulder: Secondary | ICD-10-CM | POA: Diagnosis not present

## 2020-07-22 DIAGNOSIS — M7541 Impingement syndrome of right shoulder: Secondary | ICD-10-CM | POA: Diagnosis not present

## 2020-07-22 DIAGNOSIS — M25511 Pain in right shoulder: Secondary | ICD-10-CM | POA: Diagnosis not present

## 2020-07-22 DIAGNOSIS — R29898 Other symptoms and signs involving the musculoskeletal system: Secondary | ICD-10-CM | POA: Diagnosis not present

## 2020-07-22 DIAGNOSIS — M6281 Muscle weakness (generalized): Secondary | ICD-10-CM | POA: Diagnosis not present

## 2020-07-25 DIAGNOSIS — M6281 Muscle weakness (generalized): Secondary | ICD-10-CM | POA: Diagnosis not present

## 2020-07-25 DIAGNOSIS — M25511 Pain in right shoulder: Secondary | ICD-10-CM | POA: Diagnosis not present

## 2020-07-25 DIAGNOSIS — R29898 Other symptoms and signs involving the musculoskeletal system: Secondary | ICD-10-CM | POA: Diagnosis not present

## 2020-07-25 DIAGNOSIS — M7541 Impingement syndrome of right shoulder: Secondary | ICD-10-CM | POA: Diagnosis not present

## 2020-07-27 DIAGNOSIS — R29898 Other symptoms and signs involving the musculoskeletal system: Secondary | ICD-10-CM | POA: Diagnosis not present

## 2020-07-27 DIAGNOSIS — M25511 Pain in right shoulder: Secondary | ICD-10-CM | POA: Diagnosis not present

## 2020-07-27 DIAGNOSIS — M7541 Impingement syndrome of right shoulder: Secondary | ICD-10-CM | POA: Diagnosis not present

## 2020-07-27 DIAGNOSIS — M6281 Muscle weakness (generalized): Secondary | ICD-10-CM | POA: Diagnosis not present

## 2020-07-29 DIAGNOSIS — M6281 Muscle weakness (generalized): Secondary | ICD-10-CM | POA: Diagnosis not present

## 2020-07-29 DIAGNOSIS — R29898 Other symptoms and signs involving the musculoskeletal system: Secondary | ICD-10-CM | POA: Diagnosis not present

## 2020-07-29 DIAGNOSIS — M7541 Impingement syndrome of right shoulder: Secondary | ICD-10-CM | POA: Diagnosis not present

## 2020-07-29 DIAGNOSIS — M25511 Pain in right shoulder: Secondary | ICD-10-CM | POA: Diagnosis not present

## 2020-08-01 DIAGNOSIS — M25511 Pain in right shoulder: Secondary | ICD-10-CM | POA: Diagnosis not present

## 2020-08-01 DIAGNOSIS — R29898 Other symptoms and signs involving the musculoskeletal system: Secondary | ICD-10-CM | POA: Diagnosis not present

## 2020-08-01 DIAGNOSIS — M7541 Impingement syndrome of right shoulder: Secondary | ICD-10-CM | POA: Diagnosis not present

## 2020-08-01 DIAGNOSIS — M6281 Muscle weakness (generalized): Secondary | ICD-10-CM | POA: Diagnosis not present

## 2020-08-09 DIAGNOSIS — M25511 Pain in right shoulder: Secondary | ICD-10-CM | POA: Diagnosis not present

## 2020-08-10 DIAGNOSIS — M25511 Pain in right shoulder: Secondary | ICD-10-CM | POA: Diagnosis not present

## 2020-08-10 DIAGNOSIS — M7541 Impingement syndrome of right shoulder: Secondary | ICD-10-CM | POA: Diagnosis not present

## 2020-08-10 DIAGNOSIS — M6281 Muscle weakness (generalized): Secondary | ICD-10-CM | POA: Diagnosis not present

## 2020-08-10 DIAGNOSIS — R29898 Other symptoms and signs involving the musculoskeletal system: Secondary | ICD-10-CM | POA: Diagnosis not present

## 2020-08-12 DIAGNOSIS — M7541 Impingement syndrome of right shoulder: Secondary | ICD-10-CM | POA: Diagnosis not present

## 2020-08-12 DIAGNOSIS — M6281 Muscle weakness (generalized): Secondary | ICD-10-CM | POA: Diagnosis not present

## 2020-08-12 DIAGNOSIS — M25511 Pain in right shoulder: Secondary | ICD-10-CM | POA: Diagnosis not present

## 2020-08-12 DIAGNOSIS — R29898 Other symptoms and signs involving the musculoskeletal system: Secondary | ICD-10-CM | POA: Diagnosis not present

## 2020-08-15 DIAGNOSIS — M25511 Pain in right shoulder: Secondary | ICD-10-CM | POA: Diagnosis not present

## 2020-08-23 DIAGNOSIS — M25511 Pain in right shoulder: Secondary | ICD-10-CM | POA: Diagnosis not present

## 2020-08-24 DIAGNOSIS — R29898 Other symptoms and signs involving the musculoskeletal system: Secondary | ICD-10-CM | POA: Diagnosis not present

## 2020-08-24 DIAGNOSIS — M25511 Pain in right shoulder: Secondary | ICD-10-CM | POA: Diagnosis not present

## 2020-08-24 DIAGNOSIS — M6281 Muscle weakness (generalized): Secondary | ICD-10-CM | POA: Diagnosis not present

## 2020-08-24 DIAGNOSIS — M7541 Impingement syndrome of right shoulder: Secondary | ICD-10-CM | POA: Diagnosis not present

## 2020-08-25 DIAGNOSIS — R29898 Other symptoms and signs involving the musculoskeletal system: Secondary | ICD-10-CM | POA: Diagnosis not present

## 2020-08-25 DIAGNOSIS — M25511 Pain in right shoulder: Secondary | ICD-10-CM | POA: Diagnosis not present

## 2020-08-25 DIAGNOSIS — M6281 Muscle weakness (generalized): Secondary | ICD-10-CM | POA: Diagnosis not present

## 2020-08-25 DIAGNOSIS — M7541 Impingement syndrome of right shoulder: Secondary | ICD-10-CM | POA: Diagnosis not present

## 2020-08-29 DIAGNOSIS — M6281 Muscle weakness (generalized): Secondary | ICD-10-CM | POA: Diagnosis not present

## 2020-08-29 DIAGNOSIS — M7541 Impingement syndrome of right shoulder: Secondary | ICD-10-CM | POA: Diagnosis not present

## 2020-08-29 DIAGNOSIS — R29898 Other symptoms and signs involving the musculoskeletal system: Secondary | ICD-10-CM | POA: Diagnosis not present

## 2020-08-29 DIAGNOSIS — M25511 Pain in right shoulder: Secondary | ICD-10-CM | POA: Diagnosis not present

## 2020-08-30 DIAGNOSIS — M7541 Impingement syndrome of right shoulder: Secondary | ICD-10-CM | POA: Diagnosis not present

## 2020-08-30 DIAGNOSIS — M6281 Muscle weakness (generalized): Secondary | ICD-10-CM | POA: Diagnosis not present

## 2020-08-30 DIAGNOSIS — M25511 Pain in right shoulder: Secondary | ICD-10-CM | POA: Diagnosis not present

## 2020-08-30 DIAGNOSIS — R29898 Other symptoms and signs involving the musculoskeletal system: Secondary | ICD-10-CM | POA: Diagnosis not present

## 2020-09-01 DIAGNOSIS — M7541 Impingement syndrome of right shoulder: Secondary | ICD-10-CM | POA: Diagnosis not present

## 2020-09-01 DIAGNOSIS — M25511 Pain in right shoulder: Secondary | ICD-10-CM | POA: Diagnosis not present

## 2020-09-01 DIAGNOSIS — R29898 Other symptoms and signs involving the musculoskeletal system: Secondary | ICD-10-CM | POA: Diagnosis not present

## 2020-09-01 DIAGNOSIS — M6281 Muscle weakness (generalized): Secondary | ICD-10-CM | POA: Diagnosis not present

## 2020-09-05 DIAGNOSIS — M7541 Impingement syndrome of right shoulder: Secondary | ICD-10-CM | POA: Diagnosis not present

## 2020-09-05 DIAGNOSIS — M6281 Muscle weakness (generalized): Secondary | ICD-10-CM | POA: Diagnosis not present

## 2020-09-05 DIAGNOSIS — R29898 Other symptoms and signs involving the musculoskeletal system: Secondary | ICD-10-CM | POA: Diagnosis not present

## 2020-09-05 DIAGNOSIS — M25511 Pain in right shoulder: Secondary | ICD-10-CM | POA: Diagnosis not present

## 2020-09-06 DIAGNOSIS — R29898 Other symptoms and signs involving the musculoskeletal system: Secondary | ICD-10-CM | POA: Diagnosis not present

## 2020-09-06 DIAGNOSIS — M6281 Muscle weakness (generalized): Secondary | ICD-10-CM | POA: Diagnosis not present

## 2020-09-06 DIAGNOSIS — M7541 Impingement syndrome of right shoulder: Secondary | ICD-10-CM | POA: Diagnosis not present

## 2020-09-06 DIAGNOSIS — M25511 Pain in right shoulder: Secondary | ICD-10-CM | POA: Diagnosis not present

## 2020-09-08 DIAGNOSIS — M7541 Impingement syndrome of right shoulder: Secondary | ICD-10-CM | POA: Diagnosis not present

## 2020-09-08 DIAGNOSIS — R29898 Other symptoms and signs involving the musculoskeletal system: Secondary | ICD-10-CM | POA: Diagnosis not present

## 2020-09-08 DIAGNOSIS — M6281 Muscle weakness (generalized): Secondary | ICD-10-CM | POA: Diagnosis not present

## 2020-09-08 DIAGNOSIS — M25511 Pain in right shoulder: Secondary | ICD-10-CM | POA: Diagnosis not present

## 2020-09-12 DIAGNOSIS — R29898 Other symptoms and signs involving the musculoskeletal system: Secondary | ICD-10-CM | POA: Diagnosis not present

## 2020-09-12 DIAGNOSIS — M25511 Pain in right shoulder: Secondary | ICD-10-CM | POA: Diagnosis not present

## 2020-09-12 DIAGNOSIS — M6281 Muscle weakness (generalized): Secondary | ICD-10-CM | POA: Diagnosis not present

## 2020-09-12 DIAGNOSIS — M7541 Impingement syndrome of right shoulder: Secondary | ICD-10-CM | POA: Diagnosis not present

## 2020-09-13 DIAGNOSIS — M7541 Impingement syndrome of right shoulder: Secondary | ICD-10-CM | POA: Diagnosis not present

## 2020-09-13 DIAGNOSIS — M25511 Pain in right shoulder: Secondary | ICD-10-CM | POA: Diagnosis not present

## 2020-09-13 DIAGNOSIS — R29898 Other symptoms and signs involving the musculoskeletal system: Secondary | ICD-10-CM | POA: Diagnosis not present

## 2020-09-13 DIAGNOSIS — M6281 Muscle weakness (generalized): Secondary | ICD-10-CM | POA: Diagnosis not present

## 2020-09-15 DIAGNOSIS — M6281 Muscle weakness (generalized): Secondary | ICD-10-CM | POA: Diagnosis not present

## 2020-09-15 DIAGNOSIS — R29898 Other symptoms and signs involving the musculoskeletal system: Secondary | ICD-10-CM | POA: Diagnosis not present

## 2020-09-15 DIAGNOSIS — M7541 Impingement syndrome of right shoulder: Secondary | ICD-10-CM | POA: Diagnosis not present

## 2020-09-15 DIAGNOSIS — M25511 Pain in right shoulder: Secondary | ICD-10-CM | POA: Diagnosis not present

## 2020-09-19 DIAGNOSIS — M7541 Impingement syndrome of right shoulder: Secondary | ICD-10-CM | POA: Diagnosis not present

## 2020-09-19 DIAGNOSIS — M6281 Muscle weakness (generalized): Secondary | ICD-10-CM | POA: Diagnosis not present

## 2020-09-19 DIAGNOSIS — M25511 Pain in right shoulder: Secondary | ICD-10-CM | POA: Diagnosis not present

## 2020-09-19 DIAGNOSIS — R29898 Other symptoms and signs involving the musculoskeletal system: Secondary | ICD-10-CM | POA: Diagnosis not present

## 2020-09-20 DIAGNOSIS — Z23 Encounter for immunization: Secondary | ICD-10-CM | POA: Diagnosis not present

## 2020-09-22 DIAGNOSIS — M6281 Muscle weakness (generalized): Secondary | ICD-10-CM | POA: Diagnosis not present

## 2020-09-22 DIAGNOSIS — R29898 Other symptoms and signs involving the musculoskeletal system: Secondary | ICD-10-CM | POA: Diagnosis not present

## 2020-09-22 DIAGNOSIS — M25511 Pain in right shoulder: Secondary | ICD-10-CM | POA: Diagnosis not present

## 2020-09-22 DIAGNOSIS — M7541 Impingement syndrome of right shoulder: Secondary | ICD-10-CM | POA: Diagnosis not present

## 2020-09-26 DIAGNOSIS — M25511 Pain in right shoulder: Secondary | ICD-10-CM | POA: Diagnosis not present

## 2020-09-26 DIAGNOSIS — M7541 Impingement syndrome of right shoulder: Secondary | ICD-10-CM | POA: Diagnosis not present

## 2020-09-26 DIAGNOSIS — R29898 Other symptoms and signs involving the musculoskeletal system: Secondary | ICD-10-CM | POA: Diagnosis not present

## 2020-09-26 DIAGNOSIS — M6281 Muscle weakness (generalized): Secondary | ICD-10-CM | POA: Diagnosis not present

## 2020-09-27 DIAGNOSIS — M25511 Pain in right shoulder: Secondary | ICD-10-CM | POA: Diagnosis not present

## 2020-09-29 DIAGNOSIS — R29898 Other symptoms and signs involving the musculoskeletal system: Secondary | ICD-10-CM | POA: Diagnosis not present

## 2020-09-29 DIAGNOSIS — M7541 Impingement syndrome of right shoulder: Secondary | ICD-10-CM | POA: Diagnosis not present

## 2020-09-29 DIAGNOSIS — M6281 Muscle weakness (generalized): Secondary | ICD-10-CM | POA: Diagnosis not present

## 2020-09-29 DIAGNOSIS — M25511 Pain in right shoulder: Secondary | ICD-10-CM | POA: Diagnosis not present

## 2020-10-03 DIAGNOSIS — M25511 Pain in right shoulder: Secondary | ICD-10-CM | POA: Diagnosis not present

## 2020-10-03 DIAGNOSIS — M7541 Impingement syndrome of right shoulder: Secondary | ICD-10-CM | POA: Diagnosis not present

## 2020-10-03 DIAGNOSIS — M6281 Muscle weakness (generalized): Secondary | ICD-10-CM | POA: Diagnosis not present

## 2020-10-03 DIAGNOSIS — R29898 Other symptoms and signs involving the musculoskeletal system: Secondary | ICD-10-CM | POA: Diagnosis not present

## 2020-10-06 DIAGNOSIS — M25511 Pain in right shoulder: Secondary | ICD-10-CM | POA: Diagnosis not present

## 2020-10-06 DIAGNOSIS — R29898 Other symptoms and signs involving the musculoskeletal system: Secondary | ICD-10-CM | POA: Diagnosis not present

## 2020-10-06 DIAGNOSIS — M6281 Muscle weakness (generalized): Secondary | ICD-10-CM | POA: Diagnosis not present

## 2020-10-06 DIAGNOSIS — M7541 Impingement syndrome of right shoulder: Secondary | ICD-10-CM | POA: Diagnosis not present

## 2020-10-17 DIAGNOSIS — Z23 Encounter for immunization: Secondary | ICD-10-CM | POA: Diagnosis not present

## 2020-10-21 DIAGNOSIS — I951 Orthostatic hypotension: Secondary | ICD-10-CM | POA: Diagnosis not present

## 2020-10-21 DIAGNOSIS — N401 Enlarged prostate with lower urinary tract symptoms: Secondary | ICD-10-CM | POA: Diagnosis not present

## 2020-10-21 DIAGNOSIS — N1831 Chronic kidney disease, stage 3a: Secondary | ICD-10-CM | POA: Diagnosis not present

## 2020-10-21 DIAGNOSIS — K219 Gastro-esophageal reflux disease without esophagitis: Secondary | ICD-10-CM | POA: Diagnosis not present

## 2020-10-27 DIAGNOSIS — H401131 Primary open-angle glaucoma, bilateral, mild stage: Secondary | ICD-10-CM | POA: Diagnosis not present

## 2020-10-27 DIAGNOSIS — Z01818 Encounter for other preprocedural examination: Secondary | ICD-10-CM | POA: Diagnosis not present

## 2020-10-27 DIAGNOSIS — H25811 Combined forms of age-related cataract, right eye: Secondary | ICD-10-CM | POA: Diagnosis not present

## 2020-10-27 DIAGNOSIS — H25812 Combined forms of age-related cataract, left eye: Secondary | ICD-10-CM | POA: Diagnosis not present

## 2020-11-17 DIAGNOSIS — H25811 Combined forms of age-related cataract, right eye: Secondary | ICD-10-CM | POA: Diagnosis not present

## 2020-11-17 DIAGNOSIS — H401111 Primary open-angle glaucoma, right eye, mild stage: Secondary | ICD-10-CM | POA: Diagnosis not present

## 2020-12-01 DIAGNOSIS — H25812 Combined forms of age-related cataract, left eye: Secondary | ICD-10-CM | POA: Diagnosis not present

## 2020-12-01 DIAGNOSIS — H401120 Primary open-angle glaucoma, left eye, stage unspecified: Secondary | ICD-10-CM | POA: Diagnosis not present

## 2020-12-01 DIAGNOSIS — H401121 Primary open-angle glaucoma, left eye, mild stage: Secondary | ICD-10-CM | POA: Diagnosis not present

## 2021-01-27 ENCOUNTER — Other Ambulatory Visit: Payer: Self-pay

## 2021-01-27 ENCOUNTER — Inpatient Hospital Stay (HOSPITAL_BASED_OUTPATIENT_CLINIC_OR_DEPARTMENT_OTHER): Payer: Medicare Other | Admitting: Hematology

## 2021-01-27 ENCOUNTER — Inpatient Hospital Stay: Payer: Medicare Other | Attending: Hematology

## 2021-01-27 VITALS — BP 169/96 | HR 87 | Temp 97.2°F | Resp 18 | Wt 191.6 lb

## 2021-01-27 DIAGNOSIS — D72820 Lymphocytosis (symptomatic): Secondary | ICD-10-CM

## 2021-01-27 LAB — CBC WITH DIFFERENTIAL/PLATELET
Abs Immature Granulocytes: 0.02 10*3/uL (ref 0.00–0.07)
Basophils Absolute: 0 10*3/uL (ref 0.0–0.1)
Basophils Relative: 0 %
Eosinophils Absolute: 0.2 10*3/uL (ref 0.0–0.5)
Eosinophils Relative: 2 %
HCT: 38 % — ABNORMAL LOW (ref 39.0–52.0)
Hemoglobin: 12.7 g/dL — ABNORMAL LOW (ref 13.0–17.0)
Immature Granulocytes: 0 %
Lymphocytes Relative: 57 %
Lymphs Abs: 7 10*3/uL — ABNORMAL HIGH (ref 0.7–4.0)
MCH: 31.4 pg (ref 26.0–34.0)
MCHC: 33.4 g/dL (ref 30.0–36.0)
MCV: 93.8 fL (ref 80.0–100.0)
Monocytes Absolute: 0.5 10*3/uL (ref 0.1–1.0)
Monocytes Relative: 4 %
Neutro Abs: 4.6 10*3/uL (ref 1.7–7.7)
Neutrophils Relative %: 37 %
Platelets: 252 10*3/uL (ref 150–400)
RBC: 4.05 MIL/uL — ABNORMAL LOW (ref 4.22–5.81)
RDW: 12 % (ref 11.5–15.5)
Smear Review: NORMAL
WBC: 12.3 10*3/uL — ABNORMAL HIGH (ref 4.0–10.5)
nRBC: 0 % (ref 0.0–0.2)

## 2021-01-27 LAB — CMP (CANCER CENTER ONLY)
ALT: 8 U/L (ref 0–44)
AST: 9 U/L — ABNORMAL LOW (ref 15–41)
Albumin: 4.5 g/dL (ref 3.5–5.0)
Alkaline Phosphatase: 46 U/L (ref 38–126)
Anion gap: 8 (ref 5–15)
BUN: 22 mg/dL (ref 8–23)
CO2: 28 mmol/L (ref 22–32)
Calcium: 9.4 mg/dL (ref 8.9–10.3)
Chloride: 105 mmol/L (ref 98–111)
Creatinine: 1.21 mg/dL (ref 0.61–1.24)
GFR, Estimated: >60 mL/min (ref >60)
Glucose, Bld: 121 mg/dL — ABNORMAL HIGH (ref 70–99)
Potassium: 4 mmol/L (ref 3.5–5.1)
Sodium: 141 mmol/L (ref 135–145)
Total Bilirubin: 1.4 mg/dL — ABNORMAL HIGH (ref 0.3–1.2)
Total Protein: 6.6 g/dL (ref 6.5–8.1)

## 2021-01-27 LAB — LACTATE DEHYDROGENASE: LDH: 123 U/L (ref 98–192)

## 2021-02-02 NOTE — Progress Notes (Addendum)
HEMATOLOGY/ONCOLOGY CLINIC NOTE  Date of Service: 02/07/2021  Patient Care Team: Lajean Manes, MD as PCP - General (Internal Medicine)  CHIEF COMPLAINTS/PURPOSE OF CONSULTATION:  Follow-up for continued monitoring of monoclonal B lymphocytosis  HISTORY OF PRESENTING ILLNESS:   Bobby [name] is a wonderful 81 y.o. male who has been referred to [place] by Dr. Lajean Manes, his PCP, for evaluation and management of lymphocytosis.  He presents to the clinic today accompanied by his wife.  He notes lately he has felt low energy and low stamina. This change is not sudden but still a significant drop in the past 6 months. About 1 year prior he started to feel more presence of fatigue. He denied and lumps or bumps and palpable lymph nodes. No enlarged lymph nodes in his last CT from 2017.   On review of symptoms, pt notes significant fatigue from doing heavy yard work and walking a mile and his back now gets tired sooner. He denies SOB. He notes stable weight. He denies fever, chills, night sweats, abdominal pain, skin rash or itching or leg swelling. He denies recent viral infection.   He has been on low dose Fludrocortisone for 2-3 years for his orthostatic hypotension. He attributes this hypotension to prior alcohol use and other issues. He cut back on tylenol which he takes for osteoarthritis. No new medication change in the past 6 months.He denies iron or other vitamin deficiency. He notes he had a tight esophagus and had it stretched 5 years ago. He had gallbladder removed in 2017. Pt notes having to chew his food very well before swallowing. He was a smoker 40 years ago. He denies chemical exposures in the past. He is up to date on his cancer screenings. He has never had a blood transfusion.    INTERVAL HISTORY 01/28/2020  [name] is here for continued evaluation and management of his monoclonal B lymphocytosis.  He notes no fevers no chills no night sweats. No new lumps or  bumps. No new abdominal pain or distention. No new shortness of breath or chest pain.  He continues to have some degree of fatigue related to his orthostatic hypotension and continues to be on fludrocortisone.  His potassium levels at this time are within normal limits at 4.  Labs done today 01/27/2021 -CBC with a hemoglobin of 12.7, WBC count of 12.3k with lymphocyte count of 7k and platelets within normal limits at 252k CMP stable LDH within normal limits at 123.   MEDICAL HISTORY:  Past Medical History:  Diagnosis Date   Anxiety    Diverticulosis    Orthostatic hypotension    Tinnitus     SURGICAL HISTORY: Past Surgical History:  Procedure Laterality Date   BALLOON DILATION N/A 02/26/2012   Procedure: BALLOON DILATION;  Surgeon: Garlan Fair, MD;  Location: [facility];  Service: Endoscopy;  Laterality: N/A;   CHOLECYSTECTOMY N/A 05/03/2015   Procedure: LAPAROSCOPIC CHOLECYSTECTOMY WITH INTRAOPERATIVE CHOLANGIOGRAM , LAPAROSCOPIC GASTROTOMY  ;  Surgeon: Alphonsa Overall, MD;  Location: [facility];  Service: General;  Laterality: N/A;   ERCP N/A 05/03/2015   Procedure: ENDOSCOPIC RETROGRADE CHOLANGIOPANCREATOGRAPHY (ERCP);  Surgeon: Carol Ada, MD;  Location: [facility];  Service: Gastroenterology;  Laterality: N/A;   ESOPHAGOGASTRODUODENOSCOPY N/A 02/26/2012   Procedure: ESOPHAGOGASTRODUODENOSCOPY (EGD);  Surgeon: Garlan Fair, MD;  Location: [facility];  Service: Endoscopy;  Laterality: N/A;   ESOPHAGOGASTRODUODENOSCOPY (EGD) WITH PROPOFOL N/A 05/02/2015   Procedure: ESOPHAGOGASTRODUODENOSCOPY (EGD) WITH PROPOFOL;  Surgeon: Arta Silence, MD;  Location:  [facility];  Service: Endoscopy;  Laterality: N/A;   TONSILLECTOMY      SOCIAL HISTORY: Social History   Socioeconomic History   Marital status: Married    Spouse name: Not on file   Number of children: Not on file   Years of education: Not on file   Highest education level: Not on file  Occupational History   Not on file   Tobacco Use   Smoking status: Former    Types: Cigarettes    Quit date: 1979    Years since quitting: 44.1   Smokeless tobacco: Never  Vaping Use   Vaping Use: Never used  Substance and Sexual Activity   Alcohol use: Yes    Alcohol/week: 3.0 standard drinks    Types: 3 Shots of liquor per week    Comment: occasional   Drug use: No   Sexual activity: Not on file  Other Topics Concern   Not on file  Social History Narrative   Not on file   Social Determinants of Health   Financial Resource Strain: Not on file  Food Insecurity: Not on file  Transportation Needs: Not on file  Physical Activity: Not on file  Stress: Not on file  Social Connections: Not on file  Intimate Partner Violence: Not on file    FAMILY HISTORY: Family History  Problem Relation Age of Onset   Cancer Mother    Breast cancer Mother    Stroke Mother    Diabetes Neg Hx    Hypertension Neg Hx     ALLERGIES:  has No Known Allergies.  MEDICATIONS:  Current Outpatient Medications  Medication Sig Dispense Refill   acetaminophen (TYLENOL) 500 MG tablet Take 1,000-1,500 mg by mouth 2 (two) times daily.      cholecalciferol (VITAMIN D) 1000 units tablet Take 1,000 Units by mouth daily.     fludrocortisone (FLORINEF) 0.1 MG tablet Take 0.1 mg by mouth every morning.  0   latanoprost (XALATAN) 0.005 % ophthalmic solution Place 1 drop into both eyes at bedtime.     Multiple Vitamin (MULTIVITAMIN WITH MINERALS) TABS tablet Take 1 tablet by mouth daily.     omeprazole (PRILOSEC) 20 MG capsule Take 20 mg by mouth daily with breakfast.  2   potassium chloride SA (KLOR-CON) 20 MEQ tablet 58meg po twice daily for 2 days and then 12meq once daily thereafter. F/u with PCP in 2 week for rpt labs and further potassium adjustment 60 tablet 0   No current facility-administered medications for this visit.    REVIEW OF SYSTEMS:   .10 Point review of Systems was done is negative except as noted above.  PHYSICAL  EXAMINATION: ECOG FS:1 - Symptomatic but completely ambulatory  Vitals:   01/27/21 1018  BP: (!) 169/96  Pulse: 87  Resp: 18  Temp: (!) 97.2 F (36.2 C)  SpO2: 99%   Wt Readings from Last 3 Encounters:  01/27/21 191 lb 9.6 oz (86.9 kg)  01/29/20 193 lb 6.4 oz (87.7 kg)  01/30/19 204 lb (92.5 kg)   Body mass index is 25.99 kg/m.   Marland Kitchen GENERAL:alert, in no acute distress and comfortable SKIN: no acute rashes, no significant lesions EYES: conjunctiva are pink and non-injected, sclera anicteric OROPHARYNX: MMM, no exudates, no oropharyngeal erythema or ulceration NECK: supple, no JVD LYMPH:  no palpable lymphadenopathy in the cervical, axillary or inguinal regions LUNGS: clear to auscultation b/l with normal respiratory effort HEART: regular rate & rhythm ABDOMEN:  normoactive bowel sounds , non  tender, not distended. Extremity: no pedal edema PSYCH: alert & oriented x 3 with fluent speech NEURO: no focal motor/sensory deficits    LABORATORY DATA:  I have reviewed the data as listed  . CBC Latest Ref Rng & Units 01/27/2021 01/29/2020 01/30/2019  WBC 4.0 - 10.5 K/uL 12.3(H) 19.3(H) 11.5(H)  Hemoglobin 13.0 - 17.0 g/dL 12.7(L) 13.0 13.0  Hematocrit 39.0 - 52.0 % 38.0(L) 37.2(L) 38.8(L)  Platelets 150 - 400 K/uL 252 224 231    . CBC    Component Value Date/Time   WBC 12.3 (H) 01/27/2021 1013   RBC 4.05 (L) 01/27/2021 1013   HGB 12.7 (L) 01/27/2021 1013   HGB 13.1 03/12/2017 1355   HCT 38.0 (L) 01/27/2021 1013   PLT 252 01/27/2021 1013   PLT 235 03/12/2017 1355   MCV 93.8 01/27/2021 1013   MCH 31.4 01/27/2021 1013   MCHC 33.4 01/27/2021 1013   RDW 12.0 01/27/2021 1013   LYMPHSABS 7.0 (H) 01/27/2021 1013   MONOABS 0.5 01/27/2021 1013   EOSABS 0.2 01/27/2021 1013   BASOSABS 0.0 01/27/2021 1013     . CMP Latest Ref Rng & Units 01/27/2021 01/29/2020 01/30/2019  Glucose 70 - 99 mg/dL 121(H) 112(H) 101(H)  BUN 8 - 23 mg/dL 22 18 26(H)  Creatinine 0.61 - 1.24 mg/dL  1.21 1.29(H) 1.28(H)  Sodium 135 - 145 mmol/L 141 144 139  Potassium 3.5 - 5.1 mmol/L 4.0 2.8(L) 4.2  Chloride 98 - 111 mmol/L 105 101 105  CO2 22 - 32 mmol/L 28 32 25  Calcium 8.9 - 10.3 mg/dL 9.4 8.6(L) 8.9  Total Protein 6.5 - 8.1 g/dL 6.6 6.5 6.4(L)  Total Bilirubin 0.3 - 1.2 mg/dL 1.4(H) 1.6(H) 1.9(H)  Alkaline Phos 38 - 126 U/L 46 56 48  AST 15 - 41 U/L 9(L) 10(L) 15  ALT 0 - 44 U/L 8 7 13    . Lab Results  Component Value Date   LDH 123 01/27/2021      OUTSIDE LABS    RADIOGRAPHIC STUDIES: I have personally reviewed the radiological images as listed and agreed with the findings in the report. No results found.  ASSESSMENT & PLAN:  Quang Thorpe is a 81 y.o. caucasian male with    1.Monoclonal B Lymphocytosis vs CLL Previous labs showed 80% of all lymphocytes were clonal. Patient's lymphocyte count has now increased to 7k and presuming that clonal lymphocytes to make of 80% of the lymphocyte population the total clonal lymphocytes could be more than 5k and there is a possibility that this is trending towards CLL.  03/25/17 PET/CT revealed No hypermetabolic evidence of a lymphoproliferative process. No lymphadenopathy. Normal size spleen. No osseous hypermetabolism. Small hiatal hernia. Prominently patulous thoracic esophagus with esophageal fluid level, suggesting prominent chronic esophageal dysmotility and/or gastroesophageal reflux disease. Nonspecific circumferential wall thickening in the mid to lower thoracic esophagus, without discrete esophageal mass or hypermetabolism, differential includes reflux esophagitis, Barrett's esophagus or neoplasm. Upper endoscopic correlation may be obtained as clinically warranted. Chronic findings include: Aortic Atherosclerosis  Coronary atherosclerosis. Marked colonic diverticulosis.  2) . Patient Active Problem List   Diagnosis Date Noted   Normocytic anemia    Gallstone pancreatitis 04/30/2015   Orthostatic hypotension  04/30/2015   Pancreatitis 04/30/2015   Acute gallstone pancreatitis    PLAN: -Discussed labs today CBC shows an increase in lymphocytes to 7k which could potentially suggest more than 5000 clonal lymphocytes.  This could suggest possible early progression to CLL. -No clinical lymphadenopathy or hepatosplenomegaly. -LDH  within normal limits -No concern for symptomatic clinical progression to suggest any need for treatment consideration at this time. -Would recommend patient see his primary care physician in 6 months for monitoring CBC/lymphocyte count. -We shall see him back in 12 months unless there are significant changes on his blood counts or new clinical symptoms.  FOLLOW UP: RTC with Dr Irene Limbo with labs in 12 months  All of the patient's questions were answered with apparent satisfaction. The patient knows to call the clinic with any problems, questions or concerns.     Sullivan Lone MD [place] AAHIVMS [facility] Hematology/Oncology Physician [facility]  .Marland Kitchen

## 2021-03-28 DIAGNOSIS — N1831 Chronic kidney disease, stage 3a: Secondary | ICD-10-CM | POA: Diagnosis not present

## 2021-03-28 DIAGNOSIS — G250 Essential tremor: Secondary | ICD-10-CM | POA: Diagnosis not present

## 2021-03-28 DIAGNOSIS — K219 Gastro-esophageal reflux disease without esophagitis: Secondary | ICD-10-CM | POA: Diagnosis not present

## 2021-03-28 DIAGNOSIS — Z1389 Encounter for screening for other disorder: Secondary | ICD-10-CM | POA: Diagnosis not present

## 2021-03-28 DIAGNOSIS — D72820 Lymphocytosis (symptomatic): Secondary | ICD-10-CM | POA: Diagnosis not present

## 2021-03-28 DIAGNOSIS — Z79899 Other long term (current) drug therapy: Secondary | ICD-10-CM | POA: Diagnosis not present

## 2021-03-28 DIAGNOSIS — K909 Intestinal malabsorption, unspecified: Secondary | ICD-10-CM | POA: Diagnosis not present

## 2021-03-28 DIAGNOSIS — I700 Atherosclerosis of aorta: Secondary | ICD-10-CM | POA: Diagnosis not present

## 2021-03-28 DIAGNOSIS — N401 Enlarged prostate with lower urinary tract symptoms: Secondary | ICD-10-CM | POA: Diagnosis not present

## 2021-03-28 DIAGNOSIS — K222 Esophageal obstruction: Secondary | ICD-10-CM | POA: Diagnosis not present

## 2021-03-28 DIAGNOSIS — I951 Orthostatic hypotension: Secondary | ICD-10-CM | POA: Diagnosis not present

## 2021-03-28 DIAGNOSIS — Z0000 Encounter for general adult medical examination without abnormal findings: Secondary | ICD-10-CM | POA: Diagnosis not present

## 2021-05-09 DIAGNOSIS — D2372 Other benign neoplasm of skin of left lower limb, including hip: Secondary | ICD-10-CM | POA: Diagnosis not present

## 2021-05-09 DIAGNOSIS — D224 Melanocytic nevi of scalp and neck: Secondary | ICD-10-CM | POA: Diagnosis not present

## 2021-05-09 DIAGNOSIS — D1801 Hemangioma of skin and subcutaneous tissue: Secondary | ICD-10-CM | POA: Diagnosis not present

## 2021-05-09 DIAGNOSIS — Z85828 Personal history of other malignant neoplasm of skin: Secondary | ICD-10-CM | POA: Diagnosis not present

## 2021-05-09 DIAGNOSIS — L821 Other seborrheic keratosis: Secondary | ICD-10-CM | POA: Diagnosis not present

## 2021-05-09 DIAGNOSIS — D225 Melanocytic nevi of trunk: Secondary | ICD-10-CM | POA: Diagnosis not present

## 2021-05-09 DIAGNOSIS — D2371 Other benign neoplasm of skin of right lower limb, including hip: Secondary | ICD-10-CM | POA: Diagnosis not present

## 2021-05-19 DIAGNOSIS — Z23 Encounter for immunization: Secondary | ICD-10-CM | POA: Diagnosis not present

## 2021-07-07 DIAGNOSIS — H401131 Primary open-angle glaucoma, bilateral, mild stage: Secondary | ICD-10-CM | POA: Diagnosis not present

## 2021-09-26 DIAGNOSIS — M545 Low back pain, unspecified: Secondary | ICD-10-CM | POA: Diagnosis not present

## 2021-09-26 DIAGNOSIS — N1831 Chronic kidney disease, stage 3a: Secondary | ICD-10-CM | POA: Diagnosis not present

## 2021-09-26 DIAGNOSIS — K219 Gastro-esophageal reflux disease without esophagitis: Secondary | ICD-10-CM | POA: Diagnosis not present

## 2021-09-26 DIAGNOSIS — I951 Orthostatic hypotension: Secondary | ICD-10-CM | POA: Diagnosis not present

## 2021-09-26 DIAGNOSIS — Z23 Encounter for immunization: Secondary | ICD-10-CM | POA: Diagnosis not present

## 2021-11-02 DIAGNOSIS — Z23 Encounter for immunization: Secondary | ICD-10-CM | POA: Diagnosis not present

## 2022-01-11 DIAGNOSIS — H401131 Primary open-angle glaucoma, bilateral, mild stage: Secondary | ICD-10-CM | POA: Diagnosis not present

## 2022-01-25 ENCOUNTER — Other Ambulatory Visit: Payer: Self-pay

## 2022-01-25 DIAGNOSIS — D72820 Lymphocytosis (symptomatic): Secondary | ICD-10-CM

## 2022-01-26 ENCOUNTER — Other Ambulatory Visit: Payer: Medicare Other

## 2022-01-26 ENCOUNTER — Inpatient Hospital Stay: Payer: Medicare Other | Attending: Hematology | Admitting: Hematology

## 2022-01-26 ENCOUNTER — Other Ambulatory Visit: Payer: Self-pay

## 2022-01-26 ENCOUNTER — Inpatient Hospital Stay: Payer: Medicare Other

## 2022-01-26 VITALS — BP 139/82 | HR 87 | Temp 97.7°F | Resp 18 | Wt 181.8 lb

## 2022-01-26 DIAGNOSIS — Z809 Family history of malignant neoplasm, unspecified: Secondary | ICD-10-CM | POA: Insufficient documentation

## 2022-01-26 DIAGNOSIS — Z87891 Personal history of nicotine dependence: Secondary | ICD-10-CM | POA: Diagnosis not present

## 2022-01-26 DIAGNOSIS — Z823 Family history of stroke: Secondary | ICD-10-CM | POA: Diagnosis not present

## 2022-01-26 DIAGNOSIS — D72820 Lymphocytosis (symptomatic): Secondary | ICD-10-CM | POA: Diagnosis not present

## 2022-01-26 DIAGNOSIS — Z803 Family history of malignant neoplasm of breast: Secondary | ICD-10-CM | POA: Diagnosis not present

## 2022-01-26 DIAGNOSIS — Z79899 Other long term (current) drug therapy: Secondary | ICD-10-CM | POA: Diagnosis present

## 2022-01-26 LAB — CMP (CANCER CENTER ONLY)
ALT: 7 U/L (ref 0–44)
AST: 9 U/L — ABNORMAL LOW (ref 15–41)
Albumin: 4 g/dL (ref 3.5–5.0)
Alkaline Phosphatase: 49 U/L (ref 38–126)
Anion gap: 6 (ref 5–15)
BUN: 25 mg/dL — ABNORMAL HIGH (ref 8–23)
CO2: 30 mmol/L (ref 22–32)
Calcium: 9.3 mg/dL (ref 8.9–10.3)
Chloride: 105 mmol/L (ref 98–111)
Creatinine: 1.22 mg/dL (ref 0.61–1.24)
GFR, Estimated: 60 mL/min — ABNORMAL LOW (ref >60)
Glucose, Bld: 97 mg/dL (ref 70–99)
Potassium: 3.8 mmol/L (ref 3.5–5.1)
Sodium: 141 mmol/L (ref 135–145)
Total Bilirubin: 1.4 mg/dL — ABNORMAL HIGH (ref 0.3–1.2)
Total Protein: 6.3 g/dL — ABNORMAL LOW (ref 6.5–8.1)

## 2022-01-26 LAB — CBC WITH DIFFERENTIAL (CANCER CENTER ONLY)
Abs Immature Granulocytes: 0.03 10*3/uL (ref 0.00–0.07)
Basophils Absolute: 0 10*3/uL (ref 0.0–0.1)
Basophils Relative: 0 %
Eosinophils Absolute: 0.2 10*3/uL (ref 0.0–0.5)
Eosinophils Relative: 1 %
HCT: 36.3 % — ABNORMAL LOW (ref 39.0–52.0)
Hemoglobin: 12.5 g/dL — ABNORMAL LOW (ref 13.0–17.0)
Immature Granulocytes: 0 %
Lymphocytes Relative: 47 %
Lymphs Abs: 5.8 10*3/uL — ABNORMAL HIGH (ref 0.7–4.0)
MCH: 31.8 pg (ref 26.0–34.0)
MCHC: 34.4 g/dL (ref 30.0–36.0)
MCV: 92.4 fL (ref 80.0–100.0)
Monocytes Absolute: 0.6 10*3/uL (ref 0.1–1.0)
Monocytes Relative: 5 %
Neutro Abs: 5.9 10*3/uL (ref 1.7–7.7)
Neutrophils Relative %: 47 %
Platelet Count: 233 10*3/uL (ref 150–400)
RBC: 3.93 MIL/uL — ABNORMAL LOW (ref 4.22–5.81)
RDW: 11.9 % (ref 11.5–15.5)
Smear Review: NORMAL
WBC Count: 12.5 10*3/uL — ABNORMAL HIGH (ref 4.0–10.5)
nRBC: 0 % (ref 0.0–0.2)

## 2022-01-26 LAB — LACTATE DEHYDROGENASE: LDH: 114 U/L (ref 98–192)

## 2022-01-26 NOTE — Progress Notes (Signed)
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 male who has been referred to [place] by Dr. Lajean Manes, his PCP, for evaluation and management of lymphocytosis.  He presents to the clinic today accompanied by his wife.  He notes lately he has felt low energy and low stamina. This change is not sudden but still a significant drop in the past 6 months. About 1 year prior he started to feel more presence of fatigue. He denied and lumps or bumps and palpable lymph nodes. No enlarged lymph nodes in his last CT from 2017.   On review of symptoms, pt notes significant fatigue from doing heavy yard work and walking a mile and his back now gets tired sooner. He denies SOB. He notes stable weight. He denies fever, chills, night sweats, abdominal pain, skin rash or itching or leg swelling. He denies recent viral infection.   He has been on low dose Fludrocortisone for 2-3 years for his orthostatic hypotension. He attributes this hypotension to prior alcohol use and other issues. He cut back on tylenol which he takes for osteoarthritis. No new medication change in the past 6 months.He denies iron or other vitamin deficiency. He notes he had a tight esophagus and had it stretched 5 years ago. He had gallbladder removed in 2017. Pt notes having to chew his food very well before swallowing. He was a smoker 40 years ago. He denies chemical exposures in the past. He is up to date on his cancer screenings. He has never had a blood transfusion.    INTERVAL HISTORY   Bobby [name] is here for continued evaluation and management of his monoclonal B lymphocytosis.  Patient was last seen by me on 01/27/21 and complained of fatigue related to his  orthostatic hypotension. Otherwise, he was doing well overall with no new medical concerns.  Today, he reports that his wife is struggling with ovarian cancer, otherwise he has been doing well.  He has not had any infection issues or unexplained fever chills, or night sweats. His weight has been fairly stable. No new lumps/bumps or new enlarged lymph nodes. No change in breathing or abdominal pain. No leg swelling.  Patient is UTD on all their vaccinations, including Haslet, influenza, and COVID-19 booster. Dr. Koleen Nimrod is his new PCP. Not taking any medication for his Parkinson's disease. He has a tremor that is not affecting his movement.  MEDICAL HISTORY:  Past Medical History:  Diagnosis Date   Anxiety    Diverticulosis    Orthostatic hypotension    Tinnitus     SURGICAL HISTORY: Past Surgical History:  Procedure Laterality Date   BALLOON DILATION N/A 02/26/2012   Procedure: BALLOON DILATION;  Surgeon: Garlan Fair, MD;  Location: [facility];  Service: Endoscopy;  Laterality: N/A;   CHOLECYSTECTOMY N/A 05/03/2015   Procedure: LAPAROSCOPIC CHOLECYSTECTOMY WITH INTRAOPERATIVE CHOLANGIOGRAM , LAPAROSCOPIC GASTROTOMY  ;  Surgeon: Alphonsa Overall, MD;  Location: [facility];  Service: General;  Laterality: N/A;   ERCP N/A 05/03/2015   Procedure: ENDOSCOPIC RETROGRADE CHOLANGIOPANCREATOGRAPHY (ERCP);  Surgeon: Carol Ada, MD;  Location: [facility];  Service: Gastroenterology;  Laterality: N/A;   ESOPHAGOGASTRODUODENOSCOPY N/A 02/26/2012   Procedure: ESOPHAGOGASTRODUODENOSCOPY (EGD);  Surgeon: Garlan Fair, MD;  Location: [facility];  Service:  Endoscopy;  Laterality: N/A;   ESOPHAGOGASTRODUODENOSCOPY (EGD) WITH PROPOFOL N/A 05/02/2015   Procedure: ESOPHAGOGASTRODUODENOSCOPY (EGD) WITH PROPOFOL;  Surgeon: Arta Silence, MD;  Location: [facility];  Service: Endoscopy;  Laterality: N/A;   TONSILLECTOMY      SOCIAL HISTORY: Social History   Socioeconomic History   Marital status: Married     Spouse name: Not on file   Number of children: Not on file   Years of education: Not on file   Highest education level: Not on file  Occupational History   Not on file  Tobacco Use   Smoking status: Former    Types: Cigarettes    Quit date: 1979    Years since quitting: 45.0   Smokeless tobacco: Never  Vaping Use   Vaping Use: Never used  Substance and Sexual Activity   Alcohol use: Yes    Alcohol/week: 3.0 standard drinks of alcohol    Types: 3 Shots of liquor per week    Comment: occasional   Drug use: No   Sexual activity: Not on file  Other Topics Concern   Not on file  Social History Narrative   Not on file   Social Determinants of Health   Financial Resource Strain: Not on file  Food Insecurity: Not on file  Transportation Needs: Not on file  Physical Activity: Not on file  Stress: Not on file  Social Connections: Not on file  Intimate Partner Violence: Not on file    FAMILY HISTORY: Family History  Problem Relation Age of Onset   Cancer Mother    Breast cancer Mother    Stroke Mother    Diabetes Neg Hx    Hypertension Neg Hx     ALLERGIES:  has No Known Allergies.  MEDICATIONS:  Current Outpatient Medications  Medication Sig Dispense Refill   acetaminophen (TYLENOL) 500 MG tablet Take 1,000-1,500 mg by mouth 2 (two) times daily.      cholecalciferol (VITAMIN D) 1000 units tablet Take 1,000 Units by mouth daily.     fludrocortisone (FLORINEF) 0.1 MG tablet Take 0.1 mg by mouth every morning.  0   latanoprost (XALATAN) 0.005 % ophthalmic solution Place 1 drop into both eyes at bedtime.     Multiple Vitamin (MULTIVITAMIN WITH MINERALS) TABS tablet Take 1 tablet by mouth daily.     omeprazole (PRILOSEC) 20 MG capsule Take 20 mg by mouth daily with breakfast.  2   potassium chloride SA (KLOR-CON) 20 MEQ tablet 41mg po twice daily for 2 days and then 463m once daily thereafter. F/u with PCP in 2 week for rpt labs and further potassium adjustment 60 tablet  0   No current facility-administered medications for this visit.    REVIEW OF SYSTEMS:    10 Point review of Systems was done is negative except as noted above.   PHYSICAL EXAMINATION: ECOG FS:1 - Symptomatic but completely ambulatory  There were no vitals filed for this visit.  Wt Readings from Last 3 Encounters:  01/27/21 191 lb 9.6 oz (86.9 kg)  01/29/20 193 lb 6.4 oz (87.7 kg)  01/30/19 204 lb (92.5 kg)   There is no height or weight on file to calculate BMI.   . Marland KitchenGENERAL:alert, in no acute distress and comfortable SKIN: no acute rashes, no significant lesions EYES: conjunctiva are pink and non-injected, sclera anicteric OROPHARYNX: MMM, no exudates, no oropharyngeal erythema or ulceration NECK: supple, no JVD LYMPH:  no palpable lymphadenopathy in the cervical, axillary or inguinal regions LUNGS: clear to  auscultation b/l with normal respiratory effort HEART: regular rate & rhythm ABDOMEN:  normoactive bowel sounds , non tender, not distended. Extremity: no pedal edema PSYCH: alert & oriented x 3 with fluent speech NEURO: no focal motor/sensory deficits    LABORATORY DATA:  I have reviewed the data as listed  .    Latest Ref Rng & Units 01/27/2021   10:13 AM 01/29/2020   10:29 AM 01/30/2019   11:08 AM  CBC  WBC 4.0 - 10.5 K/uL 12.3  19.3  11.5   Hemoglobin 13.0 - 17.0 g/dL 12.7  13.0  13.0   Hematocrit 39.0 - 52.0 % 38.0  37.2  38.8   Platelets 150 - 400 K/uL 252  224  231     . CBC    Component Value Date/Time   WBC 12.3 (H) 01/27/2021 1013   RBC 4.05 (L) 01/27/2021 1013   HGB 12.7 (L) 01/27/2021 1013   HGB 13.1 03/12/2017 1355   HCT 38.0 (L) 01/27/2021 1013   PLT 252 01/27/2021 1013   PLT 235 03/12/2017 1355   MCV 93.8 01/27/2021 1013   MCH 31.4 01/27/2021 1013   MCHC 33.4 01/27/2021 1013   RDW 12.0 01/27/2021 1013   LYMPHSABS 7.0 (H) 01/27/2021 1013   MONOABS 0.5 01/27/2021 1013   EOSABS 0.2 01/27/2021 1013   BASOSABS 0.0 01/27/2021 1013      .    Latest Ref Rng & Units 01/27/2021   10:13 AM 01/29/2020   10:29 AM 01/30/2019   11:08 AM  CMP  Glucose 70 - 99 mg/dL 121  112  101   BUN 8 - 23 mg/dL '22  18  26   '$ Creatinine 0.61 - 1.24 mg/dL 1.21  1.29  1.28   Sodium 135 - 145 mmol/L 141  144  139   Potassium 3.5 - 5.1 mmol/L 4.0  2.8  4.2   Chloride 98 - 111 mmol/L 105  101  105   CO2 22 - 32 mmol/L 28  32  25   Calcium 8.9 - 10.3 mg/dL 9.4  8.6  8.9   Total Protein 6.5 - 8.1 g/dL 6.6  6.5  6.4   Total Bilirubin 0.3 - 1.2 mg/dL 1.4  1.6  1.9   Alkaline Phos 38 - 126 U/L 46  56  48   AST 15 - 41 U/L '9  10  15   '$ ALT 0 - 44 U/L '8  7  13    '$ . Lab Results  Component Value Date   LDH 123 01/27/2021      OUTSIDE LABS    RADIOGRAPHIC STUDIES: I have personally reviewed the radiological images as listed and agreed with the findings in the report. No results found.  ASSESSMENT & PLAN:  Preston Garabedian is a 82 y.o. caucasian male with    1.Monoclonal B Lymphocytosis vs CLL Previous labs showed 80% of all lymphocytes were clonal. Patient's lymphocyte count has now increased to 7k and presuming that clonal lymphocytes to make of 80% of the lymphocyte population the total clonal lymphocytes could be more than 5k and there is a possibility that this is trending towards CLL.  03/25/17 PET/CT revealed No hypermetabolic evidence of a lymphoproliferative process. No lymphadenopathy. Normal size spleen. No osseous hypermetabolism. Small hiatal hernia. Prominently patulous thoracic esophagus with esophageal fluid level, suggesting prominent chronic esophageal dysmotility and/or gastroesophageal reflux disease. Nonspecific circumferential wall thickening in the mid to lower thoracic esophagus, without discrete esophageal mass or hypermetabolism, differential includes reflux  esophagitis, Barrett's esophagus or neoplasm. Upper endoscopic correlation may be obtained as clinically warranted. Chronic findings include: Aortic  Atherosclerosis  Coronary atherosclerosis. Marked colonic diverticulosis.  2) . Patient Active Problem List   Diagnosis Date Noted   Normocytic anemia    Gallstone pancreatitis 04/30/2015   Orthostatic hypotension 04/30/2015   Pancreatitis 04/30/2015   Acute gallstone pancreatitis    PLAN: -discussed lab results on 01/26/22 with patient. CBC showed WBC of 12.5 K, hemoglobin of 12.5 K, and platelets of 233 K. CMP stable. -Few extra lymphocytes: 7K -LDH pending -no new changes in regards to monoclonal B lymphocytosis  FOLLOW UP: RTC with Dr Irene Limbo with labs in 12 months  The total time spent in the appointment was *** minutes* .  All of the patient's questions were answered with apparent satisfaction. The patient knows to call the clinic with any problems, questions or concerns.   Sullivan Lone MD MS AAHIVMS [facility] Hematology/Oncology Physician [facility]  .*Total Encounter Time as defined by the Centers for Medicare and Medicaid Services includes, in addition to the face-to-face time of a patient visit (documented in the note above) non-face-to-face time: obtaining and reviewing outside history, ordering and reviewing medications, tests or procedures, care coordination (communications with other health care professionals or caregivers) and documentation in the medical record.   I,Mitra Faeizi,acting as a Education administrator for Sullivan Lone, MD.,have documented all relevant documentation on the behalf of Sullivan Lone, MD,as directed by  Sullivan Lone, MD while in the presence of Sullivan Lone, MD.   ***   .Marland Kitchen

## 2022-01-29 ENCOUNTER — Telehealth: Payer: Self-pay | Admitting: Hematology

## 2022-01-29 NOTE — Telephone Encounter (Signed)
Called patient to schedule f/u per 1/26 los notes. Patient scheduled and notified.

## 2022-04-03 DIAGNOSIS — M79672 Pain in left foot: Secondary | ICD-10-CM | POA: Diagnosis not present

## 2022-04-03 DIAGNOSIS — L84 Corns and callosities: Secondary | ICD-10-CM | POA: Diagnosis not present

## 2022-04-03 DIAGNOSIS — M216X2 Other acquired deformities of left foot: Secondary | ICD-10-CM | POA: Diagnosis not present

## 2022-05-22 DIAGNOSIS — D1801 Hemangioma of skin and subcutaneous tissue: Secondary | ICD-10-CM | POA: Diagnosis not present

## 2022-05-22 DIAGNOSIS — Z85828 Personal history of other malignant neoplasm of skin: Secondary | ICD-10-CM | POA: Diagnosis not present

## 2022-05-22 DIAGNOSIS — D225 Melanocytic nevi of trunk: Secondary | ICD-10-CM | POA: Diagnosis not present

## 2022-05-22 DIAGNOSIS — L812 Freckles: Secondary | ICD-10-CM | POA: Diagnosis not present

## 2022-05-22 DIAGNOSIS — L821 Other seborrheic keratosis: Secondary | ICD-10-CM | POA: Diagnosis not present

## 2022-06-20 DIAGNOSIS — K909 Intestinal malabsorption, unspecified: Secondary | ICD-10-CM | POA: Diagnosis not present

## 2022-06-20 DIAGNOSIS — Z79899 Other long term (current) drug therapy: Secondary | ICD-10-CM | POA: Diagnosis not present

## 2022-06-20 DIAGNOSIS — D72820 Lymphocytosis (symptomatic): Secondary | ICD-10-CM | POA: Diagnosis not present

## 2022-06-20 DIAGNOSIS — I951 Orthostatic hypotension: Secondary | ICD-10-CM | POA: Diagnosis not present

## 2022-06-20 DIAGNOSIS — K222 Esophageal obstruction: Secondary | ICD-10-CM | POA: Diagnosis not present

## 2022-06-20 DIAGNOSIS — N1831 Chronic kidney disease, stage 3a: Secondary | ICD-10-CM | POA: Diagnosis not present

## 2022-06-20 DIAGNOSIS — Z0000 Encounter for general adult medical examination without abnormal findings: Secondary | ICD-10-CM | POA: Diagnosis not present

## 2022-06-20 DIAGNOSIS — R5383 Other fatigue: Secondary | ICD-10-CM | POA: Diagnosis not present

## 2022-06-20 DIAGNOSIS — R29898 Other symptoms and signs involving the musculoskeletal system: Secondary | ICD-10-CM | POA: Diagnosis not present

## 2022-06-20 DIAGNOSIS — N401 Enlarged prostate with lower urinary tract symptoms: Secondary | ICD-10-CM | POA: Diagnosis not present

## 2022-06-20 DIAGNOSIS — G250 Essential tremor: Secondary | ICD-10-CM | POA: Diagnosis not present

## 2022-06-20 DIAGNOSIS — R1319 Other dysphagia: Secondary | ICD-10-CM | POA: Diagnosis not present

## 2022-06-20 DIAGNOSIS — I700 Atherosclerosis of aorta: Secondary | ICD-10-CM | POA: Diagnosis not present

## 2022-07-04 DIAGNOSIS — M5459 Other low back pain: Secondary | ICD-10-CM | POA: Diagnosis not present

## 2022-07-04 DIAGNOSIS — R293 Abnormal posture: Secondary | ICD-10-CM | POA: Diagnosis not present

## 2022-07-04 DIAGNOSIS — M6281 Muscle weakness (generalized): Secondary | ICD-10-CM | POA: Diagnosis not present

## 2022-07-05 DIAGNOSIS — R293 Abnormal posture: Secondary | ICD-10-CM | POA: Diagnosis not present

## 2022-07-05 DIAGNOSIS — M5459 Other low back pain: Secondary | ICD-10-CM | POA: Diagnosis not present

## 2022-07-05 DIAGNOSIS — M6281 Muscle weakness (generalized): Secondary | ICD-10-CM | POA: Diagnosis not present

## 2022-07-09 DIAGNOSIS — R293 Abnormal posture: Secondary | ICD-10-CM | POA: Diagnosis not present

## 2022-07-09 DIAGNOSIS — M6281 Muscle weakness (generalized): Secondary | ICD-10-CM | POA: Diagnosis not present

## 2022-07-09 DIAGNOSIS — M5459 Other low back pain: Secondary | ICD-10-CM | POA: Diagnosis not present

## 2022-07-11 DIAGNOSIS — M5459 Other low back pain: Secondary | ICD-10-CM | POA: Diagnosis not present

## 2022-07-11 DIAGNOSIS — R293 Abnormal posture: Secondary | ICD-10-CM | POA: Diagnosis not present

## 2022-07-11 DIAGNOSIS — M6281 Muscle weakness (generalized): Secondary | ICD-10-CM | POA: Diagnosis not present

## 2022-07-12 DIAGNOSIS — H401131 Primary open-angle glaucoma, bilateral, mild stage: Secondary | ICD-10-CM | POA: Diagnosis not present

## 2022-07-13 DIAGNOSIS — R293 Abnormal posture: Secondary | ICD-10-CM | POA: Diagnosis not present

## 2022-07-13 DIAGNOSIS — M5459 Other low back pain: Secondary | ICD-10-CM | POA: Diagnosis not present

## 2022-07-13 DIAGNOSIS — M6281 Muscle weakness (generalized): Secondary | ICD-10-CM | POA: Diagnosis not present

## 2022-07-16 DIAGNOSIS — R293 Abnormal posture: Secondary | ICD-10-CM | POA: Diagnosis not present

## 2022-07-16 DIAGNOSIS — M6281 Muscle weakness (generalized): Secondary | ICD-10-CM | POA: Diagnosis not present

## 2022-07-16 DIAGNOSIS — M5459 Other low back pain: Secondary | ICD-10-CM | POA: Diagnosis not present

## 2022-07-18 DIAGNOSIS — M5459 Other low back pain: Secondary | ICD-10-CM | POA: Diagnosis not present

## 2022-07-18 DIAGNOSIS — R293 Abnormal posture: Secondary | ICD-10-CM | POA: Diagnosis not present

## 2022-07-18 DIAGNOSIS — M6281 Muscle weakness (generalized): Secondary | ICD-10-CM | POA: Diagnosis not present

## 2022-07-20 DIAGNOSIS — R293 Abnormal posture: Secondary | ICD-10-CM | POA: Diagnosis not present

## 2022-07-20 DIAGNOSIS — M6281 Muscle weakness (generalized): Secondary | ICD-10-CM | POA: Diagnosis not present

## 2022-07-20 DIAGNOSIS — M5459 Other low back pain: Secondary | ICD-10-CM | POA: Diagnosis not present

## 2022-07-23 DIAGNOSIS — R293 Abnormal posture: Secondary | ICD-10-CM | POA: Diagnosis not present

## 2022-07-23 DIAGNOSIS — M6281 Muscle weakness (generalized): Secondary | ICD-10-CM | POA: Diagnosis not present

## 2022-07-23 DIAGNOSIS — M5459 Other low back pain: Secondary | ICD-10-CM | POA: Diagnosis not present

## 2022-07-25 DIAGNOSIS — M5459 Other low back pain: Secondary | ICD-10-CM | POA: Diagnosis not present

## 2022-07-25 DIAGNOSIS — R293 Abnormal posture: Secondary | ICD-10-CM | POA: Diagnosis not present

## 2022-07-25 DIAGNOSIS — M6281 Muscle weakness (generalized): Secondary | ICD-10-CM | POA: Diagnosis not present

## 2022-07-27 DIAGNOSIS — R293 Abnormal posture: Secondary | ICD-10-CM | POA: Diagnosis not present

## 2022-07-27 DIAGNOSIS — M5459 Other low back pain: Secondary | ICD-10-CM | POA: Diagnosis not present

## 2022-07-27 DIAGNOSIS — M6281 Muscle weakness (generalized): Secondary | ICD-10-CM | POA: Diagnosis not present

## 2022-07-30 DIAGNOSIS — M6281 Muscle weakness (generalized): Secondary | ICD-10-CM | POA: Diagnosis not present

## 2022-07-30 DIAGNOSIS — M5459 Other low back pain: Secondary | ICD-10-CM | POA: Diagnosis not present

## 2022-07-30 DIAGNOSIS — R293 Abnormal posture: Secondary | ICD-10-CM | POA: Diagnosis not present

## 2022-08-08 DIAGNOSIS — M5459 Other low back pain: Secondary | ICD-10-CM | POA: Diagnosis not present

## 2022-08-08 DIAGNOSIS — M25511 Pain in right shoulder: Secondary | ICD-10-CM | POA: Diagnosis not present

## 2022-08-08 DIAGNOSIS — R293 Abnormal posture: Secondary | ICD-10-CM | POA: Diagnosis not present

## 2022-08-08 DIAGNOSIS — M6281 Muscle weakness (generalized): Secondary | ICD-10-CM | POA: Diagnosis not present

## 2022-08-10 DIAGNOSIS — R293 Abnormal posture: Secondary | ICD-10-CM | POA: Diagnosis not present

## 2022-08-10 DIAGNOSIS — M6281 Muscle weakness (generalized): Secondary | ICD-10-CM | POA: Diagnosis not present

## 2022-08-10 DIAGNOSIS — M25511 Pain in right shoulder: Secondary | ICD-10-CM | POA: Diagnosis not present

## 2022-08-10 DIAGNOSIS — M5459 Other low back pain: Secondary | ICD-10-CM | POA: Diagnosis not present

## 2022-08-13 DIAGNOSIS — M5459 Other low back pain: Secondary | ICD-10-CM | POA: Diagnosis not present

## 2022-08-13 DIAGNOSIS — R293 Abnormal posture: Secondary | ICD-10-CM | POA: Diagnosis not present

## 2022-08-13 DIAGNOSIS — M25511 Pain in right shoulder: Secondary | ICD-10-CM | POA: Diagnosis not present

## 2022-08-13 DIAGNOSIS — M6281 Muscle weakness (generalized): Secondary | ICD-10-CM | POA: Diagnosis not present

## 2022-08-15 DIAGNOSIS — M6281 Muscle weakness (generalized): Secondary | ICD-10-CM | POA: Diagnosis not present

## 2022-08-15 DIAGNOSIS — M5459 Other low back pain: Secondary | ICD-10-CM | POA: Diagnosis not present

## 2022-08-15 DIAGNOSIS — M25511 Pain in right shoulder: Secondary | ICD-10-CM | POA: Diagnosis not present

## 2022-08-15 DIAGNOSIS — R293 Abnormal posture: Secondary | ICD-10-CM | POA: Diagnosis not present

## 2022-08-17 DIAGNOSIS — R293 Abnormal posture: Secondary | ICD-10-CM | POA: Diagnosis not present

## 2022-08-17 DIAGNOSIS — M5459 Other low back pain: Secondary | ICD-10-CM | POA: Diagnosis not present

## 2022-08-17 DIAGNOSIS — M25511 Pain in right shoulder: Secondary | ICD-10-CM | POA: Diagnosis not present

## 2022-08-17 DIAGNOSIS — M6281 Muscle weakness (generalized): Secondary | ICD-10-CM | POA: Diagnosis not present

## 2022-08-20 DIAGNOSIS — M5459 Other low back pain: Secondary | ICD-10-CM | POA: Diagnosis not present

## 2022-08-20 DIAGNOSIS — M25511 Pain in right shoulder: Secondary | ICD-10-CM | POA: Diagnosis not present

## 2022-08-20 DIAGNOSIS — M6281 Muscle weakness (generalized): Secondary | ICD-10-CM | POA: Diagnosis not present

## 2022-08-20 DIAGNOSIS — R293 Abnormal posture: Secondary | ICD-10-CM | POA: Diagnosis not present

## 2022-08-22 DIAGNOSIS — R293 Abnormal posture: Secondary | ICD-10-CM | POA: Diagnosis not present

## 2022-08-22 DIAGNOSIS — M25511 Pain in right shoulder: Secondary | ICD-10-CM | POA: Diagnosis not present

## 2022-08-22 DIAGNOSIS — M5459 Other low back pain: Secondary | ICD-10-CM | POA: Diagnosis not present

## 2022-08-22 DIAGNOSIS — M6281 Muscle weakness (generalized): Secondary | ICD-10-CM | POA: Diagnosis not present

## 2022-08-24 DIAGNOSIS — M6281 Muscle weakness (generalized): Secondary | ICD-10-CM | POA: Diagnosis not present

## 2022-08-24 DIAGNOSIS — M5459 Other low back pain: Secondary | ICD-10-CM | POA: Diagnosis not present

## 2022-08-24 DIAGNOSIS — M25511 Pain in right shoulder: Secondary | ICD-10-CM | POA: Diagnosis not present

## 2022-08-24 DIAGNOSIS — R293 Abnormal posture: Secondary | ICD-10-CM | POA: Diagnosis not present

## 2022-08-27 DIAGNOSIS — M5459 Other low back pain: Secondary | ICD-10-CM | POA: Diagnosis not present

## 2022-08-27 DIAGNOSIS — M25511 Pain in right shoulder: Secondary | ICD-10-CM | POA: Diagnosis not present

## 2022-08-27 DIAGNOSIS — R293 Abnormal posture: Secondary | ICD-10-CM | POA: Diagnosis not present

## 2022-08-27 DIAGNOSIS — M6281 Muscle weakness (generalized): Secondary | ICD-10-CM | POA: Diagnosis not present

## 2022-08-28 ENCOUNTER — Other Ambulatory Visit: Payer: Self-pay | Admitting: Nurse Practitioner

## 2022-08-28 DIAGNOSIS — R1310 Dysphagia, unspecified: Secondary | ICD-10-CM | POA: Diagnosis not present

## 2022-08-28 DIAGNOSIS — K219 Gastro-esophageal reflux disease without esophagitis: Secondary | ICD-10-CM | POA: Diagnosis not present

## 2022-08-29 DIAGNOSIS — R293 Abnormal posture: Secondary | ICD-10-CM | POA: Diagnosis not present

## 2022-08-29 DIAGNOSIS — M5459 Other low back pain: Secondary | ICD-10-CM | POA: Diagnosis not present

## 2022-08-29 DIAGNOSIS — M6281 Muscle weakness (generalized): Secondary | ICD-10-CM | POA: Diagnosis not present

## 2022-08-29 DIAGNOSIS — M25511 Pain in right shoulder: Secondary | ICD-10-CM | POA: Diagnosis not present

## 2022-08-30 ENCOUNTER — Ambulatory Visit
Admission: RE | Admit: 2022-08-30 | Discharge: 2022-08-30 | Disposition: A | Discharge status: Home / Self Care | Payer: Medicare Other | Source: Ambulatory Visit | Attending: Nurse Practitioner | Admitting: Nurse Practitioner

## 2022-08-30 DIAGNOSIS — R1310 Dysphagia, unspecified: Secondary | ICD-10-CM | POA: Diagnosis not present

## 2022-08-31 DIAGNOSIS — M25511 Pain in right shoulder: Secondary | ICD-10-CM | POA: Diagnosis not present

## 2022-08-31 DIAGNOSIS — R293 Abnormal posture: Secondary | ICD-10-CM | POA: Diagnosis not present

## 2022-08-31 DIAGNOSIS — M6281 Muscle weakness (generalized): Secondary | ICD-10-CM | POA: Diagnosis not present

## 2022-08-31 DIAGNOSIS — M5459 Other low back pain: Secondary | ICD-10-CM | POA: Diagnosis not present

## 2022-09-03 DIAGNOSIS — M6281 Muscle weakness (generalized): Secondary | ICD-10-CM | POA: Diagnosis not present

## 2022-09-03 DIAGNOSIS — M5459 Other low back pain: Secondary | ICD-10-CM | POA: Diagnosis not present

## 2022-09-03 DIAGNOSIS — R293 Abnormal posture: Secondary | ICD-10-CM | POA: Diagnosis not present

## 2022-09-03 DIAGNOSIS — M25511 Pain in right shoulder: Secondary | ICD-10-CM | POA: Diagnosis not present

## 2022-09-05 DIAGNOSIS — M5459 Other low back pain: Secondary | ICD-10-CM | POA: Diagnosis not present

## 2022-09-05 DIAGNOSIS — M25511 Pain in right shoulder: Secondary | ICD-10-CM | POA: Diagnosis not present

## 2022-09-05 DIAGNOSIS — R293 Abnormal posture: Secondary | ICD-10-CM | POA: Diagnosis not present

## 2022-09-05 DIAGNOSIS — M6281 Muscle weakness (generalized): Secondary | ICD-10-CM | POA: Diagnosis not present

## 2022-09-07 DIAGNOSIS — M5459 Other low back pain: Secondary | ICD-10-CM | POA: Diagnosis not present

## 2022-09-07 DIAGNOSIS — R293 Abnormal posture: Secondary | ICD-10-CM | POA: Diagnosis not present

## 2022-09-07 DIAGNOSIS — M25511 Pain in right shoulder: Secondary | ICD-10-CM | POA: Diagnosis not present

## 2022-09-07 DIAGNOSIS — M6281 Muscle weakness (generalized): Secondary | ICD-10-CM | POA: Diagnosis not present

## 2022-09-10 DIAGNOSIS — R293 Abnormal posture: Secondary | ICD-10-CM | POA: Diagnosis not present

## 2022-09-10 DIAGNOSIS — M6281 Muscle weakness (generalized): Secondary | ICD-10-CM | POA: Diagnosis not present

## 2022-09-10 DIAGNOSIS — M25511 Pain in right shoulder: Secondary | ICD-10-CM | POA: Diagnosis not present

## 2022-09-10 DIAGNOSIS — M5459 Other low back pain: Secondary | ICD-10-CM | POA: Diagnosis not present

## 2022-09-12 DIAGNOSIS — M25511 Pain in right shoulder: Secondary | ICD-10-CM | POA: Diagnosis not present

## 2022-09-12 DIAGNOSIS — M6281 Muscle weakness (generalized): Secondary | ICD-10-CM | POA: Diagnosis not present

## 2022-09-12 DIAGNOSIS — M5459 Other low back pain: Secondary | ICD-10-CM | POA: Diagnosis not present

## 2022-09-12 DIAGNOSIS — R293 Abnormal posture: Secondary | ICD-10-CM | POA: Diagnosis not present

## 2022-09-14 DIAGNOSIS — M6281 Muscle weakness (generalized): Secondary | ICD-10-CM | POA: Diagnosis not present

## 2022-09-14 DIAGNOSIS — M25511 Pain in right shoulder: Secondary | ICD-10-CM | POA: Diagnosis not present

## 2022-09-14 DIAGNOSIS — R293 Abnormal posture: Secondary | ICD-10-CM | POA: Diagnosis not present

## 2022-09-14 DIAGNOSIS — M5459 Other low back pain: Secondary | ICD-10-CM | POA: Diagnosis not present

## 2022-09-17 DIAGNOSIS — M6281 Muscle weakness (generalized): Secondary | ICD-10-CM | POA: Diagnosis not present

## 2022-09-17 DIAGNOSIS — M25511 Pain in right shoulder: Secondary | ICD-10-CM | POA: Diagnosis not present

## 2022-09-17 DIAGNOSIS — R293 Abnormal posture: Secondary | ICD-10-CM | POA: Diagnosis not present

## 2022-09-17 DIAGNOSIS — M5459 Other low back pain: Secondary | ICD-10-CM | POA: Diagnosis not present

## 2022-09-19 DIAGNOSIS — M6281 Muscle weakness (generalized): Secondary | ICD-10-CM | POA: Diagnosis not present

## 2022-09-19 DIAGNOSIS — M25511 Pain in right shoulder: Secondary | ICD-10-CM | POA: Diagnosis not present

## 2022-09-19 DIAGNOSIS — M5459 Other low back pain: Secondary | ICD-10-CM | POA: Diagnosis not present

## 2022-09-19 DIAGNOSIS — R293 Abnormal posture: Secondary | ICD-10-CM | POA: Diagnosis not present

## 2022-09-21 DIAGNOSIS — M25511 Pain in right shoulder: Secondary | ICD-10-CM | POA: Diagnosis not present

## 2022-09-21 DIAGNOSIS — R293 Abnormal posture: Secondary | ICD-10-CM | POA: Diagnosis not present

## 2022-09-21 DIAGNOSIS — M6281 Muscle weakness (generalized): Secondary | ICD-10-CM | POA: Diagnosis not present

## 2022-09-21 DIAGNOSIS — M5459 Other low back pain: Secondary | ICD-10-CM | POA: Diagnosis not present

## 2022-09-24 DIAGNOSIS — R293 Abnormal posture: Secondary | ICD-10-CM | POA: Diagnosis not present

## 2022-09-24 DIAGNOSIS — M6281 Muscle weakness (generalized): Secondary | ICD-10-CM | POA: Diagnosis not present

## 2022-09-24 DIAGNOSIS — M25511 Pain in right shoulder: Secondary | ICD-10-CM | POA: Diagnosis not present

## 2022-09-24 DIAGNOSIS — M5459 Other low back pain: Secondary | ICD-10-CM | POA: Diagnosis not present

## 2022-09-26 DIAGNOSIS — M6281 Muscle weakness (generalized): Secondary | ICD-10-CM | POA: Diagnosis not present

## 2022-09-26 DIAGNOSIS — R293 Abnormal posture: Secondary | ICD-10-CM | POA: Diagnosis not present

## 2022-09-26 DIAGNOSIS — M25511 Pain in right shoulder: Secondary | ICD-10-CM | POA: Diagnosis not present

## 2022-09-26 DIAGNOSIS — M5459 Other low back pain: Secondary | ICD-10-CM | POA: Diagnosis not present

## 2022-09-28 DIAGNOSIS — M25511 Pain in right shoulder: Secondary | ICD-10-CM | POA: Diagnosis not present

## 2022-09-28 DIAGNOSIS — M6281 Muscle weakness (generalized): Secondary | ICD-10-CM | POA: Diagnosis not present

## 2022-09-28 DIAGNOSIS — R293 Abnormal posture: Secondary | ICD-10-CM | POA: Diagnosis not present

## 2022-09-28 DIAGNOSIS — M5459 Other low back pain: Secondary | ICD-10-CM | POA: Diagnosis not present

## 2022-10-01 DIAGNOSIS — R293 Abnormal posture: Secondary | ICD-10-CM | POA: Diagnosis not present

## 2022-10-01 DIAGNOSIS — M6281 Muscle weakness (generalized): Secondary | ICD-10-CM | POA: Diagnosis not present

## 2022-10-01 DIAGNOSIS — M25511 Pain in right shoulder: Secondary | ICD-10-CM | POA: Diagnosis not present

## 2022-10-01 DIAGNOSIS — M5459 Other low back pain: Secondary | ICD-10-CM | POA: Diagnosis not present

## 2022-10-03 DIAGNOSIS — M25511 Pain in right shoulder: Secondary | ICD-10-CM | POA: Diagnosis not present

## 2022-10-03 DIAGNOSIS — M6281 Muscle weakness (generalized): Secondary | ICD-10-CM | POA: Diagnosis not present

## 2022-10-03 DIAGNOSIS — R293 Abnormal posture: Secondary | ICD-10-CM | POA: Diagnosis not present

## 2022-10-05 ENCOUNTER — Other Ambulatory Visit: Payer: Self-pay

## 2022-10-05 ENCOUNTER — Inpatient Hospital Stay (HOSPITAL_COMMUNITY)
Admission: EM | Admit: 2022-10-05 | Discharge: 2022-10-09 | DRG: 312 | Disposition: A | Discharge status: Home / Self Care | Payer: Medicare Other | Attending: Internal Medicine | Admitting: Internal Medicine

## 2022-10-05 ENCOUNTER — Encounter (HOSPITAL_COMMUNITY): Payer: Self-pay | Admitting: Emergency Medicine

## 2022-10-05 DIAGNOSIS — R42 Dizziness and giddiness: Secondary | ICD-10-CM | POA: Diagnosis not present

## 2022-10-05 DIAGNOSIS — N289 Disorder of kidney and ureter, unspecified: Secondary | ICD-10-CM | POA: Diagnosis not present

## 2022-10-05 DIAGNOSIS — R55 Syncope and collapse: Secondary | ICD-10-CM | POA: Diagnosis not present

## 2022-10-05 DIAGNOSIS — I951 Orthostatic hypotension: Secondary | ICD-10-CM | POA: Diagnosis not present

## 2022-10-05 DIAGNOSIS — D649 Anemia, unspecified: Secondary | ICD-10-CM | POA: Diagnosis present

## 2022-10-05 DIAGNOSIS — R293 Abnormal posture: Secondary | ICD-10-CM | POA: Diagnosis not present

## 2022-10-05 DIAGNOSIS — Z66 Do not resuscitate: Secondary | ICD-10-CM | POA: Diagnosis present

## 2022-10-05 DIAGNOSIS — Z79899 Other long term (current) drug therapy: Secondary | ICD-10-CM

## 2022-10-05 DIAGNOSIS — D696 Thrombocytopenia, unspecified: Secondary | ICD-10-CM | POA: Diagnosis present

## 2022-10-05 DIAGNOSIS — I129 Hypertensive chronic kidney disease with stage 1 through stage 4 chronic kidney disease, or unspecified chronic kidney disease: Secondary | ICD-10-CM | POA: Diagnosis present

## 2022-10-05 DIAGNOSIS — M6281 Muscle weakness (generalized): Secondary | ICD-10-CM | POA: Diagnosis not present

## 2022-10-05 DIAGNOSIS — D72829 Elevated white blood cell count, unspecified: Secondary | ICD-10-CM | POA: Diagnosis not present

## 2022-10-05 DIAGNOSIS — N1831 Chronic kidney disease, stage 3a: Secondary | ICD-10-CM | POA: Diagnosis not present

## 2022-10-05 DIAGNOSIS — F419 Anxiety disorder, unspecified: Secondary | ICD-10-CM | POA: Diagnosis present

## 2022-10-05 DIAGNOSIS — I10 Essential (primary) hypertension: Secondary | ICD-10-CM | POA: Diagnosis not present

## 2022-10-05 DIAGNOSIS — Z87891 Personal history of nicotine dependence: Secondary | ICD-10-CM | POA: Diagnosis not present

## 2022-10-05 DIAGNOSIS — M25511 Pain in right shoulder: Secondary | ICD-10-CM | POA: Diagnosis not present

## 2022-10-05 LAB — BASIC METABOLIC PANEL
Anion gap: 11 (ref 5–15)
BUN: 31 mg/dL — ABNORMAL HIGH (ref 8–23)
CO2: 21 mmol/L — ABNORMAL LOW (ref 22–32)
Calcium: 9.3 mg/dL (ref 8.9–10.3)
Chloride: 105 mmol/L (ref 98–111)
Creatinine, Ser: 1.38 mg/dL — ABNORMAL HIGH (ref 0.61–1.24)
GFR, Estimated: 52 mL/min — ABNORMAL LOW (ref >60)
Glucose, Bld: 114 mg/dL — ABNORMAL HIGH (ref 70–99)
Potassium: 5 mmol/L (ref 3.5–5.1)
Sodium: 137 mmol/L (ref 135–145)

## 2022-10-05 LAB — CBC
HCT: 38.1 % — ABNORMAL LOW (ref 39.0–52.0)
Hemoglobin: 12.2 g/dL — ABNORMAL LOW (ref 13.0–17.0)
MCH: 31.9 pg (ref 26.0–34.0)
MCHC: 32 g/dL (ref 30.0–36.0)
MCV: 99.5 fL (ref 80.0–100.0)
Platelets: 97 10*3/uL — ABNORMAL LOW (ref 150–400)
RBC: 3.83 MIL/uL — ABNORMAL LOW (ref 4.22–5.81)
RDW: 12 % (ref 11.5–15.5)
WBC: 11.4 10*3/uL — ABNORMAL HIGH (ref 4.0–10.5)
nRBC: 0 % (ref 0.0–0.2)

## 2022-10-05 LAB — URINALYSIS, ROUTINE W REFLEX MICROSCOPIC
Bacteria, UA: NONE SEEN
Bilirubin Urine: NEGATIVE
Glucose, UA: NEGATIVE mg/dL
Hgb urine dipstick: NEGATIVE
Ketones, ur: 5 mg/dL — AB
Leukocytes,Ua: NEGATIVE
Nitrite: NEGATIVE
Protein, ur: 30 mg/dL — AB
Specific Gravity, Urine: 1.028 (ref 1.005–1.030)
pH: 5 (ref 5.0–8.0)

## 2022-10-05 LAB — CBG MONITORING, ED: Glucose-Capillary: 94 mg/dL (ref 70–99)

## 2022-10-05 NOTE — ED Provider Notes (Signed)
[place] EMERGENCY DEPARTMENT AT [facility] Provider Note   CSN: [id number] Arrival date & time: 10/05/22  1745     History {Add pertinent medical, surgical, social history, OB history to HPI:1} Chief Complaint  Patient presents with   Dizziness    Bobby [name] is a 82 y.o. male.  The history is provided by the patient.  Dizziness He has history of hypertension and comes in because of an episode of near syncope.  He states that he had been driving in his car for period of time and got out to walk into the mail room at the facility where he lives and he had an episode of near syncope.  This was witnessed by a nurse.  He did not actually lose consciousness.  He denies chest pain, heaviness, tightness, pressure.  He denies room spinning or nausea or dyspnea or diaphoresis.  He does have a history of orthostatic hypotension and does state that his blood pressure medications were adjusted recently although he cannot give me specifics.  He was still feeling a little lightheaded when he got to the hospital, but feels like he is back to his baseline.   Home Medications Prior to Admission medications   Medication Sig Start Date End Date Taking? Authorizing Provider  acetaminophen (TYLENOL) 500 MG tablet Take 1,000-1,500 mg by mouth 2 (two) times daily.   Yes [provider]  amLODipine (NORVASC) 5 MG tablet Take 5 mg by mouth daily. 09/25/22  Yes [provider]  cholecalciferol (VITAMIN D) 1000 units tablet Take 1,000 Units by mouth daily.   Yes [provider]  fludrocortisone (FLORINEF) 0.1 MG tablet Take 0.1 mg by mouth every morning. 03/30/15  Yes [provider]  Multiple Vitamin (MULTIVITAMIN WITH MINERALS) TABS tablet Take 1 tablet by mouth daily.   Yes [provider]  omeprazole (PRILOSEC) 20 MG capsule Take 20 mg by mouth daily with breakfast. 04/25/15  Yes [provider]  potassium chloride SA (KLOR-CON) 20 MEQ tablet  po twice daily for 2 days and then once daily thereafter. F/u with PCP in 2 week for rpt labs and further potassium adjustment 01/29/20  Yes Johney Maine, MD  latanoprost (XALATAN) 0.005 % ophthalmic solution Place 1 drop into both eyes at bedtime. Patient not taking: Reported on 10/05/2022    [provider]      Allergies    Patient has no known allergies.    Review of Systems   Review of Systems  Neurological:  Positive for dizziness.  All other systems reviewed and are negative.   Physical Exam Updated Vital Signs BP (!) 158/88   Pulse 70   Temp 98.6 F (37 C) (Oral)   Resp 19   SpO2 98%  Physical Exam Vitals and nursing note reviewed.   82 year old male, resting comfortably and in no acute distress. Vital signs are significant for elevated blood pressure. Oxygen saturation is 98%, which is normal. Head is normocephalic and atraumatic. PERRLA, EOMI. Oropharynx is clear. Neck is nontender and supple without adenopathy or JVD.  There are no carotid bruits. Lungs are clear without rales, wheezes, or rhonchi. Chest is nontender. Heart has regular rate and rhythm without murmur. Abdomen is soft, flat, nontender. Extremities have no cyanosis or edema, full range of motion is present. Skin is warm and dry without rash. Neurologic: Mental status is normal, cranial nerves are intact, strength is 5/5 in all 4 extremities, moderate resting tremor is present.  ED Results / Procedures / Treatments   Labs (all labs ordered are listed, but only abnormal results are displayed) Labs Reviewed  BASIC METABOLIC PANEL - Abnormal; Notable for the following components:      Result Value   CO2 21 (*)    Glucose, Bld 114 (*)    BUN 31 (*)    Creatinine, Ser 1.38 (*)    GFR, Estimated 52 (*)    All other components within normal limits  CBC - Abnormal; Notable for the following components:   WBC 11.4 (*)    RBC 3.83 (*)    Hemoglobin 12.2 (*)    HCT 38.1 (*)     Platelets 97 (*)    All other components within normal limits  URINALYSIS, ROUTINE W REFLEX MICROSCOPIC - Abnormal; Notable for the following components:   Ketones, ur 5 (*)    Protein, ur 30 (*)    All other components within normal limits  CBG MONITORING, ED  TROPONIN I (HIGH SENSITIVITY)    EKG EKG Interpretation Date/Time:  Friday October 05 2022 17:55:57 EDT Ventricular Rate:  86 PR Interval:  178 QRS Duration:  112 QT Interval:  376 QTC Calculation: 450 R Axis:   -67  Text Interpretation: Sinus rhythm Incomplete RBBB and LAFB Abnormal R-wave progression, late transition Left ventricular hypertrophy No old tracing to compare Reconfirmed by Dione Booze ([id number]) on 10/05/2022 11:11:44 PM  Radiology No results found.  Procedures Procedures  {Document cardiac monitor, telemetry assessment procedure when appropriate:1}  Medications Ordered in ED Medications - No data to display  ED Course/ Medical Decision Making/ A&P   {   Click here for ABCD2, HEART and other calculatorsREFRESH Note before signing :1}                              Medical Decision Making Amount and/or Complexity of Data Reviewed Labs: ordered.   Episode of near syncope most likely orthostatic possibly related to recent antihypertensive medication changes.  Consider occult cardiac injury.  Episode does not appear to be vertigo.  I have reviewed his electrocardiogram, and my interpretation is left anterior fascicular block with incomplete right bundle branch block but no ST or T changes and no prior ECGs available for comparison.  I have reviewed his laboratory tests, and my interpretation is mild elevation of random glucose which will need to be followed as an outpatient, mild elevation of creatinine and BUN which are slightly worse than baseline but most recent value was on 01/26/2022 and change is not significant, stable anemia, stable mild leukocytosis which on prior blood draws has been a lymphocytosis,  thrombocytopenia which is new and will need to be investigated as an outpatient.  Urinalysis has mild proteinuria and mild ketonuria and slightly high specific gravity indicating he might be mildly dehydrated.  I have ordered orthostatic vital signs to be checked and I have ordered a troponin level.  At this point, he has been in the ED for over 5 hours and a single troponin would be sufficient to rule out ACS.  {Document critical care time when appropriate:1} {Document review of labs and clinical decision tools ie heart score, Chads2Vasc2 etc:1}  {Document your independent review of radiology images, and any outside records:1} {Document your discussion with family members, caretakers, and with consultants:1} {Document social determinants of health affecting pt's care:1} {Document your decision making why or why not admission, treatments were needed:1} Final Clinical Impression(s) /  ED Diagnoses Final diagnoses:  None    Rx / DC Orders ED Discharge Orders     None

## 2022-10-05 NOTE — ED Triage Notes (Signed)
Patient felt faint while at [facility]. He felt dizzy after standing up fro sitting in the car for a while. The patient takes BP medication and believes this may have been the cause.

## 2022-10-06 ENCOUNTER — Encounter (HOSPITAL_COMMUNITY): Payer: Self-pay | Admitting: Family Medicine

## 2022-10-06 DIAGNOSIS — N1831 Chronic kidney disease, stage 3a: Secondary | ICD-10-CM | POA: Diagnosis not present

## 2022-10-06 DIAGNOSIS — I951 Orthostatic hypotension: Secondary | ICD-10-CM | POA: Diagnosis not present

## 2022-10-06 DIAGNOSIS — I10 Essential (primary) hypertension: Secondary | ICD-10-CM | POA: Diagnosis present

## 2022-10-06 DIAGNOSIS — D696 Thrombocytopenia, unspecified: Secondary | ICD-10-CM | POA: Diagnosis not present

## 2022-10-06 DIAGNOSIS — D72820 Lymphocytosis (symptomatic): Secondary | ICD-10-CM | POA: Insufficient documentation

## 2022-10-06 LAB — TECHNOLOGIST SMEAR REVIEW: Plt Morphology: ADEQUATE

## 2022-10-06 LAB — HEPATIC FUNCTION PANEL
ALT: 10 U/L (ref 0–44)
AST: 11 U/L — ABNORMAL LOW (ref 15–41)
Albumin: 3.9 g/dL (ref 3.5–5.0)
Alkaline Phosphatase: 41 U/L (ref 38–126)
Bilirubin, Direct: 0.3 mg/dL — ABNORMAL HIGH (ref 0.0–0.2)
Indirect Bilirubin: 1.7 mg/dL — ABNORMAL HIGH (ref 0.3–0.9)
Total Bilirubin: 2 mg/dL — ABNORMAL HIGH (ref 0.3–1.2)
Total Protein: 5.8 g/dL — ABNORMAL LOW (ref 6.5–8.1)

## 2022-10-06 LAB — FOLATE: Folate: 21.9 ng/mL (ref >5.9)

## 2022-10-06 LAB — BASIC METABOLIC PANEL
Anion gap: 9 (ref 5–15)
BUN: 31 mg/dL — ABNORMAL HIGH (ref 8–23)
CO2: 25 mmol/L (ref 22–32)
Calcium: 8.7 mg/dL — ABNORMAL LOW (ref 8.9–10.3)
Chloride: 104 mmol/L (ref 98–111)
Creatinine, Ser: 1.22 mg/dL (ref 0.61–1.24)
GFR, Estimated: 60 mL/min — ABNORMAL LOW (ref >60)
Glucose, Bld: 103 mg/dL — ABNORMAL HIGH (ref 70–99)
Potassium: 4 mmol/L (ref 3.5–5.1)
Sodium: 138 mmol/L (ref 135–145)

## 2022-10-06 LAB — DIFFERENTIAL
Abs Immature Granulocytes: 0.03 10*3/uL (ref 0.00–0.07)
Basophils Absolute: 0 10*3/uL (ref 0.0–0.1)
Basophils Relative: 0 %
Eosinophils Absolute: 0.1 10*3/uL (ref 0.0–0.5)
Eosinophils Relative: 1 %
Immature Granulocytes: 0 %
Lymphocytes Relative: 51 %
Lymphs Abs: 5.5 10*3/uL — ABNORMAL HIGH (ref 0.7–4.0)
Monocytes Absolute: 0.6 10*3/uL (ref 0.1–1.0)
Monocytes Relative: 5 %
Neutro Abs: 4.8 10*3/uL (ref 1.7–7.7)
Neutrophils Relative %: 43 %

## 2022-10-06 LAB — CBC
HCT: 33 % — ABNORMAL LOW (ref 39.0–52.0)
Hemoglobin: 10.8 g/dL — ABNORMAL LOW (ref 13.0–17.0)
MCH: 31.6 pg (ref 26.0–34.0)
MCHC: 32.7 g/dL (ref 30.0–36.0)
MCV: 96.5 fL (ref 80.0–100.0)
Platelets: 207 10*3/uL (ref 150–400)
RBC: 3.42 MIL/uL — ABNORMAL LOW (ref 4.22–5.81)
RDW: 11.9 % (ref 11.5–15.5)
WBC: 11.1 10*3/uL — ABNORMAL HIGH (ref 4.0–10.5)
nRBC: 0.2 % (ref 0.0–0.2)

## 2022-10-06 LAB — HIV ANTIBODY (ROUTINE TESTING W REFLEX): HIV Screen 4th Generation wRfx: NONREACTIVE

## 2022-10-06 LAB — VITAMIN B12: Vitamin B-12: 525 pg/mL (ref 180–914)

## 2022-10-06 LAB — TROPONIN I (HIGH SENSITIVITY): Troponin I (High Sensitivity): 5 ng/L (ref <18)

## 2022-10-06 MED ORDER — ORAL CARE MOUTH RINSE: 15.0000 mL | OROMUCOSAL | Status: DC | PRN

## 2022-10-06 MED ORDER — ONDANSETRON HCL 4 MG/2ML IJ SOLN: 4.0000 mg | Freq: Four times a day (QID) | INTRAMUSCULAR | Status: DC | PRN

## 2022-10-06 MED ORDER — AMLODIPINE BESYLATE 10 MG PO TABS
5.0000 mg | ORAL_TABLET | Freq: Every day | ORAL | Status: DC
  Administered 2022-10-06: 5 mg via ORAL
  Filled 2022-10-06 (×2): qty 1

## 2022-10-06 MED ORDER — ONDANSETRON HCL 4 MG PO TABS: 4.0000 mg | ORAL_TABLET | Freq: Four times a day (QID) | ORAL | Status: DC | PRN

## 2022-10-06 MED ORDER — ACETAMINOPHEN 325 MG PO TABS
650.0000 mg | ORAL_TABLET | Freq: Four times a day (QID) | ORAL | Status: DC | PRN
  Administered 2022-10-06: 650 mg via ORAL
  Filled 2022-10-06: qty 2

## 2022-10-06 MED ORDER — PANTOPRAZOLE SODIUM 40 MG PO TBEC
40.0000 mg | DELAYED_RELEASE_TABLET | Freq: Every day | ORAL | Status: DC
  Administered 2022-10-06 – 2022-10-09 (×4): 40 mg via ORAL
  Filled 2022-10-06 (×4): qty 1

## 2022-10-06 MED ORDER — SODIUM CHLORIDE 0.9% FLUSH
3.0000 mL | Freq: Two times a day (BID) | INTRAVENOUS | Status: DC
  Administered 2022-10-06 – 2022-10-09 (×8): 3 mL via INTRAVENOUS

## 2022-10-06 MED ORDER — ACETAMINOPHEN 650 MG RE SUPP: 650.0000 mg | Freq: Four times a day (QID) | RECTAL | Status: DC | PRN

## 2022-10-06 MED ORDER — SODIUM CHLORIDE 0.9 % IV BOLUS
1000.0000 mL | Freq: Once | INTRAVENOUS | Status: AC
  Administered 2022-10-06: 1000 mL via INTRAVENOUS

## 2022-10-06 MED ORDER — SENNOSIDES-DOCUSATE SODIUM 8.6-50 MG PO TABS: 1.0000 | ORAL_TABLET | Freq: Every evening | ORAL | Status: DC | PRN

## 2022-10-06 MED ORDER — FLUDROCORTISONE ACETATE 0.1 MG PO TABS
0.1000 mg | ORAL_TABLET | Freq: Every day | ORAL | Status: DC
  Administered 2022-10-06 – 2022-10-09 (×4): 0.1 mg via ORAL
  Filled 2022-10-06 (×4): qty 1

## 2022-10-06 NOTE — ED Notes (Signed)
Patient is currently eat. Will work on orthostatic vitals will he finishes.

## 2022-10-06 NOTE — H&P (Signed)
History and Physical    Konnar Belli ZOX:[id number] DOB: 17-May-1940 DOA: 10/05/2022  PCP: Merlene Laughter, MD (Inactive)   Patient coming from: ALF   Chief Complaint: Lightheaded, near-syncope   HPI: Bobby [name] is a 82 y.o. 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  Patient reports a long history of orthostatic hypotension for which she had been taking Florinef.  His BP was noted to be elevated over the summer and so his PCP had him tapering off of the Florinef.  He had been experiencing some lightheadedness upon standing the past several days but it was mild and he had not thought much of it.  Yesterday, symptoms became severe and he felt very close to losing consciousness.  There was no chest pain, palpitations, or other symptoms and he felt back to baseline quickly.  He has never attempted compression stockings or abdominal binder for his orthostasis.  He denies any recent nausea, vomiting, diarrhea, or loss of appetite.  ED Course: Upon arrival to the ED, patient is found to be afebrile and saturating well on room air with normal heart rate and stable blood pressure.  SBP dropped 98 mmHg upon standing.  Labs were most notable for creatinine 1.38, WBC 11,400, and platelets 97,000.  Patient was given a liter of normal saline in the ED, reported feeling better, but still had 82 mmHg drop in SBP upon standing after the fluid bolus.  Review of Systems:  All other systems reviewed and apart from HPI, are negative.  Past Medical History:  Diagnosis Date   Anxiety    Diverticulosis    Orthostatic hypotension    Tinnitus     Past Surgical History:  Procedure Laterality Date   BALLOON DILATION N/A 02/26/2012   Procedure: BALLOON DILATION;  Surgeon: Charolett Bumpers, MD;  Location: [facility];  Service: Endoscopy;  Laterality:  N/A;   CHOLECYSTECTOMY N/A 05/03/2015   Procedure: LAPAROSCOPIC CHOLECYSTECTOMY WITH INTRAOPERATIVE CHOLANGIOGRAM , LAPAROSCOPIC GASTROTOMY  ;  Surgeon: Ovidio Kin, MD;  Location: [facility];  Service: General;  Laterality: N/A;   ERCP N/A 05/03/2015   Procedure: ENDOSCOPIC RETROGRADE CHOLANGIOPANCREATOGRAPHY (ERCP);  Surgeon: Jeani Hawking, MD;  Location: [facility];  Service: Gastroenterology;  Laterality: N/A;   ESOPHAGOGASTRODUODENOSCOPY N/A 02/26/2012   Procedure: ESOPHAGOGASTRODUODENOSCOPY (EGD);  Surgeon: Charolett Bumpers, MD;  Location: [facility];  Service: Endoscopy;  Laterality: N/A;   ESOPHAGOGASTRODUODENOSCOPY (EGD) WITH PROPOFOL N/A 05/02/2015   Procedure: ESOPHAGOGASTRODUODENOSCOPY (EGD) WITH PROPOFOL;  Surgeon: Willis Modena, MD;  Location: [facility];  Service: Endoscopy;  Laterality: N/A;   TONSILLECTOMY      Social History:   reports that he quit smoking about 45 years ago. He has never used smokeless tobacco. He reports current alcohol use of about 3.0 standard drinks of alcohol per week. He reports that he does not use drugs.  No Known Allergies  Family History  Problem Relation Age of Onset   Cancer Mother    Breast cancer Mother    Stroke Mother    Diabetes Neg Hx    Hypertension Neg Hx      Prior to Admission medications   Medication Sig Start Date End Date Taking? Authorizing Provider  acetaminophen (TYLENOL) 500 MG tablet Take 1,000-1,500 mg by mouth 2 (two) times daily.   Yes [provider]  amLODipine (NORVASC) 5 MG tablet Take 5 mg by mouth daily. 09/25/22  Yes  [provider]  cholecalciferol (VITAMIN D) 1000 units tablet Take 1,000 Units by mouth daily.   Yes [provider]  fludrocortisone (FLORINEF) 0.1 MG tablet Take 0.1 mg by mouth every morning. 03/30/15  Yes [provider]  Multiple Vitamin (MULTIVITAMIN WITH MINERALS) TABS tablet Take 1 tablet by mouth daily.   Yes [provider]  omeprazole (PRILOSEC) 20 MG  capsule Take 20 mg by mouth daily with breakfast. 04/25/15  Yes [provider]  potassium chloride SA (KLOR-CON) 20 MEQ tablet po twice daily for 2 days and then once daily thereafter. F/u with PCP in 2 week for rpt labs and further potassium adjustment 01/29/20  Yes Johney Maine, MD    Physical Exam: Vitals:   10/06/22 0100 10/06/22 0130 10/06/22 0242 10/06/22 0315  BP:  (!) 156/82  (!) 167/78  Pulse: 74 66  66  Resp: 18 16  16   Temp:   98 F (36.7 C)   TempSrc:   Oral   SpO2: 97% 95%  98%     Constitutional: NAD, calm  Eyes: PERTLA, lids and conjunctivae normal ENMT: Mucous membranes are moist. Posterior pharynx clear of any exudate or lesions.   Neck: supple, no masses  Respiratory: no wheezing, no crackles. No accessory muscle use.  Cardiovascular: S1 & S2 heard, regular rate and rhythm. No extremity edema.   Abdomen: No distension, no tenderness, soft. Bowel sounds active.  Musculoskeletal: no clubbing / cyanosis. No joint deformity upper and lower extremities.   Skin: no significant rashes, lesions, ulcers. Warm, dry, well-perfused. Neurologic: CN 2-12 grossly intact. Moving all extremities. Alert and oriented. Resting tremor.  Psychiatric: Pleasant. Cooperative.    Labs and Imaging on Admission: I have personally reviewed following labs and imaging studies  CBC: Recent Labs  Lab 10/05/22 1813  WBC 11.4*  HGB 12.2*  HCT 38.1*  MCV 99.5  PLT 97*   Basic Metabolic Panel: Recent Labs  Lab 10/05/22 1813  NA 137  K 5.0  CL 105  CO2 21*  GLUCOSE 114*  BUN 31*  CREATININE 1.38*  CALCIUM 9.3   GFR: CrCl cannot be calculated (Unknown ideal weight.). Liver Function Tests: No results for input(s): "AST", "ALT", "ALKPHOS", "BILITOT", "PROT", "ALBUMIN" in the last 168 hours. No results for input(s): "LIPASE", "AMYLASE" in the last 168 hours. No results for input(s): "AMMONIA" in the last 168 hours. Coagulation Profile: No results for  input(s): "INR", "PROTIME" in the last 168 hours. Cardiac Enzymes: No results for input(s): "CKTOTAL", "CKMB", "CKMBINDEX", "TROPONINI" in the last 168 hours. BNP (last 3 results) No results for input(s): "PROBNP" in the last 8760 hours. HbA1C: No results for input(s): "HGBA1C" in the last 72 hours. CBG: Recent Labs  Lab 10/05/22 2135  GLUCAP 94   Lipid Profile: No results for input(s): "CHOL", "HDL", "LDLCALC", "TRIG", "CHOLHDL", "LDLDIRECT" in the last 72 hours. Thyroid Function Tests: No results for input(s): "TSH", "T4TOTAL", "FREET4", "T3FREE", "THYROIDAB" in the last 72 hours. Anemia Panel: No results for input(s): "VITAMINB12", "FOLATE", "FERRITIN", "TIBC", "IRON", "RETICCTPCT" in the last 72 hours. Urine analysis:    Component Value Date/Time   COLORURINE YELLOW 10/05/2022 2139   APPEARANCEUR CLEAR 10/05/2022 2139   LABSPEC 1.028 10/05/2022 2139   PHURINE 5.0 10/05/2022 2139   GLUCOSEU NEGATIVE 10/05/2022 2139   HGBUR NEGATIVE 10/05/2022 2139   BILIRUBINUR NEGATIVE 10/05/2022 2139   KETONESUR 5 (A) 10/05/2022 2139   PROTEINUR 30 (A) 10/05/2022 2139   NITRITE NEGATIVE 10/05/2022 2139  LEUKOCYTESUR NEGATIVE 10/05/2022 2139   Sepsis Labs: @LABRCNTIP (procalcitonin:4,lacticidven:4) )No results found for this or any previous visit (from the past 240 hour(s)).   Radiological Exams on Admission: No results found.  EKG: Independently reviewed. Sinus rhythm, incomplete RBBB and LAFB, LVH.   Assessment/Plan   1. Orthostatic hypotension  - SBP dropped 98 mmHg on standing initially, and dropped 82 mmHg after 1 L fluid bolus  - Increase fluid and salt intake, increase florinef back to 0.1 mg daily, start abdominal binder, repeat orthostatic vitals; if he remains symptomatic, can check plasma norepinephrine concentration and consider NET inhibitor   2. Thrombocytopenia  - Platelets 97,000 on admission, previously normal  - No apparent infection  - Request smear review,  check LFTs, HIV, HCV, B12, folate, and copper    3. Hypertension  - Continue Norvasc    4. CKD 3A  - Renally-dose medications    DVT prophylaxis: SCDs  Code Status: Full  Level of Care: Level of care: Telemetry Family Communication: Wife updated by RN   Disposition Plan:  Patient is from: ALF  Anticipated d/c is to: ALF  Anticipated d/c date is: 10/6 or 10/08/22  Patient currently: Pending improved orthostasis  Consults called: None  Admission status: Observation     Briscoe Deutscher, MD [facility]  10/06/2022, 3:31 AM

## 2022-10-06 NOTE — ED Notes (Signed)
Repeat orthostatics performed and MD made aware. Pt denies any symptoms at present or while standing.

## 2022-10-06 NOTE — Plan of Care (Signed)

## 2022-10-06 NOTE — Progress Notes (Signed)
Pt admitted earlier this am.    Bobby [name] is a 82 y.o. 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   On exam Pt reports being dizzy.  Will continue to monitor.  Restart florinef.  Check orthostatic vital signs.    Kathlen Mody MD

## 2022-10-07 DIAGNOSIS — Z79899 Other long term (current) drug therapy: Secondary | ICD-10-CM | POA: Diagnosis not present

## 2022-10-07 DIAGNOSIS — N1831 Chronic kidney disease, stage 3a: Secondary | ICD-10-CM | POA: Diagnosis present

## 2022-10-07 DIAGNOSIS — F419 Anxiety disorder, unspecified: Secondary | ICD-10-CM | POA: Diagnosis present

## 2022-10-07 DIAGNOSIS — D696 Thrombocytopenia, unspecified: Secondary | ICD-10-CM | POA: Diagnosis present

## 2022-10-07 DIAGNOSIS — D649 Anemia, unspecified: Secondary | ICD-10-CM | POA: Diagnosis present

## 2022-10-07 DIAGNOSIS — Z66 Do not resuscitate: Secondary | ICD-10-CM | POA: Diagnosis present

## 2022-10-07 DIAGNOSIS — I129 Hypertensive chronic kidney disease with stage 1 through stage 4 chronic kidney disease, or unspecified chronic kidney disease: Secondary | ICD-10-CM | POA: Diagnosis present

## 2022-10-07 DIAGNOSIS — I951 Orthostatic hypotension: Secondary | ICD-10-CM | POA: Diagnosis present

## 2022-10-07 DIAGNOSIS — D72829 Elevated white blood cell count, unspecified: Secondary | ICD-10-CM | POA: Diagnosis not present

## 2022-10-07 DIAGNOSIS — R55 Syncope and collapse: Secondary | ICD-10-CM | POA: Diagnosis not present

## 2022-10-07 DIAGNOSIS — R42 Dizziness and giddiness: Secondary | ICD-10-CM | POA: Diagnosis not present

## 2022-10-07 DIAGNOSIS — I10 Essential (primary) hypertension: Secondary | ICD-10-CM | POA: Diagnosis not present

## 2022-10-07 DIAGNOSIS — Z87891 Personal history of nicotine dependence: Secondary | ICD-10-CM | POA: Diagnosis not present

## 2022-10-07 LAB — BASIC METABOLIC PANEL
Anion gap: 8 (ref 5–15)
BUN: 28 mg/dL — ABNORMAL HIGH (ref 8–23)
CO2: 25 mmol/L (ref 22–32)
Calcium: 8.8 mg/dL — ABNORMAL LOW (ref 8.9–10.3)
Chloride: 105 mmol/L (ref 98–111)
Creatinine, Ser: 1.09 mg/dL (ref 0.61–1.24)
GFR, Estimated: >60 mL/min (ref >60)
Glucose, Bld: 116 mg/dL — ABNORMAL HIGH (ref 70–99)
Potassium: 3.6 mmol/L (ref 3.5–5.1)
Sodium: 138 mmol/L (ref 135–145)

## 2022-10-07 LAB — CBC
HCT: 33.3 % — ABNORMAL LOW (ref 39.0–52.0)
Hemoglobin: 11.1 g/dL — ABNORMAL LOW (ref 13.0–17.0)
MCH: 32 pg (ref 26.0–34.0)
MCHC: 33.3 g/dL (ref 30.0–36.0)
MCV: 96 fL (ref 80.0–100.0)
Platelets: 214 10*3/uL (ref 150–400)
RBC: 3.47 MIL/uL — ABNORMAL LOW (ref 4.22–5.81)
RDW: 11.9 % (ref 11.5–15.5)
WBC: 18.7 10*3/uL — ABNORMAL HIGH (ref 4.0–10.5)
nRBC: 0 % (ref 0.0–0.2)

## 2022-10-07 MED ORDER — LACTATED RINGERS IV BOLUS
500.0000 mL | Freq: Once | INTRAVENOUS | Status: AC
  Administered 2022-10-07: 500 mL via INTRAVENOUS

## 2022-10-07 NOTE — Progress Notes (Signed)
   10/07/22 1727  Orthostatic Sitting  BP- Sitting 146/76  Pulse- Sitting 74  Orthostatic Standing at 0 minutes  BP- Standing at 0 minutes (!) 89/76 (back to bed BP now 172/82, pulse 71)  Pulse- Standing at 0 minutes 83   Bolus complete. Orthostatics rechecked. Patient expressed feeling slightly dizzy. Patient started to lean forward as if he was going to pass out. Back to bed with blood pressure rechecked. Call bell and necessities within reach.

## 2022-10-07 NOTE — Progress Notes (Signed)
[facility]                                                                               Bobby [name], is a 82 y.o. male, DOB - 07/31/1940, ZOX:[id number] Admit date - 10/05/2022    Outpatient Primary MD for the patient is Stoneking, Ann Maki, MD (Inactive)  LOS - 0  days    Brief summary    Bobby [name] is a 82 y.o. 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    Assessment & Plan    Assessment and Plan:  Near syncope:  Suspect from orthostatic hypotension.  Severe lightheadedness and dizziness on standing up. Positive orthostatics earlier this am.  Continue with florinef.  Bolus with RL once today.  Hold the norvasc.  He has postural hypertension , so we will hold off on the midodrine. Add abdominal binder and compression stockings.   Stage 3 a CKD Creatinine at baseline.       Estimated body mass index is 22.66 kg/m as calculated from the following:   Height as of 01/29/20: 6' (1.829 m).   Weight as of this encounter: 75.8 kg.  Code Status: DNR- Limited.  DVT Prophylaxis:  SCDs Start: 10/06/22 0330   Level of Care: Level of care: Telemetry Family Communication: none at bedside.   Disposition Plan:     Remains inpatient appropriate:  pending improvement in the orthostatic vital signs.   Procedures:  Echocardiogram.   Consultants:   None.   Antimicrobials:   Anti-infectives (From admission, onward)    None        Medications  Scheduled Meds:  fludrocortisone  0.1 mg Oral Daily   pantoprazole  40 mg Oral Daily   sodium chloride flush  3 mL Intravenous Q12H   Continuous Infusions: PRN Meds:.acetaminophen **OR** acetaminophen, ondansetron **OR** ondansetron (ZOFRAN) IV, mouth rinse, senna-docusate    Subjective:   Bobby [name] was seen and examined today.  Profoundly  orthostatic earlier this am.   Objective:   Vitals:   10/07/22 0204 10/07/22 0500 10/07/22 0515 10/07/22 0900  BP: (!) 143/71  (!) 145/85   Pulse: 72  80   Resp: 18  16   Temp: 98.4 F (36.9 C)  99.4 F (37.4 C) 98.7 F (37.1 C)  TempSrc: Oral  Oral   SpO2: 95%  96%   Weight:  75.8 kg      Intake/Output Summary (Last 24 hours) at 10/07/2022 1422 Last data filed at 10/07/2022 0958 Gross per 24 hour  Intake 360 ml  Output 1300 ml  Net -940 ml   Filed Weights   10/07/22 0500  Weight: 75.8 kg     Exam General: Alert and oriented x 3, NAD Cardiovascular: S1 S2 auscultated, no murmurs, RRR Respiratory: Clear to auscultation bilaterally, no wheezing, rales or rhonchi Gastrointestinal: Soft, nontender, nondistended, + bowel sounds Ext: no pedal edema bilaterally Neuro: AAOx3, Cr N's II- XII. Strength 5/5 upper and lower extremities bilaterally Skin: No rashes Psych: Normal affect and demeanor, alert and oriented x3    Data Reviewed:  I have personally reviewed following labs and imaging studies   CBC Lab Results  Component Value Date   WBC 18.7 (H) 10/07/2022   RBC 3.47 (L) 10/07/2022   HGB 11.1 (L) 10/07/2022   HCT 33.3 (L) 10/07/2022   MCV 96.0 10/07/2022   MCH 32.0 10/07/2022   PLT 214 10/07/2022   MCHC 33.3 10/07/2022   RDW 11.9 10/07/2022   LYMPHSABS 5.5 (H) 10/06/2022   MONOABS 0.6 10/06/2022   EOSABS 0.1 10/06/2022   BASOSABS 0.0 10/06/2022     Last metabolic panel Lab Results  Component Value Date   NA 138 10/07/2022   K 3.6 10/07/2022   CL 105 10/07/2022   CO2 25 10/07/2022   BUN 28 (H) 10/07/2022   CREATININE 1.09 10/07/2022   GLUCOSE 116 (H) 10/07/2022   GFRNONAA >60 10/07/2022   GFRAA >60 01/30/2019   CALCIUM 8.8 (L) 10/07/2022   PHOS 3.4 04/30/2015   PROT 5.8 (L) 10/06/2022   ALBUMIN 3.9 10/06/2022   LABGLOB 2.4 03/12/2017   BILITOT 2.0 (H) 10/06/2022   ALKPHOS 41 10/06/2022   AST 11 (L) 10/06/2022   ALT 10 10/06/2022   ANIONGAP  8 10/07/2022    CBG (last 3)  Recent Labs    10/05/22 2135  GLUCAP 94      Coagulation Profile: No results for input(s): "INR", "PROTIME" in the last 168 hours.   Radiology Studies: No results found.     Kathlen Mody M.D. [facility] 10/07/2022, 2:22 PM  Available via Epic secure chat 7am-7pm After 7 pm, please refer to night coverage provider listed on amion.

## 2022-10-07 NOTE — Plan of Care (Signed)
  Problem: Clinical Measurements: Goal: Ability to maintain clinical measurements within normal limits will improve Outcome: Progressing Goal: Respiratory complications will improve Outcome: Progressing   Problem: Nutrition: Goal: Adequate nutrition will be maintained Outcome: Progressing

## 2022-10-08 ENCOUNTER — Inpatient Hospital Stay (HOSPITAL_COMMUNITY): Payer: Medicare Other

## 2022-10-08 DIAGNOSIS — N1831 Chronic kidney disease, stage 3a: Secondary | ICD-10-CM

## 2022-10-08 DIAGNOSIS — I10 Essential (primary) hypertension: Secondary | ICD-10-CM | POA: Diagnosis not present

## 2022-10-08 DIAGNOSIS — I951 Orthostatic hypotension: Secondary | ICD-10-CM | POA: Diagnosis not present

## 2022-10-08 DIAGNOSIS — R55 Syncope and collapse: Secondary | ICD-10-CM

## 2022-10-08 LAB — ECHOCARDIOGRAM COMPLETE
AR max vel: 2.42 cm2
AV Area VTI: 2.27 cm2
AV Area mean vel: 2.39 cm2
AV Mean grad: 6 mm[Hg]
AV Peak grad: 11.2 mm[Hg]
Ao pk vel: 1.67 m/s
Area-P 1/2: 3.39 cm2
S' Lateral: 3 cm
Weight: 2747.81 [oz_av]

## 2022-10-08 MED ORDER — ADULT MULTIVITAMIN W/MINERALS CH
1.0000 | ORAL_TABLET | Freq: Every day | ORAL | Status: DC
  Administered 2022-10-08 – 2022-10-09 (×2): 1 via ORAL
  Filled 2022-10-08 (×2): qty 1

## 2022-10-08 MED ORDER — VITAMIN D 25 MCG (1000 UNIT) PO TABS
1000.0000 [IU] | ORAL_TABLET | Freq: Every day | ORAL | Status: DC
  Administered 2022-10-08 – 2022-10-09 (×2): 1000 [IU] via ORAL
  Filled 2022-10-08 (×2): qty 1

## 2022-10-08 NOTE — Plan of Care (Signed)

## 2022-10-08 NOTE — Progress Notes (Signed)
[facility]                                                                               Bobby [name], is a 82 y.o. male, DOB - 11/14/1940, ZOX:[id number] Admit date - 10/05/2022    Outpatient Primary MD for the patient is Stoneking, Ann Maki, MD (Inactive)  LOS - 1  days    Brief summary    [name] is a 82 y.o. 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    Assessment & Plan    Assessment and Plan:  Near syncope:  Suspect from orthostatic hypotension.  Severe lightheadedness and dizziness on standing up. Positive orthostatics earlier this am.  Continue with florinef.  Bolus with RL once today.  Hold the norvasc.  He has postural hypertension , so we will hold off on the midodrine. Add abdominal binder and compression stockings. Echocardiogram ordered, and pending.     Stage 3 a CKD Creatinine at baseline.     Leukocytosis:  Unclear etiology.  UA is negative.  Get CXR.     Estimated body mass index is 23.29 kg/m as calculated from the following:   Height as of 01/29/20: 6' (1.829 m).   Weight as of this encounter: 77.9 kg.  Code Status: DNR- Limited.  DVT Prophylaxis:  SCDs Start: 10/06/22 0330   Level of Care: Level of care: Telemetry Family Communication: none at bedside.   Disposition Plan:     Remains inpatient appropriate:  pending improvement in the orthostatic vital signs.   Procedures:  Echocardiogram.   Consultants:   None.   Antimicrobials:   Anti-infectives (From admission, onward)    None        Medications  Scheduled Meds:  cholecalciferol  1,000 Units Oral Daily   fludrocortisone  0.1 mg Oral Daily   multivitamin with minerals  1 tablet Oral Daily   pantoprazole  40 mg Oral Daily   sodium chloride flush  3 mL Intravenous Q12H   Continuous  Infusions: PRN Meds:.acetaminophen **OR** acetaminophen, ondansetron **OR** ondansetron (ZOFRAN) IV, mouth rinse, senna-docusate    Subjective:   Bobby [name] was seen and examined today.   No new complaints.   Objective:   Vitals:   10/08/22 0327 10/08/22 0923 10/08/22 0928 10/08/22 1403  BP: (!) 171/86  (!) 175/89 (!) 176/92  Pulse: 67  73 73  Resp: 18 20  17   Temp: 98.7 F (37.1 C)   98 F (36.7 C)  TempSrc: Oral   Oral  SpO2: 94%  98% 95%  Weight: 77.9 kg       Intake/Output Summary (Last 24 hours) at 10/08/2022 1715 Last data filed at 10/08/2022 1623 Gross per 24 hour  Intake 867.23 ml  Output 1400 ml  Net -532.77 ml   Filed Weights   10/07/22 0500 10/08/22 0327  Weight: 75.8 kg 77.9 kg     Exam General exam: Appears calm and comfortable  Respiratory system: Clear to auscultation. Respiratory effort normal. Cardiovascular system: S1 & S2 heard, RRR. No JVD, Gastrointestinal system: Abdomen is nondistended,  soft and nontender.  Central nervous system: Alert and oriented. No focal neurological deficits. Extremities: Symmetric 5 x 5 power. Skin: No rashes,  Psychiatry: Mood & affect appropriate.    Data Reviewed:  I have personally reviewed following labs and imaging studies   CBC Lab Results  Component Value Date   WBC 18.7 (H) 10/07/2022   RBC 3.47 (L) 10/07/2022   HGB 11.1 (L) 10/07/2022   HCT 33.3 (L) 10/07/2022   MCV 96.0 10/07/2022   MCH 32.0 10/07/2022   PLT 214 10/07/2022   MCHC 33.3 10/07/2022   RDW 11.9 10/07/2022   LYMPHSABS 5.5 (H) 10/06/2022   MONOABS 0.6 10/06/2022   EOSABS 0.1 10/06/2022   BASOSABS 0.0 10/06/2022     Last metabolic panel Lab Results  Component Value Date   NA 138 10/07/2022   K 3.6 10/07/2022   CL 105 10/07/2022   CO2 25 10/07/2022   BUN 28 (H) 10/07/2022   CREATININE 1.09 10/07/2022   GLUCOSE 116 (H) 10/07/2022   GFRNONAA >60 10/07/2022   GFRAA >60 01/30/2019   CALCIUM 8.8 (L) 10/07/2022   PHOS 3.4  04/30/2015   PROT 5.8 (L) 10/06/2022   ALBUMIN 3.9 10/06/2022   LABGLOB 2.4 03/12/2017   BILITOT 2.0 (H) 10/06/2022   ALKPHOS 41 10/06/2022   AST 11 (L) 10/06/2022   ALT 10 10/06/2022   ANIONGAP 8 10/07/2022    CBG (last 3)  Recent Labs    10/05/22 2135  GLUCAP 94      Coagulation Profile: No results for input(s): "INR", "PROTIME" in the last 168 hours.   Radiology Studies: ECHOCARDIOGRAM COMPLETE  Result Date: 10/08/2022    ECHOCARDIOGRAM REPORT   Patient Name:   Bobby [name] Date of Exam: 10/08/2022 Medical Rec #:  [id number]     Height:       72.0 in Accession #:    [id number]    Weight:       171.7 lb Date of Birth:  [birth year]    BSA:          1.997 m Patient Age:    81 years      BP:           176/92 mmHg Patient Gender: M             HR:           77 bpm. Exam Location:  Inpatient Procedure: 2D Echo, Cardiac Doppler and Color Doppler Indications:    Syncope R55  History:        Patient has no prior history of Echocardiogram examinations.                 Signs/Symptoms:Syncope; Risk Factors:Former Smoker.  Sonographer:    Dondra Prader RVT RCS Referring Phys: [id number] Abdulwahab Demelo IMPRESSIONS  1. Left ventricular ejection fraction, by estimation, is 60 to 65%. The left ventricle has normal function. The left ventricle has no regional wall motion abnormalities. There is mild concentric left ventricular hypertrophy. Left ventricular diastolic parameters are consistent with Grade I diastolic dysfunction (impaired relaxation).  2. Right ventricular systolic function is normal. The right ventricular size is normal. There is normal pulmonary artery systolic pressure.  3. Left atrial size was mildly dilated.  4. The mitral valve is normal in structure. Trivial mitral valve regurgitation. No evidence of mitral stenosis.  5. The aortic valve is tricuspid. Aortic valve regurgitation is not visualized. No aortic stenosis is present.  6. The inferior vena cava is normal in  [facility]                                                                               Bobby [name], is a 82 y.o. male, DOB - 11/14/1940, ZOX:[id number] Admit date - 10/05/2022    Outpatient Primary MD for the patient is Stoneking, Ann Maki, MD (Inactive)  LOS - 1  days    Brief summary    [name] is a 82 y.o. 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    Assessment & Plan    Assessment and Plan:  Near syncope:  Suspect from orthostatic hypotension.  Severe lightheadedness and dizziness on standing up. Positive orthostatics earlier this am.  Continue with florinef.  Bolus with RL once today.  Hold the norvasc.  He has postural hypertension , so we will hold off on the midodrine. Add abdominal binder and compression stockings. Echocardiogram ordered, and pending.     Stage 3 a CKD Creatinine at baseline.     Leukocytosis:  Unclear etiology.  UA is negative.  Get CXR.     Estimated body mass index is 23.29 kg/m as calculated from the following:   Height as of 01/29/20: 6' (1.829 m).   Weight as of this encounter: 77.9 kg.  Code Status: DNR- Limited.  DVT Prophylaxis:  SCDs Start: 10/06/22 0330   Level of Care: Level of care: Telemetry Family Communication: none at bedside.   Disposition Plan:     Remains inpatient appropriate:  pending improvement in the orthostatic vital signs.   Procedures:  Echocardiogram.   Consultants:   None.   Antimicrobials:   Anti-infectives (From admission, onward)    None        Medications  Scheduled Meds:  cholecalciferol  1,000 Units Oral Daily   fludrocortisone  0.1 mg Oral Daily   multivitamin with minerals  1 tablet Oral Daily   pantoprazole  40 mg Oral Daily   sodium chloride flush  3 mL Intravenous Q12H   Continuous  Infusions: PRN Meds:.acetaminophen **OR** acetaminophen, ondansetron **OR** ondansetron (ZOFRAN) IV, mouth rinse, senna-docusate    Subjective:   Bobby [name] was seen and examined today.   No new complaints.   Objective:   Vitals:   10/08/22 0327 10/08/22 0923 10/08/22 0928 10/08/22 1403  BP: (!) 171/86  (!) 175/89 (!) 176/92  Pulse: 67  73 73  Resp: 18 20  17   Temp: 98.7 F (37.1 C)   98 F (36.7 C)  TempSrc: Oral   Oral  SpO2: 94%  98% 95%  Weight: 77.9 kg       Intake/Output Summary (Last 24 hours) at 10/08/2022 1715 Last data filed at 10/08/2022 1623 Gross per 24 hour  Intake 867.23 ml  Output 1400 ml  Net -532.77 ml   Filed Weights   10/07/22 0500 10/08/22 0327  Weight: 75.8 kg 77.9 kg     Exam General exam: Appears calm and comfortable  Respiratory system: Clear to auscultation. Respiratory effort normal. Cardiovascular system: S1 & S2 heard, RRR. No JVD, Gastrointestinal system: Abdomen is nondistended,  soft and nontender.  Central nervous system: Alert and oriented. No focal neurological deficits. Extremities: Symmetric 5 x 5 power. Skin: No rashes,  Psychiatry: Mood & affect appropriate.    Data Reviewed:  I have personally reviewed following labs and imaging studies   CBC Lab Results  Component Value Date   WBC 18.7 (H) 10/07/2022   RBC 3.47 (L) 10/07/2022   HGB 11.1 (L) 10/07/2022   HCT 33.3 (L) 10/07/2022   MCV 96.0 10/07/2022   MCH 32.0 10/07/2022   PLT 214 10/07/2022   MCHC 33.3 10/07/2022   RDW 11.9 10/07/2022   LYMPHSABS 5.5 (H) 10/06/2022   MONOABS 0.6 10/06/2022   EOSABS 0.1 10/06/2022   BASOSABS 0.0 10/06/2022     Last metabolic panel Lab Results  Component Value Date   NA 138 10/07/2022   K 3.6 10/07/2022   CL 105 10/07/2022   CO2 25 10/07/2022   BUN 28 (H) 10/07/2022   CREATININE 1.09 10/07/2022   GLUCOSE 116 (H) 10/07/2022   GFRNONAA >60 10/07/2022   GFRAA >60 01/30/2019   CALCIUM 8.8 (L) 10/07/2022   PHOS 3.4  04/30/2015   PROT 5.8 (L) 10/06/2022   ALBUMIN 3.9 10/06/2022   LABGLOB 2.4 03/12/2017   BILITOT 2.0 (H) 10/06/2022   ALKPHOS 41 10/06/2022   AST 11 (L) 10/06/2022   ALT 10 10/06/2022   ANIONGAP 8 10/07/2022    CBG (last 3)  Recent Labs    10/05/22 2135  GLUCAP 94      Coagulation Profile: No results for input(s): "INR", "PROTIME" in the last 168 hours.   Radiology Studies: ECHOCARDIOGRAM COMPLETE  Result Date: 10/08/2022    ECHOCARDIOGRAM REPORT   Patient Name:   Bobby [name] Date of Exam: 10/08/2022 Medical Rec #:  [id number]     Height:       72.0 in Accession #:    [id number]    Weight:       171.7 lb Date of Birth:  [birth year]    BSA:          1.997 m Patient Age:    81 years      BP:           176/92 mmHg Patient Gender: M             HR:           77 bpm. Exam Location:  Inpatient Procedure: 2D Echo, Cardiac Doppler and Color Doppler Indications:    Syncope R55  History:        Patient has no prior history of Echocardiogram examinations.                 Signs/Symptoms:Syncope; Risk Factors:Former Smoker.  Sonographer:    Dondra Prader RVT RCS Referring Phys: [id number] Abdulwahab Demelo IMPRESSIONS  1. Left ventricular ejection fraction, by estimation, is 60 to 65%. The left ventricle has normal function. The left ventricle has no regional wall motion abnormalities. There is mild concentric left ventricular hypertrophy. Left ventricular diastolic parameters are consistent with Grade I diastolic dysfunction (impaired relaxation).  2. Right ventricular systolic function is normal. The right ventricular size is normal. There is normal pulmonary artery systolic pressure.  3. Left atrial size was mildly dilated.  4. The mitral valve is normal in structure. Trivial mitral valve regurgitation. No evidence of mitral stenosis.  5. The aortic valve is tricuspid. Aortic valve regurgitation is not visualized. No aortic stenosis is present.  6. The inferior vena cava is normal in

## 2022-10-08 NOTE — Progress Notes (Signed)
MD made aware of patient's elevated blood pressures. (See Flowsheet). No new orders at this time.

## 2022-10-08 NOTE — Progress Notes (Signed)
*  PRELIMINARY RESULTS* Echocardiogram 2D Echocardiogram has been performed.  Bobby [name] 10/08/2022, 3:39 PM

## 2022-10-08 NOTE — Plan of Care (Signed)
  Problem: Safety: Goal: Ability to remain free from injury will improve Outcome: Progressing   Problem: Pain Managment: Goal: General experience of comfort will improve Outcome: Progressing   Problem: Coping: Goal: Level of anxiety will decrease Outcome: Progressing   Problem: Clinical Measurements: Goal: Ability to maintain clinical measurements within normal limits will improve Outcome: Progressing   Problem: Activity: Goal: Risk for activity intolerance will decrease Outcome: Progressing

## 2022-10-08 NOTE — Progress Notes (Signed)
   10/08/22 0923  Vitals  ECG Heart Rate 85  Resp 20  MEWS COLOR  MEWS Score Color Green  Orthostatic Lying   BP- Lying (!) 173/91  Pulse- Lying 83  Orthostatic Sitting  BP- Sitting 127/76  Pulse- Sitting 82  Orthostatic Standing at 0 minutes  BP- Standing at 0 minutes (!) 63/43  Pulse- Standing at 0 minutes 82  Orthostatic Standing at 3 minutes  BP- Standing at 3 minutes  (not able to complete at this time (patient lightheaded))  Pulse- Standing at 3 minutes  (not able to complete at this time (patient lightheaded))  MEWS Score  MEWS Temp 0  MEWS Systolic 0  MEWS Pulse 0  MEWS RR 0  MEWS LOC 0  MEWS Score 0

## 2022-10-09 DIAGNOSIS — I951 Orthostatic hypotension: Secondary | ICD-10-CM | POA: Diagnosis not present

## 2022-10-09 DIAGNOSIS — N1831 Chronic kidney disease, stage 3a: Secondary | ICD-10-CM | POA: Diagnosis not present

## 2022-10-09 DIAGNOSIS — I10 Essential (primary) hypertension: Secondary | ICD-10-CM | POA: Diagnosis not present

## 2022-10-09 LAB — CBC WITH DIFFERENTIAL/PLATELET
Abs Immature Granulocytes: 0.05 K/uL (ref 0.00–0.07)
Basophils Absolute: 0 K/uL (ref 0.0–0.1)
Basophils Relative: 0 %
Eosinophils Absolute: 0.2 K/uL (ref 0.0–0.5)
Eosinophils Relative: 2 %
HCT: 35 % — ABNORMAL LOW (ref 39.0–52.0)
Hemoglobin: 11.9 g/dL — ABNORMAL LOW (ref 13.0–17.0)
Immature Granulocytes: 0 %
Lymphocytes Relative: 45 %
Lymphs Abs: 6.3 K/uL — ABNORMAL HIGH (ref 0.7–4.0)
MCH: 31.7 pg (ref 26.0–34.0)
MCHC: 34 g/dL (ref 30.0–36.0)
MCV: 93.3 fL (ref 80.0–100.0)
Monocytes Absolute: 0.7 K/uL (ref 0.1–1.0)
Monocytes Relative: 5 %
Neutro Abs: 6.6 K/uL (ref 1.7–7.7)
Neutrophils Relative %: 48 %
Platelets: 235 K/uL (ref 150–400)
RBC: 3.75 MIL/uL — ABNORMAL LOW (ref 4.22–5.81)
RDW: 11.7 % (ref 11.5–15.5)
WBC: 13.9 K/uL — ABNORMAL HIGH (ref 4.0–10.5)
nRBC: 0 % (ref 0.0–0.2)

## 2022-10-09 LAB — HCV INTERPRETATION

## 2022-10-09 LAB — HCV AB W REFLEX TO QUANT PCR: HCV Ab: NONREACTIVE

## 2022-10-09 LAB — COPPER, SERUM: Copper: 63 ug/dL — ABNORMAL LOW (ref 69–132)

## 2022-10-09 MED ORDER — FLUDROCORTISONE ACETATE 0.1 MG PO TABS: 0.1000 mg | ORAL_TABLET | Freq: Two times a day (BID) | ORAL | 0 refills | Status: AC

## 2022-10-09 NOTE — Progress Notes (Signed)
Measured patient for thigh high compression stockings. Measurement: ankle 23 cm, calf 34 cm, thigh 50 cm, gluteal crease to heel 92 cm.

## 2022-10-09 NOTE — Plan of Care (Signed)
  Problem: Clinical Measurements: Goal: Ability to maintain clinical measurements within normal limits will improve Outcome: Progressing   Problem: Activity: Goal: Risk for activity intolerance will decrease Outcome: Progressing   

## 2022-10-09 NOTE — Plan of Care (Addendum)

## 2022-10-09 NOTE — Progress Notes (Signed)
Patient received discharge orders to go home. Patient was given discharge instructions/paperwork. Another RN assisted primary RN with going over the discharge paperwork/instructions with the patient. All questions/concerns were addressed/answered during that time. Patient left the hospital stable, had discharge paperwork/instructions, had abdominal binder, had thigh compression stockings, and had all personal belongings.

## 2022-10-15 NOTE — Discharge Summary (Signed)
Physician Discharge Summary   Patient: Bobby [name] MRN: [id number] DOB: April 18, 1940  Admit date:     10/05/2022  Discharge date: 10/09/2022  Discharge Physician: Kathlen Mody   PCP: Merlene Laughter, MD (Inactive)   Recommendations at discharge:  Please follow up with PCP as scheduled.    Discharge Diagnoses: Principal Problem:   Orthostatic hypotension Active Problems:   CKD stage 3a, GFR 45-59 ml/min (HCC)   Thrombocytopenia (HCC)   Essential hypertension    Hospital Course: [name] is a 82 y.o. 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   Assessment and Plan:  Near syncope:  Suspect from orthostatic hypotension.  Severe lightheadedness and dizziness on standing up. Positive orthostatics but asymptomatic, increased florinef to BID dosing with improvement in BP parameters and symptoms.  He has postural hypertension , so we will hold off on the midodrine. Add abdominal binder and compression stockings. Echocardiogram ordered, and unremarkable.  Pt very adamant about going home and promises to follow up with PCP . He reports this is ongoing for many years and ha sbeen off florinef for the last few weeks.        Stage 3 a CKD Creatinine at baseline.        Leukocytosis:  Unclear etiology.  UA is negative.  Negative CXR.        Estimated body mass index is 23.29 kg/m as calculated from the following:   Height as of 01/29/20: 6' (1.829 m).   Weight as of this encounter: 77.9 kg.     Consultants: none  Procedures performed: echo  Disposition: Home Diet recommendation:  Discharge Diet Orders (From admission, onward)     Start     Ordered   10/09/22 0000  Diet - low sodium heart healthy        10/09/22 1657           Regular diet DISCHARGE MEDICATION: Allergies as of 10/09/2022   No Known  Allergies      Medication List     STOP taking these medications    amLODipine 5 MG tablet Commonly known as: NORVASC       TAKE these medications    acetaminophen 500 MG tablet Commonly known as: TYLENOL Take 1,000-1,500 mg by mouth 2 (two) times daily.   cholecalciferol 1000 units tablet Commonly known as: VITAMIN D Take 1,000 Units by mouth daily.   fludrocortisone 0.1 MG tablet Commonly known as: FLORINEF Take 1 tablet (0.1 mg total) by mouth 2 (two) times daily. What changed: when to take this   multivitamin with minerals Tabs tablet Take 1 tablet by mouth daily.   omeprazole 20 MG capsule Commonly known as: PRILOSEC Take 20 mg by mouth daily with breakfast.   potassium chloride SA 20 MEQ tablet Commonly known as: KLOR-CON M po twice daily for 2 days and then once daily thereafter. F/u with PCP in 2 week for rpt labs and further potassium adjustment        Discharge Exam: Filed Weights   10/07/22 0500 10/08/22 0327 10/09/22 4782  Weight: 75.8 kg 77.9 kg 77.1 kg   General exam: Appears calm and comfortable  Respiratory system: Clear to auscultation. Respiratory effort normal. Cardiovascular system: S1 & S2 heard, RRR. No JVD, murmurs, rubs, gallops or clicks. No pedal edema. Gastrointestinal system: Abdomen is nondistended, soft and nontender. No organomegaly or masses felt.  Normal bowel sounds heard. Central nervous system: Alert and oriented. No focal neurological deficits. Extremities: Symmetric 5 x 5 power. Skin: No rashes, lesions or ulcers Psychiatry: Judgement and insight appear normal. Mood & affect appropriate.    Condition at discharge: fair  The results of significant diagnostics from this hospitalization (including imaging, microbiology, ancillary and laboratory) are listed below for reference.   Imaging Studies: DG CHEST PORT 1 VIEW  Result Date: 10/08/2022 CLINICAL DATA:  Leukocytosis. Hypertension and lightheadedness.  Near syncope. EXAM: PORTABLE CHEST 1 VIEW COMPARISON:  None Available. FINDINGS: No focal consolidation, pleural effusion, or pneumothorax. The cardiac silhouette is within normal limits. No acute osseous pathology. IMPRESSION: No active disease. Electronically Signed   By: Elgie Collard M.D.   On: 10/08/2022 22:08   ECHOCARDIOGRAM COMPLETE  Result Date: 10/08/2022    ECHOCARDIOGRAM REPORT   Patient Name:   Bobby [name] Date of Exam: 10/08/2022 Medical Rec #:  [id number]     Height:       72.0 in Accession #:    [id number]    Weight:       171.7 lb Date of Birth:  [birth year]    BSA:          1.997 m Patient Age:    81 years      BP:           176/92 mmHg Patient Gender: M             HR:           77 bpm. Exam Location:  Inpatient Procedure: 2D Echo, Cardiac Doppler and Color Doppler Indications:    Syncope R55  History:        Patient has no prior history of Echocardiogram examinations.                 Signs/Symptoms:Syncope; Risk Factors:Former Smoker.  Sonographer:    Dondra Prader RVT RCS Referring Phys: [id number] IMPRESSIONS  1. Left ventricular ejection fraction, by estimation, is 60 to 65%. The left ventricle has normal function. The left ventricle has no regional wall motion abnormalities. There is mild concentric left ventricular hypertrophy. Left ventricular diastolic parameters are consistent with Grade I diastolic dysfunction (impaired relaxation).  2. Right ventricular systolic function is normal. The right ventricular size is normal. There is normal pulmonary artery systolic pressure.  3. Left atrial size was mildly dilated.  4. The mitral valve is normal in structure. Trivial mitral valve regurgitation. No evidence of mitral stenosis.  5. The aortic valve is tricuspid. Aortic valve regurgitation is not visualized. No aortic stenosis is present.  6. The inferior vena cava is normal in size with greater than 50% respiratory variability, suggesting right atrial pressure of 3 mmHg. FINDINGS   Left Ventricle: Left ventricular ejection fraction, by estimation, is 60 to 65%. The left ventricle has normal function. The left ventricle has no regional wall motion abnormalities. The left ventricular internal cavity size was normal in size. There is  mild concentric left ventricular hypertrophy. Left ventricular diastolic parameters are consistent with Grade I diastolic dysfunction (impaired relaxation). Indeterminate filling pressures. Right Ventricle: The right ventricular size is normal. No increase in right ventricular wall thickness. Right ventricular systolic function is normal. There is normal pulmonary artery systolic pressure. The tricuspid regurgitant velocity is 2.51 m/s, and  with an assumed right atrial pressure of 3 mmHg, the estimated right ventricular systolic pressure is 28.2 mmHg. Left Atrium: Left atrial size was mildly dilated. Right Atrium:  Physician Discharge Summary   Patient: Bobby [name] MRN: [id number] DOB: April 18, 1940  Admit date:     10/05/2022  Discharge date: 10/09/2022  Discharge Physician: Kathlen Mody   PCP: Merlene Laughter, MD (Inactive)   Recommendations at discharge:  Please follow up with PCP as scheduled.    Discharge Diagnoses: Principal Problem:   Orthostatic hypotension Active Problems:   CKD stage 3a, GFR 45-59 ml/min (HCC)   Thrombocytopenia (HCC)   Essential hypertension    Hospital Course: [name] is a 82 y.o. 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   Assessment and Plan:  Near syncope:  Suspect from orthostatic hypotension.  Severe lightheadedness and dizziness on standing up. Positive orthostatics but asymptomatic, increased florinef to BID dosing with improvement in BP parameters and symptoms.  He has postural hypertension , so we will hold off on the midodrine. Add abdominal binder and compression stockings. Echocardiogram ordered, and unremarkable.  Pt very adamant about going home and promises to follow up with PCP . He reports this is ongoing for many years and ha sbeen off florinef for the last few weeks.        Stage 3 a CKD Creatinine at baseline.        Leukocytosis:  Unclear etiology.  UA is negative.  Negative CXR.        Estimated body mass index is 23.29 kg/m as calculated from the following:   Height as of 01/29/20: 6' (1.829 m).   Weight as of this encounter: 77.9 kg.     Consultants: none  Procedures performed: echo  Disposition: Home Diet recommendation:  Discharge Diet Orders (From admission, onward)     Start     Ordered   10/09/22 0000  Diet - low sodium heart healthy        10/09/22 1657           Regular diet DISCHARGE MEDICATION: Allergies as of 10/09/2022   No Known  Allergies      Medication List     STOP taking these medications    amLODipine 5 MG tablet Commonly known as: NORVASC       TAKE these medications    acetaminophen 500 MG tablet Commonly known as: TYLENOL Take 1,000-1,500 mg by mouth 2 (two) times daily.   cholecalciferol 1000 units tablet Commonly known as: VITAMIN D Take 1,000 Units by mouth daily.   fludrocortisone 0.1 MG tablet Commonly known as: FLORINEF Take 1 tablet (0.1 mg total) by mouth 2 (two) times daily. What changed: when to take this   multivitamin with minerals Tabs tablet Take 1 tablet by mouth daily.   omeprazole 20 MG capsule Commonly known as: PRILOSEC Take 20 mg by mouth daily with breakfast.   potassium chloride SA 20 MEQ tablet Commonly known as: KLOR-CON M po twice daily for 2 days and then once daily thereafter. F/u with PCP in 2 week for rpt labs and further potassium adjustment        Discharge Exam: Filed Weights   10/07/22 0500 10/08/22 0327 10/09/22 4782  Weight: 75.8 kg 77.9 kg 77.1 kg   General exam: Appears calm and comfortable  Respiratory system: Clear to auscultation. Respiratory effort normal. Cardiovascular system: S1 & S2 heard, RRR. No JVD, murmurs, rubs, gallops or clicks. No pedal edema. Gastrointestinal system: Abdomen is nondistended, soft and nontender. No organomegaly or masses felt.  Physician Discharge Summary   Patient: Bobby [name] MRN: [id number] DOB: April 18, 1940  Admit date:     10/05/2022  Discharge date: 10/09/2022  Discharge Physician: Kathlen Mody   PCP: Merlene Laughter, MD (Inactive)   Recommendations at discharge:  Please follow up with PCP as scheduled.    Discharge Diagnoses: Principal Problem:   Orthostatic hypotension Active Problems:   CKD stage 3a, GFR 45-59 ml/min (HCC)   Thrombocytopenia (HCC)   Essential hypertension    Hospital Course: [name] is a 82 y.o. 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   Assessment and Plan:  Near syncope:  Suspect from orthostatic hypotension.  Severe lightheadedness and dizziness on standing up. Positive orthostatics but asymptomatic, increased florinef to BID dosing with improvement in BP parameters and symptoms.  He has postural hypertension , so we will hold off on the midodrine. Add abdominal binder and compression stockings. Echocardiogram ordered, and unremarkable.  Pt very adamant about going home and promises to follow up with PCP . He reports this is ongoing for many years and ha sbeen off florinef for the last few weeks.        Stage 3 a CKD Creatinine at baseline.        Leukocytosis:  Unclear etiology.  UA is negative.  Negative CXR.        Estimated body mass index is 23.29 kg/m as calculated from the following:   Height as of 01/29/20: 6' (1.829 m).   Weight as of this encounter: 77.9 kg.     Consultants: none  Procedures performed: echo  Disposition: Home Diet recommendation:  Discharge Diet Orders (From admission, onward)     Start     Ordered   10/09/22 0000  Diet - low sodium heart healthy        10/09/22 1657           Regular diet DISCHARGE MEDICATION: Allergies as of 10/09/2022   No Known  Allergies      Medication List     STOP taking these medications    amLODipine 5 MG tablet Commonly known as: NORVASC       TAKE these medications    acetaminophen 500 MG tablet Commonly known as: TYLENOL Take 1,000-1,500 mg by mouth 2 (two) times daily.   cholecalciferol 1000 units tablet Commonly known as: VITAMIN D Take 1,000 Units by mouth daily.   fludrocortisone 0.1 MG tablet Commonly known as: FLORINEF Take 1 tablet (0.1 mg total) by mouth 2 (two) times daily. What changed: when to take this   multivitamin with minerals Tabs tablet Take 1 tablet by mouth daily.   omeprazole 20 MG capsule Commonly known as: PRILOSEC Take 20 mg by mouth daily with breakfast.   potassium chloride SA 20 MEQ tablet Commonly known as: KLOR-CON M po twice daily for 2 days and then once daily thereafter. F/u with PCP in 2 week for rpt labs and further potassium adjustment        Discharge Exam: Filed Weights   10/07/22 0500 10/08/22 0327 10/09/22 4782  Weight: 75.8 kg 77.9 kg 77.1 kg   General exam: Appears calm and comfortable  Respiratory system: Clear to auscultation. Respiratory effort normal. Cardiovascular system: S1 & S2 heard, RRR. No JVD, murmurs, rubs, gallops or clicks. No pedal edema. Gastrointestinal system: Abdomen is nondistended, soft and nontender. No organomegaly or masses felt.

## 2022-10-17 DIAGNOSIS — Z23 Encounter for immunization: Secondary | ICD-10-CM | POA: Diagnosis not present

## 2022-10-18 DIAGNOSIS — I10 Essential (primary) hypertension: Secondary | ICD-10-CM | POA: Diagnosis not present

## 2022-10-18 DIAGNOSIS — I951 Orthostatic hypotension: Secondary | ICD-10-CM | POA: Diagnosis not present

## 2022-10-31 DIAGNOSIS — Z23 Encounter for immunization: Secondary | ICD-10-CM | POA: Diagnosis not present

## 2022-11-01 DIAGNOSIS — I10 Essential (primary) hypertension: Secondary | ICD-10-CM | POA: Diagnosis not present

## 2022-11-01 DIAGNOSIS — I951 Orthostatic hypotension: Secondary | ICD-10-CM | POA: Diagnosis not present

## 2022-12-21 DIAGNOSIS — N1831 Chronic kidney disease, stage 3a: Secondary | ICD-10-CM | POA: Diagnosis not present

## 2022-12-21 DIAGNOSIS — D72820 Lymphocytosis (symptomatic): Secondary | ICD-10-CM | POA: Diagnosis not present

## 2022-12-21 DIAGNOSIS — R1319 Other dysphagia: Secondary | ICD-10-CM | POA: Diagnosis not present

## 2022-12-21 DIAGNOSIS — K219 Gastro-esophageal reflux disease without esophagitis: Secondary | ICD-10-CM | POA: Diagnosis not present

## 2022-12-21 DIAGNOSIS — N401 Enlarged prostate with lower urinary tract symptoms: Secondary | ICD-10-CM | POA: Diagnosis not present

## 2022-12-21 DIAGNOSIS — M545 Low back pain, unspecified: Secondary | ICD-10-CM | POA: Diagnosis not present

## 2022-12-21 DIAGNOSIS — I951 Orthostatic hypotension: Secondary | ICD-10-CM | POA: Diagnosis not present

## 2022-12-21 DIAGNOSIS — I700 Atherosclerosis of aorta: Secondary | ICD-10-CM | POA: Diagnosis not present

## 2022-12-21 DIAGNOSIS — K222 Esophageal obstruction: Secondary | ICD-10-CM | POA: Diagnosis not present

## 2022-12-21 DIAGNOSIS — G250 Essential tremor: Secondary | ICD-10-CM | POA: Diagnosis not present

## 2022-12-21 DIAGNOSIS — R5383 Other fatigue: Secondary | ICD-10-CM | POA: Diagnosis not present

## 2022-12-21 DIAGNOSIS — G8929 Other chronic pain: Secondary | ICD-10-CM | POA: Diagnosis not present

## 2023-01-04 DIAGNOSIS — M6281 Muscle weakness (generalized): Secondary | ICD-10-CM | POA: Diagnosis not present

## 2023-01-04 DIAGNOSIS — M5459 Other low back pain: Secondary | ICD-10-CM | POA: Diagnosis not present

## 2023-01-07 DIAGNOSIS — M5459 Other low back pain: Secondary | ICD-10-CM | POA: Diagnosis not present

## 2023-01-07 DIAGNOSIS — M6281 Muscle weakness (generalized): Secondary | ICD-10-CM | POA: Diagnosis not present

## 2023-01-09 DIAGNOSIS — M5459 Other low back pain: Secondary | ICD-10-CM | POA: Diagnosis not present

## 2023-01-09 DIAGNOSIS — M6281 Muscle weakness (generalized): Secondary | ICD-10-CM | POA: Diagnosis not present

## 2023-01-11 DIAGNOSIS — M6281 Muscle weakness (generalized): Secondary | ICD-10-CM | POA: Diagnosis not present

## 2023-01-11 DIAGNOSIS — M5459 Other low back pain: Secondary | ICD-10-CM | POA: Diagnosis not present

## 2023-01-14 DIAGNOSIS — M6281 Muscle weakness (generalized): Secondary | ICD-10-CM | POA: Diagnosis not present

## 2023-01-14 DIAGNOSIS — M5459 Other low back pain: Secondary | ICD-10-CM | POA: Diagnosis not present

## 2023-01-14 DIAGNOSIS — H401131 Primary open-angle glaucoma, bilateral, mild stage: Secondary | ICD-10-CM | POA: Diagnosis not present

## 2023-01-16 DIAGNOSIS — M6281 Muscle weakness (generalized): Secondary | ICD-10-CM | POA: Diagnosis not present

## 2023-01-16 DIAGNOSIS — M5459 Other low back pain: Secondary | ICD-10-CM | POA: Diagnosis not present

## 2023-01-18 DIAGNOSIS — M6281 Muscle weakness (generalized): Secondary | ICD-10-CM | POA: Diagnosis not present

## 2023-01-18 DIAGNOSIS — M5459 Other low back pain: Secondary | ICD-10-CM | POA: Diagnosis not present

## 2023-01-21 DIAGNOSIS — M6281 Muscle weakness (generalized): Secondary | ICD-10-CM | POA: Diagnosis not present

## 2023-01-21 DIAGNOSIS — M5459 Other low back pain: Secondary | ICD-10-CM | POA: Diagnosis not present

## 2023-01-23 DIAGNOSIS — M6281 Muscle weakness (generalized): Secondary | ICD-10-CM | POA: Diagnosis not present

## 2023-01-23 DIAGNOSIS — M5459 Other low back pain: Secondary | ICD-10-CM | POA: Diagnosis not present

## 2023-01-25 DIAGNOSIS — M6281 Muscle weakness (generalized): Secondary | ICD-10-CM | POA: Diagnosis not present

## 2023-01-25 DIAGNOSIS — M5459 Other low back pain: Secondary | ICD-10-CM | POA: Diagnosis not present

## 2023-01-28 DIAGNOSIS — M5459 Other low back pain: Secondary | ICD-10-CM | POA: Diagnosis not present

## 2023-01-28 DIAGNOSIS — M6281 Muscle weakness (generalized): Secondary | ICD-10-CM | POA: Diagnosis not present

## 2023-01-30 DIAGNOSIS — M6281 Muscle weakness (generalized): Secondary | ICD-10-CM | POA: Diagnosis not present

## 2023-01-30 DIAGNOSIS — M5459 Other low back pain: Secondary | ICD-10-CM | POA: Diagnosis not present

## 2023-01-30 NOTE — Progress Notes (Incomplete)
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 male who has been referred to [place] by Dr. Merlene Laughter, his PCP, for evaluation and management of lymphocytosis.  He presents to the clinic today accompanied by his wife.  He notes lately he has felt low energy and low stamina. This change is not sudden but still a significant drop in the past 6 months. About 1 year prior he started to feel more presence of fatigue. He denied and lumps or bumps and palpable lymph nodes. No enlarged lymph nodes in his last CT from 2017.   On review of symptoms, pt notes significant fatigue from doing heavy yard work and walking a mile and his back now gets tired sooner. He denies SOB. He notes stable weight. He denies fever, chills, night sweats, abdominal pain, skin rash or itching or leg swelling. He denies recent viral infection.   He has been on low dose Fludrocortisone for 2-3 years for his orthostatic hypotension. He attributes this hypotension to prior alcohol use and other issues. He cut back on tylenol which he takes for osteoarthritis. No new medication change in the past 6 months.He denies iron or other vitamin deficiency. He notes he had a tight esophagus and had it stretched 5 years ago. He had gallbladder removed in 2017. Pt notes having to chew his food very well before swallowing. He was a smoker 40 years ago. He denies chemical exposures in the past. He is up to date on his cancer screenings. He has never had a blood transfusion.    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  Patient was last seen by me on 01/26/2022 and  reported a tremor, which was not affecting his movement.  Today, he reports that his wife passed away from ovarian cancer in early November which has been a stressor. He has otherwise been doing well overall.   Patient is enrolled with independent living. Patient generally manages his own meals and continues to drive independently. He reports that he does has family close by.   He generally uses a walker to move around, though he did not use a walker today. Patient reports decreased stamina. He does work with a physical therapist to strengthen his core muscles.   He reports that he bumped his head to a door last night and denies any major injuries.   Patient reports that his blood pressure was thought to be elevated previously and he was started on a blood pressure-lowering medication for some time. Patient notes that he has been on Fludrocortisone for a long time and continues to take it. He is not on a blood pressure-lowering medication at this time. His BP in clinic today is 139/93.   He denies any unexplained fever, chills, night sweats, new lumps/bumps, significant changes in breathing, abdominal pain, change in bowel/urination habits, new leg swelling, new back pain, dizzy spells, or syncope.   He reports that his PCP is Eleanora Neighbor, MD.   MEDICAL HISTORY:  Past Medical History:  Diagnosis Date   Anxiety    Diverticulosis    Orthostatic hypotension    Tinnitus     SURGICAL HISTORY: Past Surgical History:  Procedure Laterality Date   BALLOON DILATION N/A 02/26/2012   Procedure: BALLOON DILATION;  Surgeon: Charolett Bumpers, MD;  Location: [facility];  Service: Endoscopy;  Laterality: N/A;   CHOLECYSTECTOMY N/A 05/03/2015   Procedure: LAPAROSCOPIC CHOLECYSTECTOMY WITH INTRAOPERATIVE CHOLANGIOGRAM , LAPAROSCOPIC GASTROTOMY  ;  Surgeon: Ovidio Kin, MD;  Location: [facility];  Service: General;  Laterality: N/A;   ERCP N/A 05/03/2015   Procedure: ENDOSCOPIC RETROGRADE  CHOLANGIOPANCREATOGRAPHY (ERCP);  Surgeon: Jeani Hawking, MD;  Location: [facility];  Service: Gastroenterology;  Laterality: N/A;   ESOPHAGOGASTRODUODENOSCOPY N/A 02/26/2012   Procedure: ESOPHAGOGASTRODUODENOSCOPY (EGD);  Surgeon: Charolett Bumpers, MD;  Location: [facility];  Service: Endoscopy;  Laterality: N/A;   ESOPHAGOGASTRODUODENOSCOPY (EGD) WITH PROPOFOL N/A 05/02/2015   Procedure: ESOPHAGOGASTRODUODENOSCOPY (EGD) WITH PROPOFOL;  Surgeon: Willis Modena, MD;  Location: [facility];  Service: Endoscopy;  Laterality: N/A;   TONSILLECTOMY      SOCIAL HISTORY: Social History   Socioeconomic History   Marital status: Married    Spouse name: Not on file   Number of children: Not on file   Years of education: Not on file   Highest education level: Not on file  Occupational History   Not on file  Tobacco Use   Smoking status: Former    Current packs/day: 0.00    Types: Cigarettes    Quit date: 61    Years since quitting: 46.1   Smokeless tobacco: Never  Vaping Use   Vaping status: Never Used  Substance and Sexual Activity   Alcohol use: Yes    Alcohol/week: 3.0 standard drinks of alcohol    Types: 3 Shots of liquor per week    Comment: occasional   Drug use: No   Sexual activity: Not on file  Other Topics Concern   Not on file  Social History Narrative   Not on file   Social Drivers of Health   Financial Resource Strain: Not on file  Food Insecurity: No Food Insecurity (10/06/2022)   Hunger Vital Sign    Worried About Running Out of Food in the Last Year: Never true    Ran Out of Food in the Last Year: Never true  Transportation Needs: No Transportation Needs (10/06/2022)   PRAPARE - Administrator, Civil Service (Medical): No    Lack of Transportation (Non-Medical): No  Physical Activity: Not on file  Stress: Not on file  Social Connections: Not on file  Intimate Partner Violence: Not At Risk (10/06/2022)   Humiliation, Afraid, Rape, and Kick questionnaire     Fear of Current or Ex-Partner: No    Emotionally Abused: No    Physically Abused: No    Sexually Abused: No    FAMILY HISTORY: Family History  Problem Relation Age of Onset   Cancer Mother    Breast cancer Mother    Stroke Mother    Diabetes Neg Hx    Hypertension Neg Hx     ALLERGIES:  has no known allergies.  MEDICATIONS:  Current Outpatient Medications  Medication Sig Dispense Refill   acetaminophen (TYLENOL) 500 MG tablet Take 1,000-1,500 mg by mouth 2 (two) times daily.     cholecalciferol (VITAMIN D) 1000 units tablet Take 1,000 Units by mouth daily.     fludrocortisone (FLORINEF) 0.1 MG tablet Take 1 tablet (0.1 mg total) by mouth 2 (two) times daily. 60 tablet 0   Multiple Vitamin (MULTIVITAMIN WITH MINERALS) TABS tablet Take 1 tablet by mouth daily.     omeprazole (PRILOSEC) 20  MG capsule Take 20 mg by mouth daily with breakfast.  2   potassium chloride SA (KLOR-CON) 20 MEQ tablet po twice daily for 2 days and then once daily thereafter. F/u with PCP in 2 week for rpt labs and further potassium adjustment 60 tablet 0   No current facility-administered medications for this visit.    REVIEW OF SYSTEMS:    10 Point review of Systems was done is negative except as noted above.   PHYSICAL EXAMINATION: ECOG FS:1 - Symptomatic but completely ambulatory  There were no vitals filed for this visit.  Wt Readings from Last 3 Encounters:  10/09/22 169 lb 15.6 oz (77.1 kg)  01/26/22 181 lb 12.8 oz (82.5 kg)  01/27/21 191 lb 9.6 oz (86.9 kg)   There is no height or weight on file to calculate BMI.     GENERAL:alert, in no acute distress and comfortable SKIN: no acute rashes, no significant lesions EYES: conjunctiva are pink and non-injected, sclera anicteric OROPHARYNX: MMM, no exudates, no oropharyngeal erythema or ulceration NECK: supple, no JVD LYMPH:  no palpable lymphadenopathy in the cervical, axillary or inguinal regions LUNGS: clear to  auscultation b/l with normal respiratory effort HEART: regular rate & rhythm ABDOMEN:  normoactive bowel sounds , non tender, not distended. Extremity: no pedal edema PSYCH: alert & oriented x 3 with fluent speech NEURO: no focal motor/sensory deficits    LABORATORY DATA:  I have reviewed the data as listed     Latest Ref Rng & Units 10/09/2022    8:43 AM 10/07/2022    3:57 AM 10/06/2022    5:04 AM  CBC  WBC 4.0 - 10.5 K/uL 13.9  18.7  11.1   Hemoglobin 13.0 - 17.0 g/dL 95.6  21.3  08.6   Hematocrit 39.0 - 52.0 % 35.0  33.3  33.0   Platelets 150 - 400 K/uL 235  214  207     . CBC    Component Value Date/Time   WBC 13.9 (H) 10/09/2022 0843   RBC 3.75 (L) 10/09/2022 0843   HGB 11.9 (L) 10/09/2022 0843   HGB 12.5 (L) 01/26/2022 1059   HCT 35.0 (L) 10/09/2022 0843   PLT 235 10/09/2022 0843   PLT 233 01/26/2022 1059   MCV 93.3 10/09/2022 0843   MCH 31.7 10/09/2022 0843   MCHC 34.0 10/09/2022 0843   RDW 11.7 10/09/2022 0843   LYMPHSABS 6.3 (H) 10/09/2022 0843   MONOABS 0.7 10/09/2022 0843   EOSABS 0.2 10/09/2022 0843   BASOSABS 0.0 10/09/2022 0843     .    Latest Ref Rng & Units 10/07/2022    3:57 AM 10/06/2022    5:04 AM 10/05/2022    6:13 PM  CMP  Glucose 70 - 99 mg/dL 578  469  629   BUN 8 - 23 mg/dL 28  31  31    Creatinine 0.61 - 1.24 mg/dL 5.28  4.13  2.44   Sodium 135 - 145 mmol/L 138  138  137   Potassium 3.5 - 5.1 mmol/L 3.6  4.0  5.0   Chloride 98 - 111 mmol/L 105  104  105   CO2 22 - 32 mmol/L 25  25  21    Calcium 8.9 - 10.3 mg/dL 8.8  8.7  9.3   Total Protein 6.5 - 8.1 g/dL  5.8    Total Bilirubin 0.3 - 1.2 mg/dL  2.0    Alkaline Phos 38 - 126 U/L  41    AST  15 - 41 U/L  11    ALT 0 - 44 U/L  10     . Lab Results  Component Value Date   LDH 114 01/26/2022      OUTSIDE LABS    RADIOGRAPHIC STUDIES: I have personally reviewed the radiological images as listed and agreed with the findings in the report. No results found.  ASSESSMENT & PLAN:    Bobby [name] is a 83 y.o. caucasian male with    1.Monoclonal B Lymphocytosis vs CLL Previous labs showed 80% of all lymphocytes were clonal. Patient's lymphocyte count has now increased to 7k and presuming that clonal lymphocytes to make of 80% of the lymphocyte population the total clonal lymphocytes could be more than 5k and there is a possibility that this is trending towards CLL.  03/25/17 PET/CT revealed No hypermetabolic evidence of a lymphoproliferative process. No lymphadenopathy. Normal size spleen. No osseous hypermetabolism. Small hiatal hernia. Prominently patulous thoracic esophagus with esophageal fluid level, suggesting prominent chronic esophageal dysmotility and/or gastroesophageal reflux disease. Nonspecific circumferential wall thickening in the mid to lower thoracic esophagus, without discrete esophageal mass or hypermetabolism, differential includes reflux esophagitis, Barrett's esophagus or neoplasm. Upper endoscopic correlation may be obtained as clinically warranted. Chronic findings include: Aortic Atherosclerosis  Coronary atherosclerosis. Marked colonic diverticulosis.  2) . Patient Active Problem List   Diagnosis Date Noted   CKD stage 3a, GFR 45-59 ml/min (HCC) 10/06/2022   Thrombocytopenia (HCC) 10/06/2022   Monoclonal B-cell lymphocytosis 10/06/2022   Essential hypertension 10/06/2022   Normocytic anemia    Gallstone pancreatitis 04/30/2015   Orthostatic hypotension 04/30/2015   Pancreatitis 04/30/2015   Acute gallstone pancreatitis    PLAN:  -Discussed lab results on 02/01/2023 in detail with patient. CBC stable, showed WBC of 9.9K, hemoglobin of 12.1, and platelets of 255K. -WBCs have progressively normalized -total WBCs are normal; lymphocytes stable/improved -hgb improved from 11.1 in early October 2024, to 12 currently -platelets normal -CMP stable -did not feel an enlarged spleen during physical examination.  -no clinical sign or lab evidence  of progression of monoclonal B lymphocytosis -patient is agreeable to having his labs monitored by his PCP, Eleanora Neighbor, MD, twice a year. If there is concern for abnormal blood counts, he shall return to clinic with [place] as needed.   FOLLOW UP: RTC with PCP  The total time spent in the appointment was *** minutes* .  All of the patient's questions were answered with apparent satisfaction. The patient knows to call the clinic with any problems, questions or concerns.   Wyvonnia Lora MD MS AAHIVMS [facility] Hematology/Oncology Physician [facility]  .*Total Encounter Time as defined by the Centers for Medicare and Medicaid Services includes, in addition to the face-to-face time of a patient visit (documented in the note above) non-face-to-face time: obtaining and reviewing outside history, ordering and reviewing medications, tests or procedures, care coordination (communications with other health care professionals or caregivers) and documentation in the medical record.    I,Mitra Faeizi,acting as a Neurosurgeon for Wyvonnia Lora, MD.,have documented all relevant documentation on the behalf of Wyvonnia Lora, MD,as directed by  Wyvonnia Lora, MD while in the presence of Wyvonnia Lora, MD.  ***

## 2023-01-31 ENCOUNTER — Other Ambulatory Visit: Payer: Self-pay

## 2023-01-31 DIAGNOSIS — D72820 Lymphocytosis (symptomatic): Secondary | ICD-10-CM

## 2023-02-01 ENCOUNTER — Inpatient Hospital Stay: Payer: Medicare Other | Attending: Hematology

## 2023-02-01 ENCOUNTER — Inpatient Hospital Stay (HOSPITAL_BASED_OUTPATIENT_CLINIC_OR_DEPARTMENT_OTHER): Payer: Medicare Other | Admitting: Hematology

## 2023-02-01 VITALS — BP 139/93 | HR 88 | Temp 98.2°F | Resp 16 | Wt 164.3 lb

## 2023-02-01 DIAGNOSIS — I700 Atherosclerosis of aorta: Secondary | ICD-10-CM | POA: Insufficient documentation

## 2023-02-01 DIAGNOSIS — Z9089 Acquired absence of other organs: Secondary | ICD-10-CM | POA: Diagnosis not present

## 2023-02-01 DIAGNOSIS — N1831 Chronic kidney disease, stage 3a: Secondary | ICD-10-CM | POA: Diagnosis not present

## 2023-02-01 DIAGNOSIS — Z809 Family history of malignant neoplasm, unspecified: Secondary | ICD-10-CM | POA: Diagnosis not present

## 2023-02-01 DIAGNOSIS — I2510 Atherosclerotic heart disease of native coronary artery without angina pectoris: Secondary | ICD-10-CM | POA: Insufficient documentation

## 2023-02-01 DIAGNOSIS — I959 Hypotension, unspecified: Secondary | ICD-10-CM | POA: Insufficient documentation

## 2023-02-01 DIAGNOSIS — D72820 Lymphocytosis (symptomatic): Secondary | ICD-10-CM | POA: Insufficient documentation

## 2023-02-01 DIAGNOSIS — Z87891 Personal history of nicotine dependence: Secondary | ICD-10-CM | POA: Insufficient documentation

## 2023-02-01 DIAGNOSIS — Z9049 Acquired absence of other specified parts of digestive tract: Secondary | ICD-10-CM | POA: Insufficient documentation

## 2023-02-01 DIAGNOSIS — Z79899 Other long term (current) drug therapy: Secondary | ICD-10-CM | POA: Diagnosis not present

## 2023-02-01 DIAGNOSIS — Z803 Family history of malignant neoplasm of breast: Secondary | ICD-10-CM | POA: Insufficient documentation

## 2023-02-01 DIAGNOSIS — W2209XA Striking against other stationary object, initial encounter: Secondary | ICD-10-CM | POA: Diagnosis not present

## 2023-02-01 DIAGNOSIS — R251 Tremor, unspecified: Secondary | ICD-10-CM | POA: Insufficient documentation

## 2023-02-01 DIAGNOSIS — I129 Hypertensive chronic kidney disease with stage 1 through stage 4 chronic kidney disease, or unspecified chronic kidney disease: Secondary | ICD-10-CM | POA: Insufficient documentation

## 2023-02-01 DIAGNOSIS — K449 Diaphragmatic hernia without obstruction or gangrene: Secondary | ICD-10-CM | POA: Diagnosis not present

## 2023-02-01 DIAGNOSIS — R5383 Other fatigue: Secondary | ICD-10-CM | POA: Diagnosis not present

## 2023-02-01 DIAGNOSIS — K5730 Diverticulosis of large intestine without perforation or abscess without bleeding: Secondary | ICD-10-CM | POA: Insufficient documentation

## 2023-02-01 DIAGNOSIS — Z823 Family history of stroke: Secondary | ICD-10-CM | POA: Insufficient documentation

## 2023-02-01 LAB — CBC WITH DIFFERENTIAL (CANCER CENTER ONLY)
Abs Immature Granulocytes: 0.02 10*3/uL (ref 0.00–0.07)
Basophils Absolute: 0 10*3/uL (ref 0.0–0.1)
Basophils Relative: 0 %
Eosinophils Absolute: 0.1 10*3/uL (ref 0.0–0.5)
Eosinophils Relative: 1 %
HCT: 36.9 % — ABNORMAL LOW (ref 39.0–52.0)
Hemoglobin: 12.1 g/dL — ABNORMAL LOW (ref 13.0–17.0)
Immature Granulocytes: 0 %
Lymphocytes Relative: 43 %
Lymphs Abs: 4.2 10*3/uL — ABNORMAL HIGH (ref 0.7–4.0)
MCH: 31.9 pg (ref 26.0–34.0)
MCHC: 32.8 g/dL (ref 30.0–36.0)
MCV: 97.4 fL (ref 80.0–100.0)
Monocytes Absolute: 0.9 10*3/uL (ref 0.1–1.0)
Monocytes Relative: 9 %
Neutro Abs: 4.6 10*3/uL (ref 1.7–7.7)
Neutrophils Relative %: 47 %
Platelet Count: 255 10*3/uL (ref 150–400)
RBC: 3.79 MIL/uL — ABNORMAL LOW (ref 4.22–5.81)
RDW: 11.9 % (ref 11.5–15.5)
WBC Count: 9.9 10*3/uL (ref 4.0–10.5)
nRBC: 0 % (ref 0.0–0.2)

## 2023-02-01 LAB — CMP (CANCER CENTER ONLY)
ALT: 6 U/L (ref 0–44)
AST: 9 U/L — ABNORMAL LOW (ref 15–41)
Albumin: 4.2 g/dL (ref 3.5–5.0)
Alkaline Phosphatase: 45 U/L (ref 38–126)
Anion gap: 6 (ref 5–15)
BUN: 34 mg/dL — ABNORMAL HIGH (ref 8–23)
CO2: 28 mmol/L (ref 22–32)
Calcium: 9.1 mg/dL (ref 8.9–10.3)
Chloride: 109 mmol/L (ref 98–111)
Creatinine: 1.14 mg/dL (ref 0.61–1.24)
GFR, Estimated: >60 mL/min (ref >60)
Glucose, Bld: 95 mg/dL (ref 70–99)
Potassium: 4.1 mmol/L (ref 3.5–5.1)
Sodium: 143 mmol/L (ref 135–145)
Total Bilirubin: 1.1 mg/dL (ref 0.0–1.2)
Total Protein: 6.2 g/dL — ABNORMAL LOW (ref 6.5–8.1)

## 2023-02-01 LAB — LACTATE DEHYDROGENASE: LDH: 110 U/L (ref 98–192)

## 2023-02-04 DIAGNOSIS — M6281 Muscle weakness (generalized): Secondary | ICD-10-CM | POA: Diagnosis not present

## 2023-02-04 DIAGNOSIS — M5459 Other low back pain: Secondary | ICD-10-CM | POA: Diagnosis not present

## 2023-02-05 DIAGNOSIS — M5459 Other low back pain: Secondary | ICD-10-CM | POA: Diagnosis not present

## 2023-02-05 DIAGNOSIS — M6281 Muscle weakness (generalized): Secondary | ICD-10-CM | POA: Diagnosis not present

## 2023-02-06 DIAGNOSIS — M6281 Muscle weakness (generalized): Secondary | ICD-10-CM | POA: Diagnosis not present

## 2023-02-06 DIAGNOSIS — M5459 Other low back pain: Secondary | ICD-10-CM | POA: Diagnosis not present

## 2023-02-08 DIAGNOSIS — M5459 Other low back pain: Secondary | ICD-10-CM | POA: Diagnosis not present

## 2023-02-08 DIAGNOSIS — M6281 Muscle weakness (generalized): Secondary | ICD-10-CM | POA: Diagnosis not present

## 2023-02-11 DIAGNOSIS — M6281 Muscle weakness (generalized): Secondary | ICD-10-CM | POA: Diagnosis not present

## 2023-02-11 DIAGNOSIS — M5459 Other low back pain: Secondary | ICD-10-CM | POA: Diagnosis not present

## 2023-02-13 DIAGNOSIS — M5459 Other low back pain: Secondary | ICD-10-CM | POA: Diagnosis not present

## 2023-02-13 DIAGNOSIS — M6281 Muscle weakness (generalized): Secondary | ICD-10-CM | POA: Diagnosis not present

## 2023-02-15 DIAGNOSIS — M5459 Other low back pain: Secondary | ICD-10-CM | POA: Diagnosis not present

## 2023-02-15 DIAGNOSIS — M6281 Muscle weakness (generalized): Secondary | ICD-10-CM | POA: Diagnosis not present

## 2023-02-18 DIAGNOSIS — M6281 Muscle weakness (generalized): Secondary | ICD-10-CM | POA: Diagnosis not present

## 2023-02-18 DIAGNOSIS — M5459 Other low back pain: Secondary | ICD-10-CM | POA: Diagnosis not present

## 2023-02-20 DIAGNOSIS — M5459 Other low back pain: Secondary | ICD-10-CM | POA: Diagnosis not present

## 2023-02-20 DIAGNOSIS — M6281 Muscle weakness (generalized): Secondary | ICD-10-CM | POA: Diagnosis not present

## 2023-02-22 DIAGNOSIS — M5459 Other low back pain: Secondary | ICD-10-CM | POA: Diagnosis not present

## 2023-02-22 DIAGNOSIS — M6281 Muscle weakness (generalized): Secondary | ICD-10-CM | POA: Diagnosis not present

## 2023-02-25 DIAGNOSIS — M5459 Other low back pain: Secondary | ICD-10-CM | POA: Diagnosis not present

## 2023-02-25 DIAGNOSIS — M6281 Muscle weakness (generalized): Secondary | ICD-10-CM | POA: Diagnosis not present

## 2023-02-27 DIAGNOSIS — M5459 Other low back pain: Secondary | ICD-10-CM | POA: Diagnosis not present

## 2023-02-27 DIAGNOSIS — M6281 Muscle weakness (generalized): Secondary | ICD-10-CM | POA: Diagnosis not present

## 2023-03-01 DIAGNOSIS — M6281 Muscle weakness (generalized): Secondary | ICD-10-CM | POA: Diagnosis not present

## 2023-03-01 DIAGNOSIS — M5459 Other low back pain: Secondary | ICD-10-CM | POA: Diagnosis not present

## 2023-03-04 DIAGNOSIS — M6281 Muscle weakness (generalized): Secondary | ICD-10-CM | POA: Diagnosis not present

## 2023-03-04 DIAGNOSIS — M5459 Other low back pain: Secondary | ICD-10-CM | POA: Diagnosis not present

## 2023-03-06 DIAGNOSIS — M6281 Muscle weakness (generalized): Secondary | ICD-10-CM | POA: Diagnosis not present

## 2023-03-06 DIAGNOSIS — M5459 Other low back pain: Secondary | ICD-10-CM | POA: Diagnosis not present

## 2023-03-08 DIAGNOSIS — M5459 Other low back pain: Secondary | ICD-10-CM | POA: Diagnosis not present

## 2023-03-08 DIAGNOSIS — M6281 Muscle weakness (generalized): Secondary | ICD-10-CM | POA: Diagnosis not present

## 2023-03-11 DIAGNOSIS — M6281 Muscle weakness (generalized): Secondary | ICD-10-CM | POA: Diagnosis not present

## 2023-03-11 DIAGNOSIS — M5459 Other low back pain: Secondary | ICD-10-CM | POA: Diagnosis not present

## 2023-03-13 DIAGNOSIS — M6281 Muscle weakness (generalized): Secondary | ICD-10-CM | POA: Diagnosis not present

## 2023-03-13 DIAGNOSIS — M5459 Other low back pain: Secondary | ICD-10-CM | POA: Diagnosis not present

## 2023-03-15 DIAGNOSIS — M6281 Muscle weakness (generalized): Secondary | ICD-10-CM | POA: Diagnosis not present

## 2023-03-15 DIAGNOSIS — M5459 Other low back pain: Secondary | ICD-10-CM | POA: Diagnosis not present

## 2023-03-18 DIAGNOSIS — M5459 Other low back pain: Secondary | ICD-10-CM | POA: Diagnosis not present

## 2023-03-18 DIAGNOSIS — M6281 Muscle weakness (generalized): Secondary | ICD-10-CM | POA: Diagnosis not present

## 2023-03-20 DIAGNOSIS — M5459 Other low back pain: Secondary | ICD-10-CM | POA: Diagnosis not present

## 2023-03-20 DIAGNOSIS — M6281 Muscle weakness (generalized): Secondary | ICD-10-CM | POA: Diagnosis not present

## 2023-03-22 DIAGNOSIS — M6281 Muscle weakness (generalized): Secondary | ICD-10-CM | POA: Diagnosis not present

## 2023-03-22 DIAGNOSIS — M5459 Other low back pain: Secondary | ICD-10-CM | POA: Diagnosis not present

## 2023-03-25 DIAGNOSIS — M6281 Muscle weakness (generalized): Secondary | ICD-10-CM | POA: Diagnosis not present

## 2023-03-25 DIAGNOSIS — M5459 Other low back pain: Secondary | ICD-10-CM | POA: Diagnosis not present

## 2023-03-27 DIAGNOSIS — M5459 Other low back pain: Secondary | ICD-10-CM | POA: Diagnosis not present

## 2023-03-27 DIAGNOSIS — M6281 Muscle weakness (generalized): Secondary | ICD-10-CM | POA: Diagnosis not present

## 2023-04-01 DIAGNOSIS — I700 Atherosclerosis of aorta: Secondary | ICD-10-CM | POA: Diagnosis not present

## 2023-04-01 DIAGNOSIS — N1831 Chronic kidney disease, stage 3a: Secondary | ICD-10-CM | POA: Diagnosis not present

## 2023-04-01 DIAGNOSIS — I10 Essential (primary) hypertension: Secondary | ICD-10-CM | POA: Diagnosis not present

## 2023-04-01 DIAGNOSIS — M6281 Muscle weakness (generalized): Secondary | ICD-10-CM | POA: Diagnosis not present

## 2023-04-01 DIAGNOSIS — N401 Enlarged prostate with lower urinary tract symptoms: Secondary | ICD-10-CM | POA: Diagnosis not present

## 2023-04-01 DIAGNOSIS — M5459 Other low back pain: Secondary | ICD-10-CM | POA: Diagnosis not present

## 2023-04-03 DIAGNOSIS — M5459 Other low back pain: Secondary | ICD-10-CM | POA: Diagnosis not present

## 2023-04-03 DIAGNOSIS — M6281 Muscle weakness (generalized): Secondary | ICD-10-CM | POA: Diagnosis not present

## 2023-05-01 DIAGNOSIS — N401 Enlarged prostate with lower urinary tract symptoms: Secondary | ICD-10-CM | POA: Diagnosis not present

## 2023-05-01 DIAGNOSIS — I10 Essential (primary) hypertension: Secondary | ICD-10-CM | POA: Diagnosis not present

## 2023-05-01 DIAGNOSIS — N1831 Chronic kidney disease, stage 3a: Secondary | ICD-10-CM | POA: Diagnosis not present

## 2023-05-13 DIAGNOSIS — D225 Melanocytic nevi of trunk: Secondary | ICD-10-CM | POA: Diagnosis not present

## 2023-05-13 DIAGNOSIS — D1801 Hemangioma of skin and subcutaneous tissue: Secondary | ICD-10-CM | POA: Diagnosis not present

## 2023-05-13 DIAGNOSIS — L84 Corns and callosities: Secondary | ICD-10-CM | POA: Diagnosis not present

## 2023-05-13 DIAGNOSIS — D2262 Melanocytic nevi of left upper limb, including shoulder: Secondary | ICD-10-CM | POA: Diagnosis not present

## 2023-05-13 DIAGNOSIS — D2261 Melanocytic nevi of right upper limb, including shoulder: Secondary | ICD-10-CM | POA: Diagnosis not present

## 2023-05-13 DIAGNOSIS — L821 Other seborrheic keratosis: Secondary | ICD-10-CM | POA: Diagnosis not present

## 2023-05-13 DIAGNOSIS — Z85828 Personal history of other malignant neoplasm of skin: Secondary | ICD-10-CM | POA: Diagnosis not present

## 2023-06-01 DIAGNOSIS — I10 Essential (primary) hypertension: Secondary | ICD-10-CM | POA: Diagnosis not present

## 2023-06-01 DIAGNOSIS — N1831 Chronic kidney disease, stage 3a: Secondary | ICD-10-CM | POA: Diagnosis not present

## 2023-06-01 DIAGNOSIS — N401 Enlarged prostate with lower urinary tract symptoms: Secondary | ICD-10-CM | POA: Diagnosis not present

## 2023-06-24 DIAGNOSIS — K222 Esophageal obstruction: Secondary | ICD-10-CM | POA: Diagnosis not present

## 2023-06-24 DIAGNOSIS — R739 Hyperglycemia, unspecified: Secondary | ICD-10-CM | POA: Diagnosis not present

## 2023-06-24 DIAGNOSIS — E079 Disorder of thyroid, unspecified: Secondary | ICD-10-CM | POA: Diagnosis not present

## 2023-06-24 DIAGNOSIS — D72820 Lymphocytosis (symptomatic): Secondary | ICD-10-CM | POA: Diagnosis not present

## 2023-06-24 DIAGNOSIS — K9089 Other intestinal malabsorption: Secondary | ICD-10-CM | POA: Diagnosis not present

## 2023-06-24 DIAGNOSIS — Z1331 Encounter for screening for depression: Secondary | ICD-10-CM | POA: Diagnosis not present

## 2023-06-24 DIAGNOSIS — Z79899 Other long term (current) drug therapy: Secondary | ICD-10-CM | POA: Diagnosis not present

## 2023-06-24 DIAGNOSIS — N401 Enlarged prostate with lower urinary tract symptoms: Secondary | ICD-10-CM | POA: Diagnosis not present

## 2023-06-24 DIAGNOSIS — Z0000 Encounter for general adult medical examination without abnormal findings: Secondary | ICD-10-CM | POA: Diagnosis not present

## 2023-06-24 DIAGNOSIS — R251 Tremor, unspecified: Secondary | ICD-10-CM | POA: Diagnosis not present

## 2023-06-24 DIAGNOSIS — R1319 Other dysphagia: Secondary | ICD-10-CM | POA: Diagnosis not present

## 2023-06-24 DIAGNOSIS — I951 Orthostatic hypotension: Secondary | ICD-10-CM | POA: Diagnosis not present

## 2023-06-24 DIAGNOSIS — N1831 Chronic kidney disease, stage 3a: Secondary | ICD-10-CM | POA: Diagnosis not present

## 2023-06-24 DIAGNOSIS — K219 Gastro-esophageal reflux disease without esophagitis: Secondary | ICD-10-CM | POA: Diagnosis not present

## 2023-06-26 DIAGNOSIS — E079 Disorder of thyroid, unspecified: Secondary | ICD-10-CM | POA: Diagnosis not present

## 2023-06-26 DIAGNOSIS — E041 Nontoxic single thyroid nodule: Secondary | ICD-10-CM | POA: Diagnosis not present

## 2023-07-01 ENCOUNTER — Other Ambulatory Visit: Payer: Self-pay | Admitting: Internal Medicine

## 2023-07-01 DIAGNOSIS — I10 Essential (primary) hypertension: Secondary | ICD-10-CM | POA: Diagnosis not present

## 2023-07-01 DIAGNOSIS — E041 Nontoxic single thyroid nodule: Secondary | ICD-10-CM

## 2023-07-01 DIAGNOSIS — N1831 Chronic kidney disease, stage 3a: Secondary | ICD-10-CM | POA: Diagnosis not present

## 2023-07-01 DIAGNOSIS — N401 Enlarged prostate with lower urinary tract symptoms: Secondary | ICD-10-CM | POA: Diagnosis not present

## 2023-07-04 ENCOUNTER — Ambulatory Visit
Admission: RE | Admit: 2023-07-04 | Discharge: 2023-07-04 | Disposition: A | Discharge status: Home / Self Care | Source: Ambulatory Visit | Attending: Internal Medicine | Admitting: Internal Medicine

## 2023-07-04 ENCOUNTER — Other Ambulatory Visit (HOSPITAL_COMMUNITY)
Admission: RE | Admit: 2023-07-04 | Discharge: 2023-07-04 | Disposition: A | Discharge status: Home / Self Care | Source: Ambulatory Visit | Attending: Internal Medicine | Admitting: Internal Medicine

## 2023-07-04 ENCOUNTER — Other Ambulatory Visit

## 2023-07-04 DIAGNOSIS — E041 Nontoxic single thyroid nodule: Secondary | ICD-10-CM | POA: Insufficient documentation

## 2023-07-09 LAB — CYTOLOGY - NON PAP

## 2023-07-18 ENCOUNTER — Other Ambulatory Visit: Payer: Self-pay | Admitting: Internal Medicine

## 2023-07-18 DIAGNOSIS — E041 Nontoxic single thyroid nodule: Secondary | ICD-10-CM

## 2023-07-22 DIAGNOSIS — H401131 Primary open-angle glaucoma, bilateral, mild stage: Secondary | ICD-10-CM | POA: Diagnosis not present

## 2023-07-23 ENCOUNTER — Ambulatory Visit
Admission: RE | Admit: 2023-07-23 | Discharge: 2023-07-23 | Disposition: A | Discharge status: Home / Self Care | Source: Ambulatory Visit | Attending: Internal Medicine | Admitting: Internal Medicine

## 2023-07-23 ENCOUNTER — Other Ambulatory Visit (HOSPITAL_COMMUNITY)
Admission: RE | Admit: 2023-07-23 | Discharge: 2023-07-23 | Disposition: A | Discharge status: Home / Self Care | Source: Ambulatory Visit | Attending: Radiology | Admitting: Radiology

## 2023-07-23 DIAGNOSIS — E041 Nontoxic single thyroid nodule: Secondary | ICD-10-CM

## 2023-07-25 LAB — CYTOLOGY - NON PAP

## 2023-08-01 DIAGNOSIS — I10 Essential (primary) hypertension: Secondary | ICD-10-CM | POA: Diagnosis not present

## 2023-08-01 DIAGNOSIS — N1831 Chronic kidney disease, stage 3a: Secondary | ICD-10-CM | POA: Diagnosis not present

## 2023-08-01 DIAGNOSIS — N401 Enlarged prostate with lower urinary tract symptoms: Secondary | ICD-10-CM | POA: Diagnosis not present

## 2023-09-01 DIAGNOSIS — N1831 Chronic kidney disease, stage 3a: Secondary | ICD-10-CM | POA: Diagnosis not present

## 2023-09-01 DIAGNOSIS — N401 Enlarged prostate with lower urinary tract symptoms: Secondary | ICD-10-CM | POA: Diagnosis not present

## 2023-09-01 DIAGNOSIS — I10 Essential (primary) hypertension: Secondary | ICD-10-CM | POA: Diagnosis not present

## 2023-09-19 DIAGNOSIS — E041 Nontoxic single thyroid nodule: Secondary | ICD-10-CM | POA: Diagnosis not present

## 2023-09-25 DIAGNOSIS — R1310 Dysphagia, unspecified: Secondary | ICD-10-CM | POA: Diagnosis not present

## 2023-10-01 DIAGNOSIS — N1831 Chronic kidney disease, stage 3a: Secondary | ICD-10-CM | POA: Diagnosis not present

## 2023-10-01 DIAGNOSIS — I10 Essential (primary) hypertension: Secondary | ICD-10-CM | POA: Diagnosis not present

## 2023-10-01 DIAGNOSIS — N401 Enlarged prostate with lower urinary tract symptoms: Secondary | ICD-10-CM | POA: Diagnosis not present

## 2023-10-07 ENCOUNTER — Encounter: Payer: Self-pay | Admitting: Neurology

## 2023-10-07 ENCOUNTER — Ambulatory Visit: Admitting: Neurology

## 2023-10-07 VITALS — BP 179/86 | HR 74 | Ht 77.0 in | Wt 150.4 lb

## 2023-10-07 DIAGNOSIS — Z8679 Personal history of other diseases of the circulatory system: Secondary | ICD-10-CM

## 2023-10-07 DIAGNOSIS — G20C Parkinsonism, unspecified: Secondary | ICD-10-CM | POA: Diagnosis not present

## 2023-10-07 DIAGNOSIS — R030 Elevated blood-pressure reading, without diagnosis of hypertension: Secondary | ICD-10-CM | POA: Diagnosis not present

## 2023-10-07 MED ORDER — CARBIDOPA-LEVODOPA 25-100 MG PO TABS: 0.5000 | ORAL_TABLET | Freq: Three times a day (TID) | ORAL | 3 refills | Status: DC

## 2023-10-07 NOTE — Patient Instructions (Addendum)
 It was nice to meet you today.  I agree with your primary care physician, Dr. Charlott that you may have Parkinson's disease or some form of Parkinson's-like disease which we call parkinsonism.  Symptoms and signs are more noticeable on the left.  Please use your cane at all times and bring your walker for your doctors appointments.  As discussed, we will start you on a medication called Sinemet (generic name: carbidopa-levodopa) 25/100 mg: Take half a pill 3 times a day (7:30 AM, 11:30 AM, and 5 PM daily). Please remember to try to take the medication away from you mealtimes, that is, ideally either one hour before or 2 hours after your meal to ensure optimal absorption. The medication can interfere with the protein content of your meal and trying to the protein in your food and therefore not get fully absorbed.   Common side effects reported are: Dizziness, nausea, vomiting, sedation, confusion, lightheadedness. Rare side effects include hallucinations, severe nausea or vomiting, diarrhea and significant drop in blood pressure especially when going from lying to standing or from sitting to standing.  Since you already have a prior diagnosis of orthostatic hypotension, which means blood pressure drop upon standing, please be extra mindful and stand up slowly, get your bearings first, use your walker for assistance when needed.  We will plan a follow-up in about 3 months, please call us  or message us  through MyChart with any interim questions or concerns.  Please be cautious with your driving as you are fine motor skills are affected.

## 2023-10-07 NOTE — Progress Notes (Signed)
 Subjective:    Patient ID: Bobby [name] is a 83 y.o. male.  Dizziness    True Mar, MD, PhD [facility] [address] [address] [place], [place] [address]  Dear Dr. Charlott,  I saw your patient, Bobby [name], upon your kind request in my neurologic clinic today for initial consultation of his tremor.  The patient is unaccompanied today.  As you know, Bobby [name] is an 83 year old male with an underlying medical history of BPH, chronic kidney disease, reflux disease, esophageal stricture, orthostatic hypotension, lymphocytosis, history of thyroid  mass, diverticulosis, anxiety, hearing loss and tinnitus, who reports a several year history of tremors affecting primarily his left upper extremity but also his right upper extremity and occasionally in his left foot. He originally was told when he first started having this tremor about 5 or 6 years ago that he had essential tremor.  He has had worsening of his tremor.  No recent falls thankfully but uses a cane and also has a walker for longer distances.  He lives alone at friend's home, retirement community, independent living.  He has limited his driving.  For longer distances he does require help with driving.  His 2 daughters live out of state, 1 daughter in [place]  and the other in [place] .  His wife passed away.  He quit smoking over 45 years ago.  He does not drink any alcohol.  He limits his caffeine to about 2 cups of coffee in the mornings.  He has had loss of appetite and in the past 3 months has lost weight.  He denies loss of sense of smell or taste.  He does not have a family history of tremors as far as he knows, neither parent had tremors, he has an older brother and a younger sister, neither 1 with tremors.  He tries to hydrate well with water, he is working on it.  He has noticed some difficulty with his dexterity and posture has changed.  Tremor is bothersome to him. I reviewed your  office note from 06/24/2023.  He reported a worsening tremor at the time.  He reports that there tremor started in or around January 2020. He has been on fludrocortisone  for orthostatic hypotension.  He had blood work through your office including CBC with differential, B12, vitamin D , CMP, thyroid  function test.  I was able to review test results in his paper chart.  CBC showed hemoglobin below normal at 12.1, hematocrit below normal at 34, lymphocyte count elevated.  CMP showed blood sugar of 124, BUN 33, creatinine 1.22, sodium 143, potassium 3.9, total bilirubin 1.3.  Vitamin D  was 64, B12 was 684, TSH 0.94. He had an appointment in [facility] for which one of his daughters accompanied him.  He has seen general surgery recently through [facility] for his thyroid  mass.  No surgery has been planned.   His Past Medical History Is Significant For: Past Medical History:  Diagnosis Date   Anxiety    Diverticulosis    Orthostatic hypotension    Tinnitus     His Past Surgical History Is Significant For: Past Surgical History:  Procedure Laterality Date   BALLOON DILATION N/A 02/26/2012   Procedure: BALLOON DILATION;  Surgeon: Gladis MARLA Louder, MD;  Location: [facility];  Service: Endoscopy;  Laterality: N/A;   CHOLECYSTECTOMY N/A 05/03/2015   Procedure: LAPAROSCOPIC CHOLECYSTECTOMY WITH INTRAOPERATIVE CHOLANGIOGRAM , LAPAROSCOPIC GASTROTOMY  ;  Surgeon: Alm Angle, MD;  Location: [facility];  Service:  General;  Laterality: N/A;   ERCP N/A 05/03/2015   Procedure: ENDOSCOPIC RETROGRADE CHOLANGIOPANCREATOGRAPHY (ERCP);  Surgeon: Belvie Just, MD;  Location: [facility];  Service: Gastroenterology;  Laterality: N/A;   ESOPHAGOGASTRODUODENOSCOPY N/A 02/26/2012   Procedure: ESOPHAGOGASTRODUODENOSCOPY (EGD);  Surgeon: Gladis MARLA Louder, MD;  Location: [facility];  Service: Endoscopy;  Laterality: N/A;   ESOPHAGOGASTRODUODENOSCOPY (EGD) WITH PROPOFOL  N/A 05/02/2015   Procedure: ESOPHAGOGASTRODUODENOSCOPY  (EGD) WITH PROPOFOL ;  Surgeon: Elsie Cree, MD;  Location: [facility];  Service: Endoscopy;  Laterality: N/A;   TONSILLECTOMY      His Family History Is Significant For: Family History  Problem Relation Age of Onset   Cancer Mother    Breast cancer Mother    Stroke Mother    Diabetes Neg Hx    Hypertension Neg Hx    Tremor Neg Hx     His Social History Is Significant For: Social History   Socioeconomic History   Marital status: Married    Spouse name: Not on file   Number of children: Not on file   Years of education: Not on file   Highest education level: Not on file  Occupational History   Not on file  Tobacco Use   Smoking status: Former    Current packs/day: 0.00    Types: Cigarettes    Quit date: 1979    Years since quitting: 46.7   Smokeless tobacco: Never  Vaping Use   Vaping status: Never Used  Substance and Sexual Activity   Alcohol use: Yes    Alcohol/week: 3.0 standard drinks of alcohol    Types: 3 Shots of liquor per week    Comment: none   Drug use: No   Sexual activity: Not on file  Other Topics Concern   Not on file  Social History Narrative   Caffiene 2 cups daily   [facility]   Retired.     Social Drivers of Corporate investment banker Strain: Not on file  Food Insecurity: No Food Insecurity (10/06/2022)   Hunger Vital Sign    Worried About Running Out of Food in the Last Year: Never true    Ran Out of Food in the Last Year: Never true  Transportation Needs: No Transportation Needs (10/06/2022)   PRAPARE - Administrator, Civil Service (Medical): No    Lack of Transportation (Non-Medical): No  Physical Activity: Not on file  Stress: Not on file  Social Connections: Not on file    His Allergies Are:  No Known Allergies:   His Current Medications Are:  Outpatient Encounter Medications as of 10/07/2023  Medication Sig   acetaminophen  (TYLENOL ) 500 MG tablet Take 1,000-1,500 mg by mouth 2 (two) times daily.    cholecalciferol  (VITAMIN D ) 1000 units tablet Take 1,000 Units by mouth daily.   Multiple Vitamin (MULTIVITAMIN WITH MINERALS) TABS tablet Take 1 tablet by mouth daily.   omeprazole (PRILOSEC) 20 MG capsule Take 20 mg by mouth daily with breakfast.   potassium chloride  SA (KLOR-CON ) 20 MEQ tablet 40meg po twice daily for 2 days and then 40meq once daily thereafter. F/u with PCP in 2 week for rpt labs and further potassium adjustment   fludrocortisone  (FLORINEF ) 0.1 MG tablet Take 1 tablet (0.1 mg total) by mouth 2 (two) times daily.   No facility-administered encounter medications on file as of 10/07/2023.  :   Review of Systems:  Out of a complete 14 point review of systems, all are reviewed and negative with the  exception of these symptoms as listed below:   Review of Systems  Neurological:        L hand tremor several years ago.  Worsening, now R hand.      Objective:  Neurological Exam  Physical Exam Physical Examination:   Vitals:   10/07/23 1528  BP: (!) 179/86  Pulse: 74    General Examination: The patient is a very pleasant 83 y.o. male in no acute distress. He appears frail.  He is well-groomed.   HEENT: Normocephalic, atraumatic, pupils are equal, round and reactive to light, tracking is mildly impaired.  Face is symmetric with mild facial masking, mild to moderate nuchal rigidity noted.  He has bilateral hearing aids, hearing grossly intact.  He has an intermittent lower lip and jaw tremor.  Speech without dysarthria, mildly hypophonic perhaps.   Airway examination reveals mild mouth dryness, tongue protrudes centrally and palate elevates symmetrically.  No carotid bruits.    Chest: Clear to auscultation without wheezing, rhonchi or crackles noted.  Heart: S1+S2+0, regular and normal without murmurs, rubs or gallops noted.   Abdomen: Soft, non-tender and non-distended.  Extremities: There is no pitting edema in the distal lower extremities bilaterally.   Skin:  Warm and dry without trophic changes noted.   Musculoskeletal: exam reveals no obvious joint deformities, mildly arthritic changes in both hands, possible mild Dupuytren's contracture left hand, stooped posture, increased curvature in the upper back.   Neurologically:  Mental status: The patient is awake, alert and oriented in all 4 spheres. His immediate and remote memory, attention, language skills and fund of knowledge are appropriate. There is no evidence of aphasia, agnosia, apraxia or anomia. Speech is clear with normal prosody and enunciation. Thought process is linear. Mood is normal and affect is normal.  Cranial nerves II - XII are as described above under HEENT exam.  Motor exam: Thin bulk, global strength of about 4+ out of 5, no focal weakness or atrophy noted.  He has a mild to moderate resting tremor in the left upper extremity, mild intermittent resting tremor in the right upper extremity, minimal intermittent resting tremor in the left lower extremity.  On 10/07/2023: On Archimedes spiral drawing he has a coarse moderate tremor with the left hand, minimal tremor with the right hand, handwriting with the right hand is mildly tremulous, legible, on the smaller side.  He has very minimal postural and action tremor in both upper extremities, no intention tremor. Fine motor skills and coordination: Mild to moderately impaired with finger taps, hand movements and rapid alternating patterning with both upper extremities, left side slightly worse compared to right.  Mild to moderate impairment of foot taps, left side worse than right.  Mild decrement in amplitude noted.   Reflexes are 1+ in the upper extremities and absent in the lower extremities.   Romberg is not tested for safety concerns.    Cerebellar testing: No dysmetria or intention tremor. There is no truncal or gait ataxia.  Sensory exam: intact to light touch in the upper and lower extremities.  Gait, station and balance: He stands  with mild difficulty, does not require assistance.  His posture is moderately stooped with increased thoracic kyphosis noted.  He walks with decreased stride length and pace, decreased arm swing bilaterally and more prominent tremor in the left upper extremity.  He turns slowly.  He brought a single-point cane but is able to walk some without it.  Assessment and Plan:  In summary, Gilmar Bua is  a very pleasant 83 y.o.-year old male with an underlying medical history of BPH, chronic kidney disease, reflux disease, esophageal stricture, orthostatic hypotension, lymphocytosis, history of thyroid  mass, diverticulosis, anxiety, hearing loss and tinnitus, who presents for evaluation of his tremor disorder of at least 5 or 6 years duration with progression noted and left-sided predominance.  History and examination, and description of his symptoms are in keeping with parkinsonism, possibly idiopathic left sided predominant Parkinson's disease but atypical parkinsonism not fully excluded, especially in light of his history of orthostatic hypotension.  Multisystem atrophy comes to mind in that regard.  I had a long discussion with the patient today, he is agreeable to starting symptomatic medication but we will be cautious given his history of OH.  I would like to suggest low-dose levodopa starting at 25-100 mg strength half a pill 3 times daily.  We talked about common side effects, expectations and limitation of this medication.  We talked about the importance of maintaining a healthy lifestyle, good nutrition, good hydration with water, fall prevention as well.  He is advised to take the medication an hour before his mealtimes for better absorption.   This was an extended visit of over 60 minutes with copious record review involved including paper record review in electronic chart review, and considerable counseling and coordination of care.   He is reminded to be cautious with his driving.  He was advised to see  if one of his daughters could accompany him to his next appointment, about 3 months from now.   He was given detailed instructions verbally during this visit and also in his electronic MyChart after visit summary which he can access electronically as he reassured me. I answered all of his questions today and he was in agreement.   Thank you very much for allowing me to participate in the care of this nice patient. If I can be of any further assistance to you please do not hesitate to call me at [phone].  Sincerely,   True Mar, MD, PhD

## 2023-10-17 DIAGNOSIS — Z23 Encounter for immunization: Secondary | ICD-10-CM | POA: Diagnosis not present

## 2023-11-01 DIAGNOSIS — N401 Enlarged prostate with lower urinary tract symptoms: Secondary | ICD-10-CM | POA: Diagnosis not present

## 2023-11-01 DIAGNOSIS — I10 Essential (primary) hypertension: Secondary | ICD-10-CM | POA: Diagnosis not present

## 2023-11-01 DIAGNOSIS — N1831 Chronic kidney disease, stage 3a: Secondary | ICD-10-CM | POA: Diagnosis not present

## 2023-11-18 DIAGNOSIS — K2289 Other specified disease of esophagus: Secondary | ICD-10-CM | POA: Diagnosis not present

## 2023-11-18 DIAGNOSIS — K219 Gastro-esophageal reflux disease without esophagitis: Secondary | ICD-10-CM | POA: Diagnosis not present

## 2023-11-18 DIAGNOSIS — I129 Hypertensive chronic kidney disease with stage 1 through stage 4 chronic kidney disease, or unspecified chronic kidney disease: Secondary | ICD-10-CM | POA: Diagnosis not present

## 2023-11-18 DIAGNOSIS — Q438 Other specified congenital malformations of intestine: Secondary | ICD-10-CM | POA: Diagnosis not present

## 2023-11-18 DIAGNOSIS — D472 Monoclonal gammopathy: Secondary | ICD-10-CM | POA: Diagnosis not present

## 2023-11-18 DIAGNOSIS — K449 Diaphragmatic hernia without obstruction or gangrene: Secondary | ICD-10-CM | POA: Diagnosis not present

## 2023-11-18 DIAGNOSIS — N189 Chronic kidney disease, unspecified: Secondary | ICD-10-CM | POA: Diagnosis not present

## 2023-11-18 DIAGNOSIS — Q398 Other congenital malformations of esophagus: Secondary | ICD-10-CM | POA: Diagnosis not present

## 2023-11-18 DIAGNOSIS — K222 Esophageal obstruction: Secondary | ICD-10-CM | POA: Diagnosis not present

## 2023-11-22 DIAGNOSIS — Z23 Encounter for immunization: Secondary | ICD-10-CM | POA: Diagnosis not present

## 2023-12-01 DIAGNOSIS — N401 Enlarged prostate with lower urinary tract symptoms: Secondary | ICD-10-CM | POA: Diagnosis not present

## 2023-12-01 DIAGNOSIS — I10 Essential (primary) hypertension: Secondary | ICD-10-CM | POA: Diagnosis not present

## 2023-12-01 DIAGNOSIS — N1831 Chronic kidney disease, stage 3a: Secondary | ICD-10-CM | POA: Diagnosis not present

## 2023-12-16 DIAGNOSIS — G20C Parkinsonism, unspecified: Secondary | ICD-10-CM | POA: Diagnosis not present

## 2023-12-16 DIAGNOSIS — K219 Gastro-esophageal reflux disease without esophagitis: Secondary | ICD-10-CM | POA: Diagnosis not present

## 2023-12-16 DIAGNOSIS — K9089 Other intestinal malabsorption: Secondary | ICD-10-CM | POA: Diagnosis not present

## 2023-12-16 DIAGNOSIS — E041 Nontoxic single thyroid nodule: Secondary | ICD-10-CM | POA: Diagnosis not present

## 2023-12-16 DIAGNOSIS — N401 Enlarged prostate with lower urinary tract symptoms: Secondary | ICD-10-CM | POA: Diagnosis not present

## 2023-12-16 DIAGNOSIS — R1319 Other dysphagia: Secondary | ICD-10-CM | POA: Diagnosis not present

## 2023-12-16 DIAGNOSIS — R1312 Dysphagia, oropharyngeal phase: Secondary | ICD-10-CM | POA: Diagnosis not present

## 2023-12-16 DIAGNOSIS — N1831 Chronic kidney disease, stage 3a: Secondary | ICD-10-CM | POA: Diagnosis not present

## 2023-12-16 DIAGNOSIS — K222 Esophageal obstruction: Secondary | ICD-10-CM | POA: Diagnosis not present

## 2023-12-16 DIAGNOSIS — I951 Orthostatic hypotension: Secondary | ICD-10-CM | POA: Diagnosis not present

## 2023-12-16 DIAGNOSIS — D72820 Lymphocytosis (symptomatic): Secondary | ICD-10-CM | POA: Diagnosis not present

## 2023-12-16 DIAGNOSIS — R251 Tremor, unspecified: Secondary | ICD-10-CM | POA: Diagnosis not present

## 2024-02-03 ENCOUNTER — Other Ambulatory Visit: Payer: Self-pay | Admitting: Neurology

## 2024-02-03 DIAGNOSIS — R030 Elevated blood-pressure reading, without diagnosis of hypertension: Secondary | ICD-10-CM

## 2024-02-03 DIAGNOSIS — Z8679 Personal history of other diseases of the circulatory system: Secondary | ICD-10-CM

## 2024-02-03 DIAGNOSIS — G20C Parkinsonism, unspecified: Secondary | ICD-10-CM

## 2024-02-05 ENCOUNTER — Encounter: Payer: Self-pay | Admitting: Neurology

## 2024-02-05 ENCOUNTER — Ambulatory Visit: Admitting: Neurology

## 2024-02-05 VITALS — BP 137/79 | HR 78 | Ht 77.0 in | Wt 147.2 lb

## 2024-02-05 DIAGNOSIS — Z8679 Personal history of other diseases of the circulatory system: Secondary | ICD-10-CM | POA: Diagnosis not present

## 2024-02-05 DIAGNOSIS — R030 Elevated blood-pressure reading, without diagnosis of hypertension: Secondary | ICD-10-CM

## 2024-02-05 DIAGNOSIS — I499 Cardiac arrhythmia, unspecified: Secondary | ICD-10-CM

## 2024-02-05 DIAGNOSIS — R1310 Dysphagia, unspecified: Secondary | ICD-10-CM | POA: Diagnosis not present

## 2024-02-05 DIAGNOSIS — G20C Parkinsonism, unspecified: Secondary | ICD-10-CM | POA: Diagnosis not present

## 2024-02-05 DIAGNOSIS — R5383 Other fatigue: Secondary | ICD-10-CM | POA: Diagnosis not present

## 2024-02-05 MED ORDER — CARBIDOPA-LEVODOPA 25-100 MG PO TABS: 0.5000 | ORAL_TABLET | Freq: Three times a day (TID) | ORAL | 1 refills | Status: AC

## 2024-02-05 NOTE — Patient Instructions (Signed)
 Please use your walker at all times. Please look into getting a call alert button. Follow-up with your primary care today or get in touch today to make an appointment to discuss irregular heartbeat and to get a formal EKG done, and discuss further evaluation with cardiology if need be. Increase your water intake to about 3-4 bottles of water per day, 16.9 ounce size each. Please look into getting compression knee-high socks to help with blood pressure drops and to maintain blood pressure in a more normal range. Follow-up in this clinic in about 4 months, continue with levodopa  half a pill 3 times daily.

## 2024-02-05 NOTE — Progress Notes (Addendum)
 Subjective:    Patient ID: Bobby [name] is a 84 y.o. male.  HPI    Interim history:   Bobby [name] is an 84 year old male with an underlying medical history of BPH, chronic kidney disease, reflux disease, esophageal stricture, orthostatic hypotension, lymphocytosis, history of thyroid  mass, diverticulosis, anxiety, hearing loss and tinnitus, who presents for follow-up consultation of Bobby parkinsonism.  The patient is accompanied by Bobby sister, Bobby [name], today.  I first met him at the request of Bobby [name] in October 2025, at which time he reported a several year history of tremor affecting primarily Bobby left upper extremity.  History and examination were in keeping with parkinsonism with left-sided predominance noted.  He had a history of orthostatic hypotension and atypical parkinsonism was within the differential diagnosis.  He was advised to start low-dose levodopa , 25-100 mg strength half a pill 3 times daily.  Today, 02/05/2024: He reports having had more lightheadedness since starting levodopa  therapy, 1 time he had a near collapse in the kitchen, did not fall to the ground, was not using Bobby walker at the time.  He tries to hydrate well, estimates that he drinks about 2-3 bottles of water per day, 16.9 ounce size each.  He is in physical therapy at friend[facility] home and had evaluation with speech therapy as well.  He has had a few episodes of dysphagia which then causes him to not eat anymore for the day or most of the day afterwards because of irritation.  He has had episodes of coughing and food feeling stuck in Bobby throat.  He is scheduled for a swallow study and evaluation with speech therapy outpatient on 02/19/2024.  He has called buttons in a couple of areas of Bobby apartment at friend's home but not a mobile call alert button.  He is currently not driving.  Bobby [name] also states at friend's home and will be taking him to Bobby swallow study appointment.  He is not sure if the  levodopa  has helped.  He is taking half a pill 3 times daily, typically around 7 or 7:30 AM, 11:30 AM and 5 PM daily, tries to take the medicine about an hour before eating. He reports trouble sleeping and feeling more tired, difficulty waking up in the mornings.  He has aching at night, has to get up frequently to urinate, none of these are very acute or recent problems but the tiredness is worse.  The patient's allergies, current medications, family history, past medical history, past social history, past surgical history and problem list were reviewed and updated as appropriate.   Previously:  10/07/2023: (He) reports a several year history of tremors affecting primarily Bobby left upper extremity but also Bobby right upper extremity and occasionally in Bobby left foot. He originally was told when he first started having this tremor about 5 or 6 years ago that he had essential tremor.  He has had worsening of Bobby tremor.  No recent falls thankfully but uses a cane and also has a walker for longer distances.  He lives alone at [facility] home, retirement community, independent living.  He has limited Bobby driving.  For longer distances he does require help with driving.  Bobby 2 [name] live out of state, 1 daughter in [place]  and the other in [place] .  Bobby [name] passed away.  He quit smoking over 45 years ago.  He does not drink any alcohol.  He limits Bobby caffeine to about 2 cups of coffee in the  mornings.  He has had loss of appetite and in the past 3 months has lost weight.  He denies loss of sense of smell or taste.  He does not have a family history of tremors as far as he knows, neither parent had tremors, he has an older [name] and a younger sister, neither 1 with tremors.  He tries to hydrate well with water, he is working on it.  He has noticed some difficulty with Bobby dexterity and posture has changed.  Tremor is bothersome to him. I reviewed your office note from 06/24/2023.  He reported  a worsening tremor at the time.  He reports that there tremor started in or around January 2020. He has been on fludrocortisone  for orthostatic hypotension.  He had blood work through your office including CBC with differential, B12, vitamin D , CMP, thyroid  function test.  I was able to review test results in Bobby paper chart.  CBC showed hemoglobin below normal at 12.1, hematocrit below normal at 34, lymphocyte count elevated.  CMP showed blood sugar of 124, BUN 33, creatinine 1.22, sodium 143, potassium 3.9, total bilirubin 1.3.  Vitamin D  was 64, B12 was 684, TSH 0.94. He had an appointment in [facility] for which Bobby [name] accompanied him.  He has seen general surgery recently through [facility] for Bobby thyroid  mass.  No surgery has been planned.  Bobby Past Medical History Is Significant For: Past Medical History:  Diagnosis Date   Anxiety    Diverticulosis    Orthostatic hypotension    Tinnitus     Bobby Past Surgical History Is Significant For: Past Surgical History:  Procedure Laterality Date   BALLOON DILATION N/A 02/26/2012   Procedure: BALLOON DILATION;  Surgeon: Gladis MARLA Louder, MD;  Location: [facility];  Service: Endoscopy;  Laterality: N/A;   BALLOON DILATION     09/2023   CHOLECYSTECTOMY N/A 05/03/2015   Procedure: LAPAROSCOPIC CHOLECYSTECTOMY WITH INTRAOPERATIVE CHOLANGIOGRAM , LAPAROSCOPIC GASTROTOMY  ;  Surgeon: Alm Angle, MD;  Location: [facility];  Service: General;  Laterality: N/A;   ERCP N/A 05/03/2015   Procedure: ENDOSCOPIC RETROGRADE CHOLANGIOPANCREATOGRAPHY (ERCP);  Surgeon: Belvie Just, MD;  Location: [facility];  Service: Gastroenterology;  Laterality: N/A;   ESOPHAGOGASTRODUODENOSCOPY N/A 02/26/2012   Procedure: ESOPHAGOGASTRODUODENOSCOPY (EGD);  Surgeon: Gladis MARLA Louder, MD;  Location: [facility];  Service: Endoscopy;  Laterality: N/A;   ESOPHAGOGASTRODUODENOSCOPY (EGD) WITH PROPOFOL  N/A 05/02/2015   Procedure: ESOPHAGOGASTRODUODENOSCOPY  (EGD) WITH PROPOFOL ;  Surgeon: Elsie Cree, MD;  Location: [facility];  Service: Endoscopy;  Laterality: N/A;   TONSILLECTOMY      Bobby Family History Is Significant For: Family History  Problem Relation Age of Onset   Cancer Mother    Breast cancer Mother    Stroke Mother    Diabetes Neg Hx    Hypertension Neg Hx    Tremor Neg Hx    Parkinson's disease Neg Hx     Bobby Social History Is Significant For: Social History   Socioeconomic History   Marital status: Married    Spouse name: Not on file   Number of children: Not on file   Years of education: Not on file   Highest education level: Not on file  Occupational History   Not on file  Tobacco Use   Smoking status: Former    Current packs/day: 0.00    Types: Cigarettes    Quit date: 1979    Years since quitting: 47.1   Smokeless tobacco: Never  Vaping Use   Vaping status: Never Used  Substance and Sexual Activity   Alcohol use: Yes    Alcohol/week: 3.0 standard drinks of alcohol    Types: 3 Shots of liquor per week    Comment: none   Drug use: No   Sexual activity: Not on file  Other Topics Concern   Not on file  Social History Narrative   Caffiene 2 cups daily   [facility]   Retired.     Social Drivers of Health   Tobacco Use: Medium Risk (02/05/2024)   Patient History    Smoking Tobacco Use: Former    Smokeless Tobacco Use: Never    Passive Exposure: Not on file  Financial Resource Strain: Not on file  Food Insecurity: No Food Insecurity (10/06/2022)   Hunger Vital Sign    Worried About Running Out of Food in the Last Year: Never true    Ran Out of Food in the Last Year: Never true  Transportation Needs: No Transportation Needs (10/06/2022)   PRAPARE - Administrator, Civil Service (Medical): No    Lack of Transportation (Non-Medical): No  Physical Activity: Not on file  Stress: Not on file  Social Connections: Not on file  Depression (EYV7-0): Not on file  Alcohol Screen: Not  on file  Housing: Unknown (09/19/2023)   Received from [facility]   Epic    Unable to Pay for Housing in the Last Year: Not on file    Number of Times Moved in the Last Year: Not on file    At any time in the past 12 months, were you homeless or living in a shelter (including now)?: No  Utilities: Not At Risk (10/06/2022)   [facility] Utilities    Threatened with loss of utilities: No  Health Literacy: Not on file    Bobby Allergies Are:  Allergies[1]:   Bobby Current Medications Are:  Outpatient Encounter Medications as of 02/05/2024  Medication Sig   acetaminophen  (TYLENOL ) 500 MG tablet Take 1,000-1,500 mg by mouth 2 (two) times daily.   carbidopa -levodopa  (SINEMET  IR) 25-100 MG tablet TAKE 0.5 TABLETS BY MOUTH 3 (THREE) TIMES DAILY. TAKE AT 7:30 AM, 11:30 AM, AND 5 PM DAILY. MAY CAUSE DIZZINESS.   cholecalciferol  (VITAMIN D ) 1000 units tablet Take 1,000 Units by mouth daily.   Multiple Vitamin (MULTIVITAMIN WITH MINERALS) TABS tablet Take 1 tablet by mouth daily.   omeprazole (PRILOSEC) 20 MG capsule Take 20 mg by mouth daily with breakfast.   potassium chloride  SA (KLOR-CON ) 20 MEQ tablet 40meg po twice daily for 2 days and then 40meq once daily thereafter. F/u with PCP in 2 week for rpt labs and further potassium adjustment   fludrocortisone  (FLORINEF ) 0.1 MG tablet Take 1 tablet (0.1 mg total) by mouth 2 (two) times daily.   No facility-administered encounter medications on file as of 02/05/2024.  :  Review of Systems:  Out of a complete 14 point review of systems, all are reviewed and negative with the exception of these symptoms as listed below:   Review of Systems  Objective:  Neurological Exam  Physical Exam Physical Examination:   Vitals:   02/05/24 1116  BP: 137/79  Pulse: 78    General Examination: The patient is a very pleasant 84 y.o. male in no acute distress. He appears thin and frail.  Well-groomed.    HEENT: Normocephalic, atraumatic, tracking is  impaired, face is symmetric with mild facial masking, mild nuchal rigidity.  He has hearing aids, hearing grossly intact.  Speech without dysarthria, mild hypophonia noted.  No carotid bruits.   Chest: Clear to auscultation without wheezing, rhonchi or crackles noted.   Heart: S1+S2+0, mildly irregular, unsure if it is irregularly irregular, could be frequent PVCs with pauses.     Abdomen: Soft, non-tender and non-distended.   Extremities: There is no pitting edema in the distal lower extremities bilaterally.    Skin: Warm and dry without trophic changes noted.    Musculoskeletal: exam reveals no obvious joint deformities, mildly arthritic changes in both hands, possible mild Dupuytren's contracture left hand, significantly stooped posture, increased curvature in the upper back.    Neurologically:  Mental status: The patient is awake, alert and oriented in all 4 spheres. Bobby immediate and remote memory, attention, language skills and fund of knowledge are appropriate. There is no evidence of aphasia, agnosia, apraxia or anomia. Speech is clear with normal prosody and enunciation. Thought process is linear. Mood is normal and affect is normal.  Cranial nerves II - XII are as described above under HEENT exam.  Motor exam: Thin bulk, no focal weakness.  No focal atrophy.  He has a resting tremor in the left more than right upper extremity, fairly consistent and mild to moderate on the left.  He has an intermittent resting tremor in the left lower extremity as well. Fine motor skills are globally impaired.    (On 10/07/2023: On Archimedes spiral drawing he has a coarse moderate tremor with the left hand, minimal tremor with the right hand, handwriting with the right hand is mildly tremulous, legible, on the smaller side.)   He has an action tremor in both upper extremities.    Romberg is not tested for safety concerns.     Cerebellar testing: No dysmetria or intention tremor. There is no truncal  or gait ataxia.   Sensory exam: intact to light touch in the upper and lower extremities.   Gait, station and balance: He stands with mild difficulty, and pushes himself up but does not require assistance.  Bobby posture is moderately to significantly stooped with increased thoracic and lumbar kyphosis noted.  He walks with decreased stride length and pace, using Bobby rolling walker.   Assessment and Plan:  In summary, Ante Arredondo is an 84 year old male with an underlying medical history of BPH, chronic kidney disease, reflux disease, esophageal stricture, orthostatic hypotension, lymphocytosis, history of thyroid  mass, diverticulosis, anxiety, hearing loss and tinnitus, who presents for follow-up consultation of Bobby parkinsonism of several years duration with symptoms starting on the left side and left-sided predominance noted on exam.  He has a longstanding history of orthostatic hypotension and has been on Florinef  for years.  He is noted to have a mildly irregular heartbeat and is advised to follow-up with Bobby PCP if possible today for a formal EKG and consideration of evaluation with cardiology if need be.  He does not actually have any symptoms from the heart standpoint, has not noticed any irregular heartbeat himself or symptoms of shortness of breath or chest pain.  He does have orthostatic lightheadedness.  He is advised to use Bobby walker at all times.  He is advised to increase Bobby water intake and consider wearing compression stockings. Below is a summary of my recommendations and our discussion points from today's visit, based on chart review, history and examination. They were given these instructions verbally during the visit in detail and also in writing in the MyChart after visit summary (AVS),  which they can access electronically. << Please use your walker at all times. Please look into getting a call alert button. Follow-up with your primary care today or get in touch today to make an  appointment to discuss irregular heartbeat and to get a formal EKG done, and discuss further evaluation with cardiology if need be. Increase your water intake to about 3-4 bottles of water per day, 16.9 ounce size each. Please look into getting compression knee-high socks to help with blood pressure drops and to maintain blood pressure in a more normal range. Follow-up in this clinic in about 4 months, continue with levodopa  half a pill 3 times daily.>>   I answered all their questions today and the patient and Bobby sister were in agreement. I spent 40 minutes in total face-to-face time and in reviewing records during pre-charting, more than 50% of which was spent in counseling and coordination of care, reviewing test results, reviewing medications and treatment regimen and/or in discussing or reviewing the diagnosis of parkinsonism, orthostatic hypotension, irregular heartbeat, the prognosis and treatment options. Pertinent laboratory and imaging test results that were available during this visit with the patient were reviewed by me and considered in my medical decision making (see chart for details).        [1] No Known Allergies

## 2024-02-06 ENCOUNTER — Ambulatory Visit: Admitting: Neurology

## 2024-02-19 ENCOUNTER — Ambulatory Visit: Admitting: Speech Pathology

## 2024-03-09 ENCOUNTER — Ambulatory Visit: Admitting: Cardiology

## 2024-06-10 ENCOUNTER — Ambulatory Visit: Admitting: Neurology
